# Patient Record
Sex: Male | Born: 1960 | Race: White | Hispanic: No | Smoking: Former smoker
Health system: Southern US, Community
[De-identification: ages and names within clinical notes are randomized; demographics above are authoritative.]

## PROBLEM LIST (undated history)

## (undated) DIAGNOSIS — E1142 Type 2 diabetes mellitus with diabetic polyneuropathy: Secondary | ICD-10-CM

## (undated) DIAGNOSIS — E119 Type 2 diabetes mellitus without complications: Secondary | ICD-10-CM

## (undated) DIAGNOSIS — I10 Essential (primary) hypertension: Secondary | ICD-10-CM

## (undated) DIAGNOSIS — I639 Cerebral infarction, unspecified: Secondary | ICD-10-CM

## (undated) DIAGNOSIS — I2510 Atherosclerotic heart disease of native coronary artery without angina pectoris: Secondary | ICD-10-CM

## (undated) DIAGNOSIS — E11319 Type 2 diabetes mellitus with unspecified diabetic retinopathy without macular edema: Secondary | ICD-10-CM

## (undated) DIAGNOSIS — E785 Hyperlipidemia, unspecified: Secondary | ICD-10-CM

## (undated) HISTORY — DX: Type 2 diabetes mellitus with unspecified diabetic retinopathy without macular edema: E11.319

## (undated) HISTORY — DX: Type 2 diabetes mellitus with diabetic polyneuropathy: E11.42

## (undated) HISTORY — PX: EYE SURGERY: SHX253

## (undated) HISTORY — PX: KNEE SURGERY: SHX244

## (undated) HISTORY — PX: CARDIAC SURGERY: SHX584

## (undated) HISTORY — DX: Hyperlipidemia, unspecified: E78.5

## (undated) HISTORY — DX: Essential (primary) hypertension: I10

## (undated) HISTORY — PX: CORONARY ARTERY BYPASS GRAFT: SHX141

## (undated) HISTORY — DX: Type 2 diabetes mellitus without complications: E11.9

## (undated) HISTORY — PX: TONSILLECTOMY: SUR1361

## (undated) HISTORY — DX: Cerebral infarction, unspecified: I63.9

---

## 1998-08-22 ENCOUNTER — Encounter: Admission: RE | Admit: 1998-08-22 | Discharge: 1998-11-20 | Payer: Self-pay | Admitting: Endocrinology

## 2010-02-23 ENCOUNTER — Encounter: Admission: RE | Admit: 2010-02-23 | Discharge: 2010-02-23 | Payer: Self-pay | Admitting: Family Medicine

## 2010-11-14 ENCOUNTER — Encounter: Payer: Self-pay | Admitting: Emergency Medicine

## 2010-11-14 ENCOUNTER — Inpatient Hospital Stay (INDEPENDENT_AMBULATORY_CARE_PROVIDER_SITE_OTHER)
Admission: RE | Admit: 2010-11-14 | Discharge: 2010-11-14 | Disposition: A | Discharge status: Home / Self Care | Payer: 59 | Source: Ambulatory Visit | Attending: Emergency Medicine | Admitting: Emergency Medicine

## 2010-11-14 DIAGNOSIS — L02519 Cutaneous abscess of unspecified hand: Secondary | ICD-10-CM

## 2010-11-14 DIAGNOSIS — E119 Type 2 diabetes mellitus without complications: Secondary | ICD-10-CM | POA: Insufficient documentation

## 2010-11-14 DIAGNOSIS — L03119 Cellulitis of unspecified part of limb: Secondary | ICD-10-CM

## 2010-11-14 DIAGNOSIS — E109 Type 1 diabetes mellitus without complications: Secondary | ICD-10-CM | POA: Insufficient documentation

## 2010-11-17 ENCOUNTER — Telehealth (INDEPENDENT_AMBULATORY_CARE_PROVIDER_SITE_OTHER): Payer: Self-pay

## 2011-03-11 NOTE — Telephone Encounter (Signed)
  Phone Note Outgoing Call   Call placed by: Linton Flemings RN,  November 17, 2010 3:34 PM Call placed to: Patient Summary of Call: f/u call w/out answe; msg left to call facility with questions/concerns

## 2011-03-11 NOTE — Progress Notes (Signed)
Summary: ? INFX IN FINGER   Vital Signs:  Patient Profile:   50 Years Old Male CC:      LT hand injury x 1 day ago Weight:      181.75 pounds O2 Sat:      95 % O2 treatment:    Room Air Temp:     98.8 degrees F oral Pulse rate:   93 / minute Resp:     18 per minute BP sitting:   158 / 77  (left arm) Cuff size:   regular  Vitals Entered By: Clemens Catholic LPN (November 14, 2010 7:47 PM)                  Updated Prior Medication List: HUMULIN 70/30 70-30 % SUSP (INSULIN ISOPHANE & REGULAR)   Current Allergies: ! CODEINE ! PENICILLINHistory of Present Illness History from: patient Chief Complaint: LT hand injury x 1 day ago History of Present Illness: Pt complains of  L hand pain. Accidentally cut tiny area on back of L hand on a corner of intact piece of glass yesterday. Hand became swollen, red, sore, slight yellow drainage today. No fever or n or vom. Location: dorsum L hand Onset:yesterday Description/Quality of Pain:mild , dull  Trauma: see above.  Last tetanus shot 4 months ago. No numbness.  PMH : DM, controlled. Recent CBG today fasting 135.   REVIEW OF SYSTEMS Constitutional Symptoms      Denies fever, chills, night sweats, weight loss, weight gain, and fatigue.  Eyes       Complains of eye surgery.      Denies change in vision, eye pain, eye discharge, glasses, and contact lenses. Ear/Nose/Throat/Mouth       Denies hearing loss/aids, change in hearing, ear pain, ear discharge, dizziness, frequent runny nose, frequent nose bleeds, sinus problems, sore throat, hoarseness, and tooth pain or bleeding.  Respiratory       Denies dry cough, productive cough, wheezing, shortness of breath, asthma, bronchitis, and emphysema/COPD.  Cardiovascular       Denies murmurs, chest pain, and tires easily with exhertion.    Gastrointestinal       Denies stomach pain, nausea/vomiting, diarrhea, constipation, blood in bowel movements, and indigestion. Genitourniary  Denies painful urination, kidney stones, and loss of urinary control. Neurological       Denies paralysis, seizures, and fainting/blackouts. Musculoskeletal       Denies muscle pain, joint pain, joint stiffness, decreased range of motion, redness, swelling, muscle weakness, and gout.  Skin       Denies bruising, unusual mles/lumps or sores, and hair/skin or nail changes.  Psych       Denies mood changes, temper/anger issues, anxiety/stress, speech problems, depression, and sleep problems. Other Comments: pt states that he cut his LT hand on a piece of glass yesterday. today it is swollen, oozing and red. fasting blood sugar this morning per pt 135.   Past History:  Past Medical History: Diabetes mellitus, type II  Past Surgical History: RT eye surgery 11/11'  Family History: none  Social History: Never Smoked Alcohol use-no Drug use-no Smoking Status:  never Drug Use:  no Physical Exam General appearance: well developed, well nourished, no acute distress Head: normocephalic, atraumatic Oral/Pharynx: tongue normal, posterior pharynx without erythema or exudate Chest/Lungs: no rales, wheezes, or rhonchi bilateral, breath sounds equal without effort Heart: regular rate and  rhythm, no murmur Neurological: grossly intact and non-focal L hand: moderately swollen, red, indurated 3 x 3 cm  arae without fluctuence. Tiny, healing puncture wound. in center of area. No significant ttp. No fb seen or palpated. No drainage. No bleeding. No bony ttp. No red streaks. Distally, neurovacs and cap rf normal. No edema of fingers.  Assessment New Problems: CELLULITIS, HAND, LEFT (ICD-682.4) DIABETES MELLITUS, TYPE II (ICD-250.00)  No evidence of FB or abcess or vascular compromise. Given hx DM, will tx aggressively with 2 abx's with close f/u with his pcp.  Plan New Medications/Changes: CIPRO 500 MG TABS (CIPROFLOXACIN HCL) 1 by mouth two times a day for 10 days  #20 x 0, 11/14/2010, Lajean Manes MD DOXYCYCLINE HYCLATE 100 MG CAPS (DOXYCYCLINE HYCLATE) 1 by mouth two times a day for 10 days  #20 x 0, 11/14/2010, Lajean Manes MD  New Orders: Est. Patient Level III [phone] Services provided After hours-Weekends-Holidays [99051] Planning Comments:   Discussed dx and tx at length with pt. Option of IM Rocephin discussed, with risks, given hx of PCN allergy. Risks, benefits, alternatives discussed. Pt refused Rocephin IM. He agreed to the 2 abx's as prescribed. Wound area cleaned with antimicrobial soap, dressing appilied. Wound care instructed. 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  Follow Up: Follow up with Primary Physician Follow Up: 1-2 days. (I Explained risks if he were to delay f/u)  The patient and/or caregiver has been counseled thoroughly with regard to medications prescribed including dosage, schedule, interactions, rationale for use, and possible side effects and they verbalize understanding.  Diagnoses and expected course of recovery discussed and will return if not improved as expected or if the condition worsens. Patient and/or caregiver verbalized understanding.  Prescriptions: CIPRO 500 MG TABS (CIPROFLOXACIN HCL) 1 by mouth two times a day for 10 days  #20 x 0   Entered and Authorized by:   Lajean Manes MD   Signed by:   Lajean Manes MD on 11/14/2010   Method used:   Electronically to        [facility] [phone]* (retail)       [address] [place], [place]  [address]       Ph: [phone]       Fax: [phone]   RxID:   [id number] DOXYCYCLINE HYCLATE 100 MG CAPS (DOXYCYCLINE HYCLATE) 1 by mouth two times a day for 10 days  #20 x 0   Entered and Authorized by:   Lajean Manes MD   Signed by:   Lajean Manes MD on 11/14/2010   Method used:   Electronically to        [facility] [phone]* (retail)       [address] [place], [place]  [address]       Ph: [phone]       Fax: [phone]   RxID:   [id number]   Orders  Added: 1)  Est. Patient Level III [10932] 2)  Services provided After hours-Weekends-Holidays [[id number]]

## 2011-09-23 IMAGING — CR DG FOOT COMPLETE 3+V*R*
3 series · 3 of 3 positions shown · non-contrast
Comparison: None.

CLINICAL DATA: Lateral foot pain.

RIGHT FOOT COMPLETE - 3+ VIEW

[view not recorded (1 of 3)]
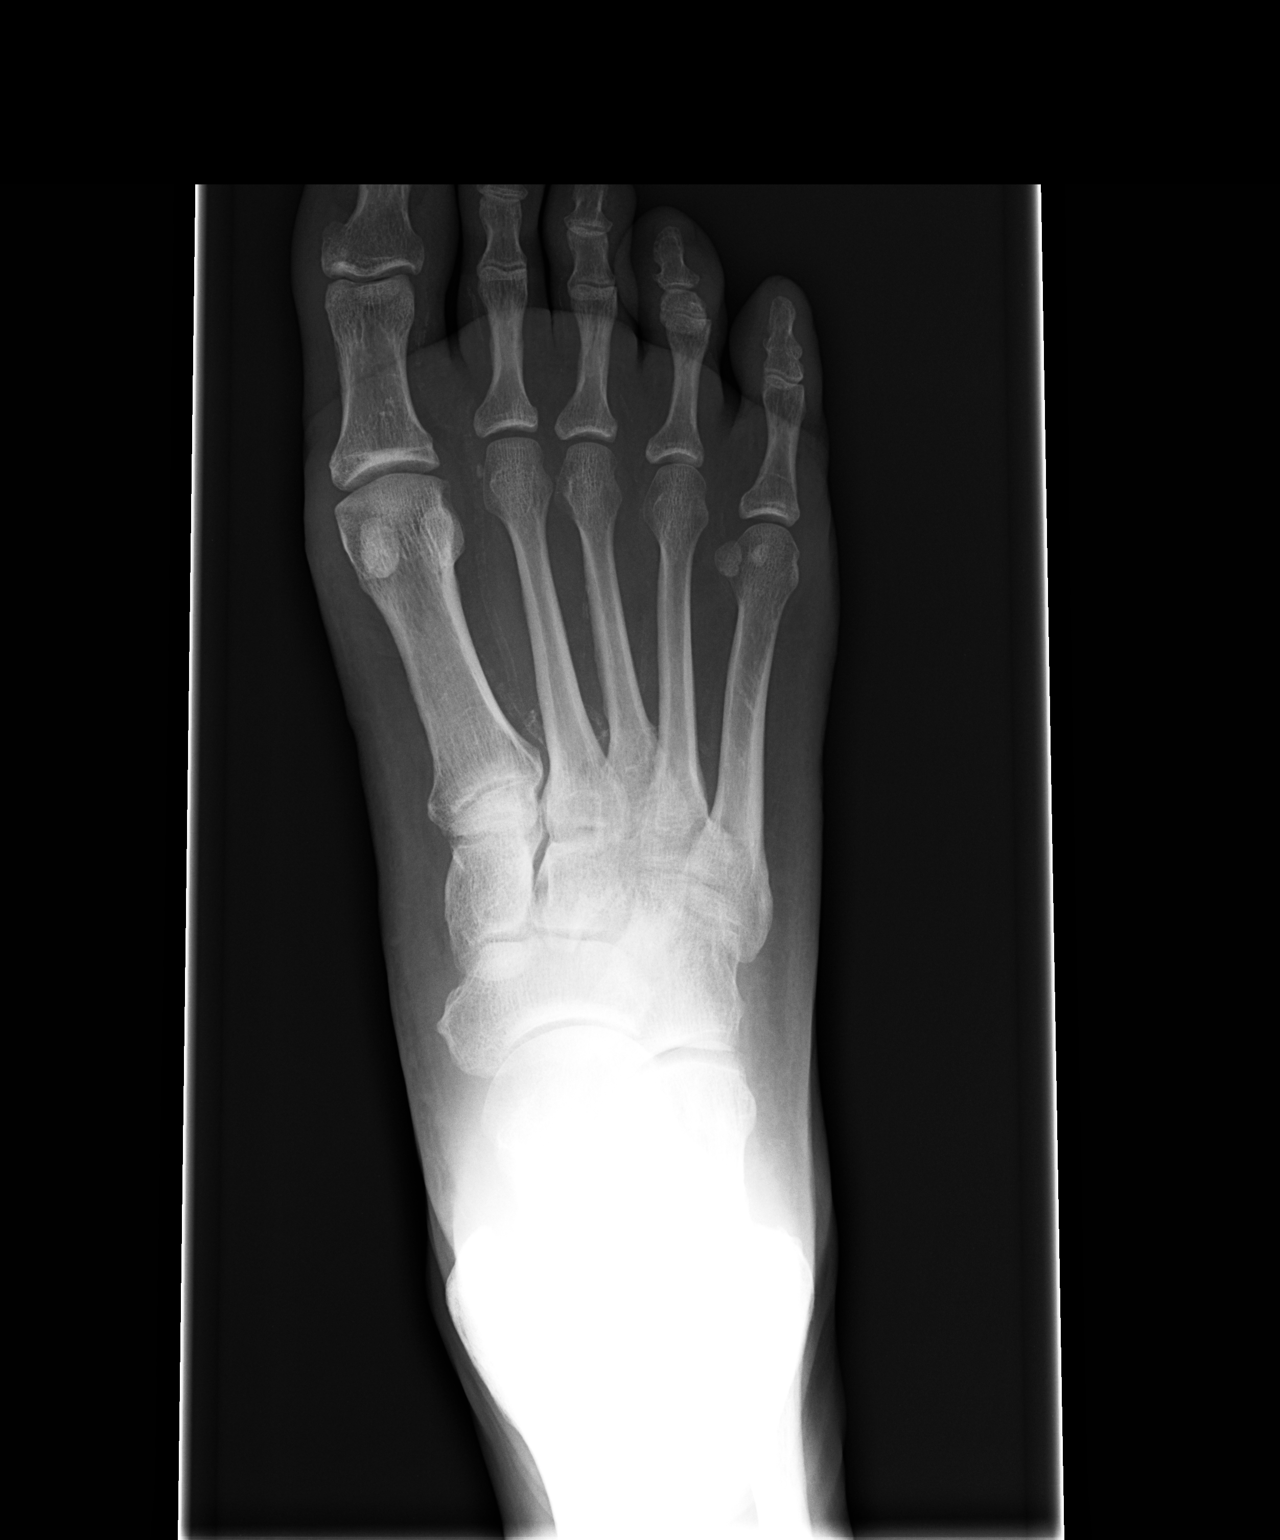

[view not recorded (2 of 3)]
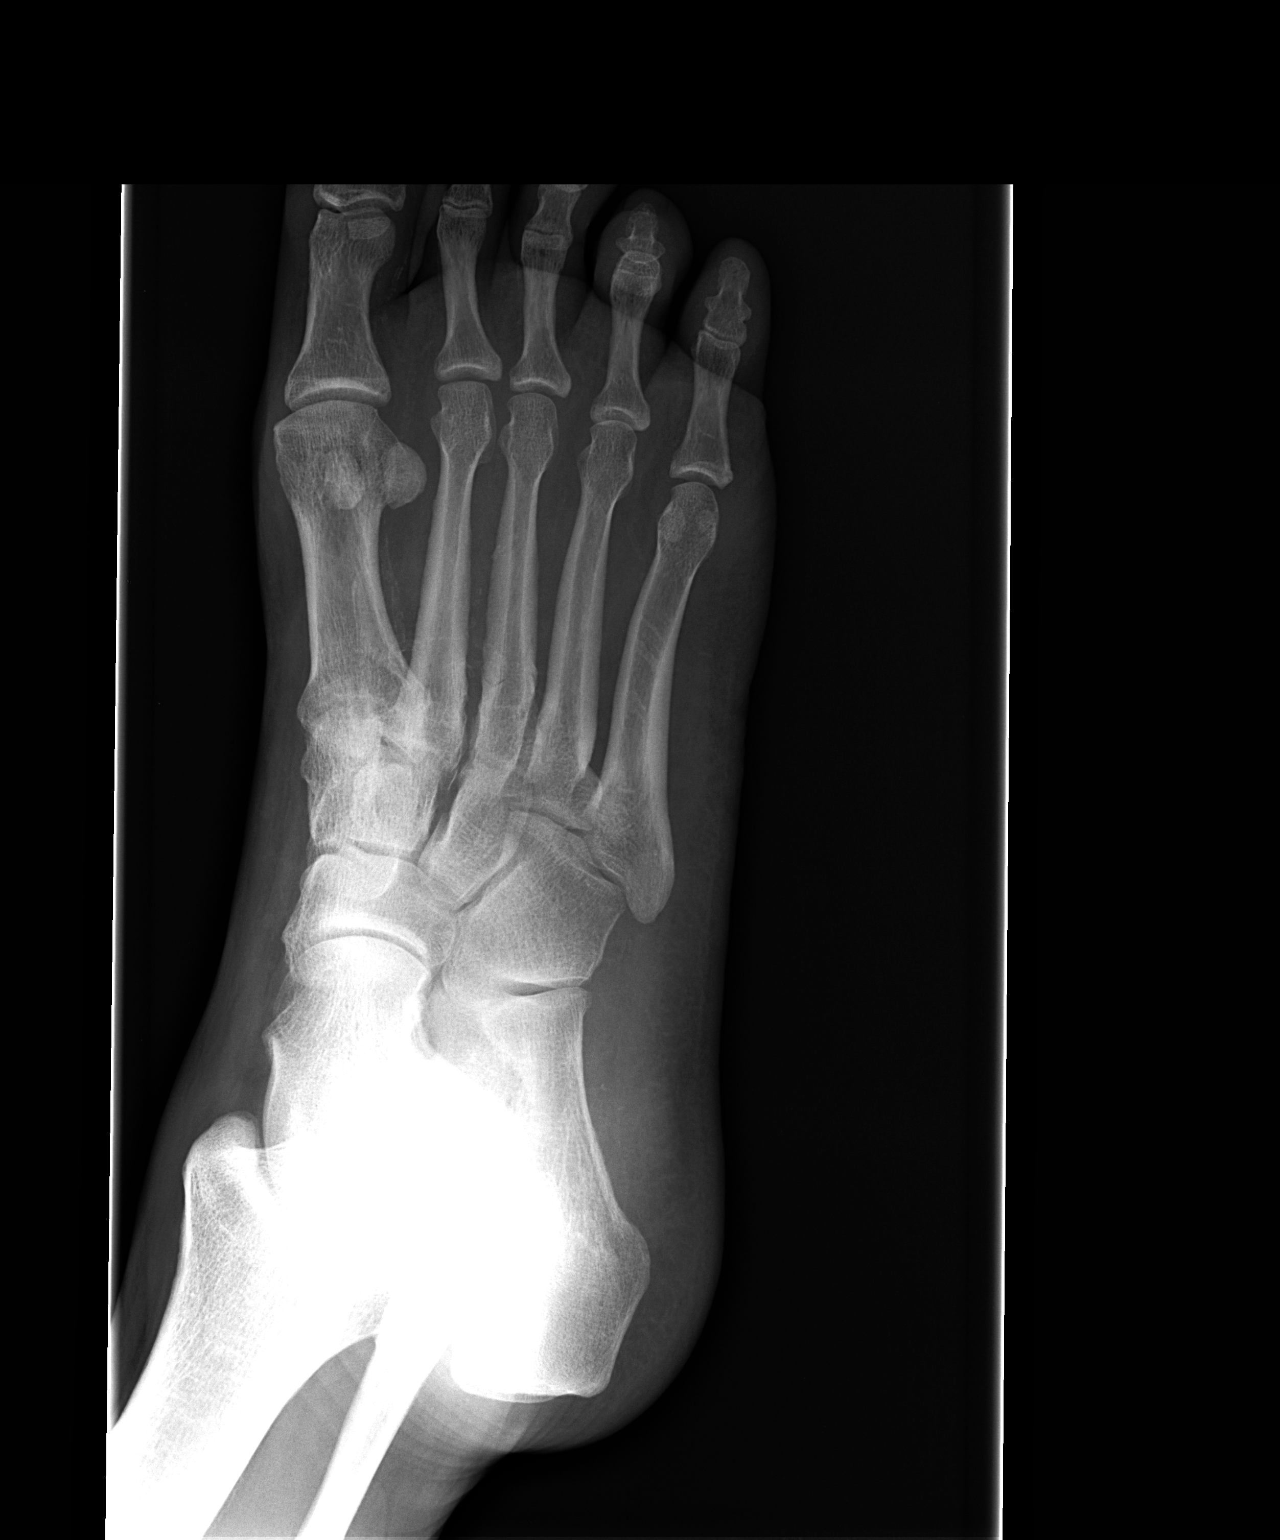

[view not recorded (3 of 3)]
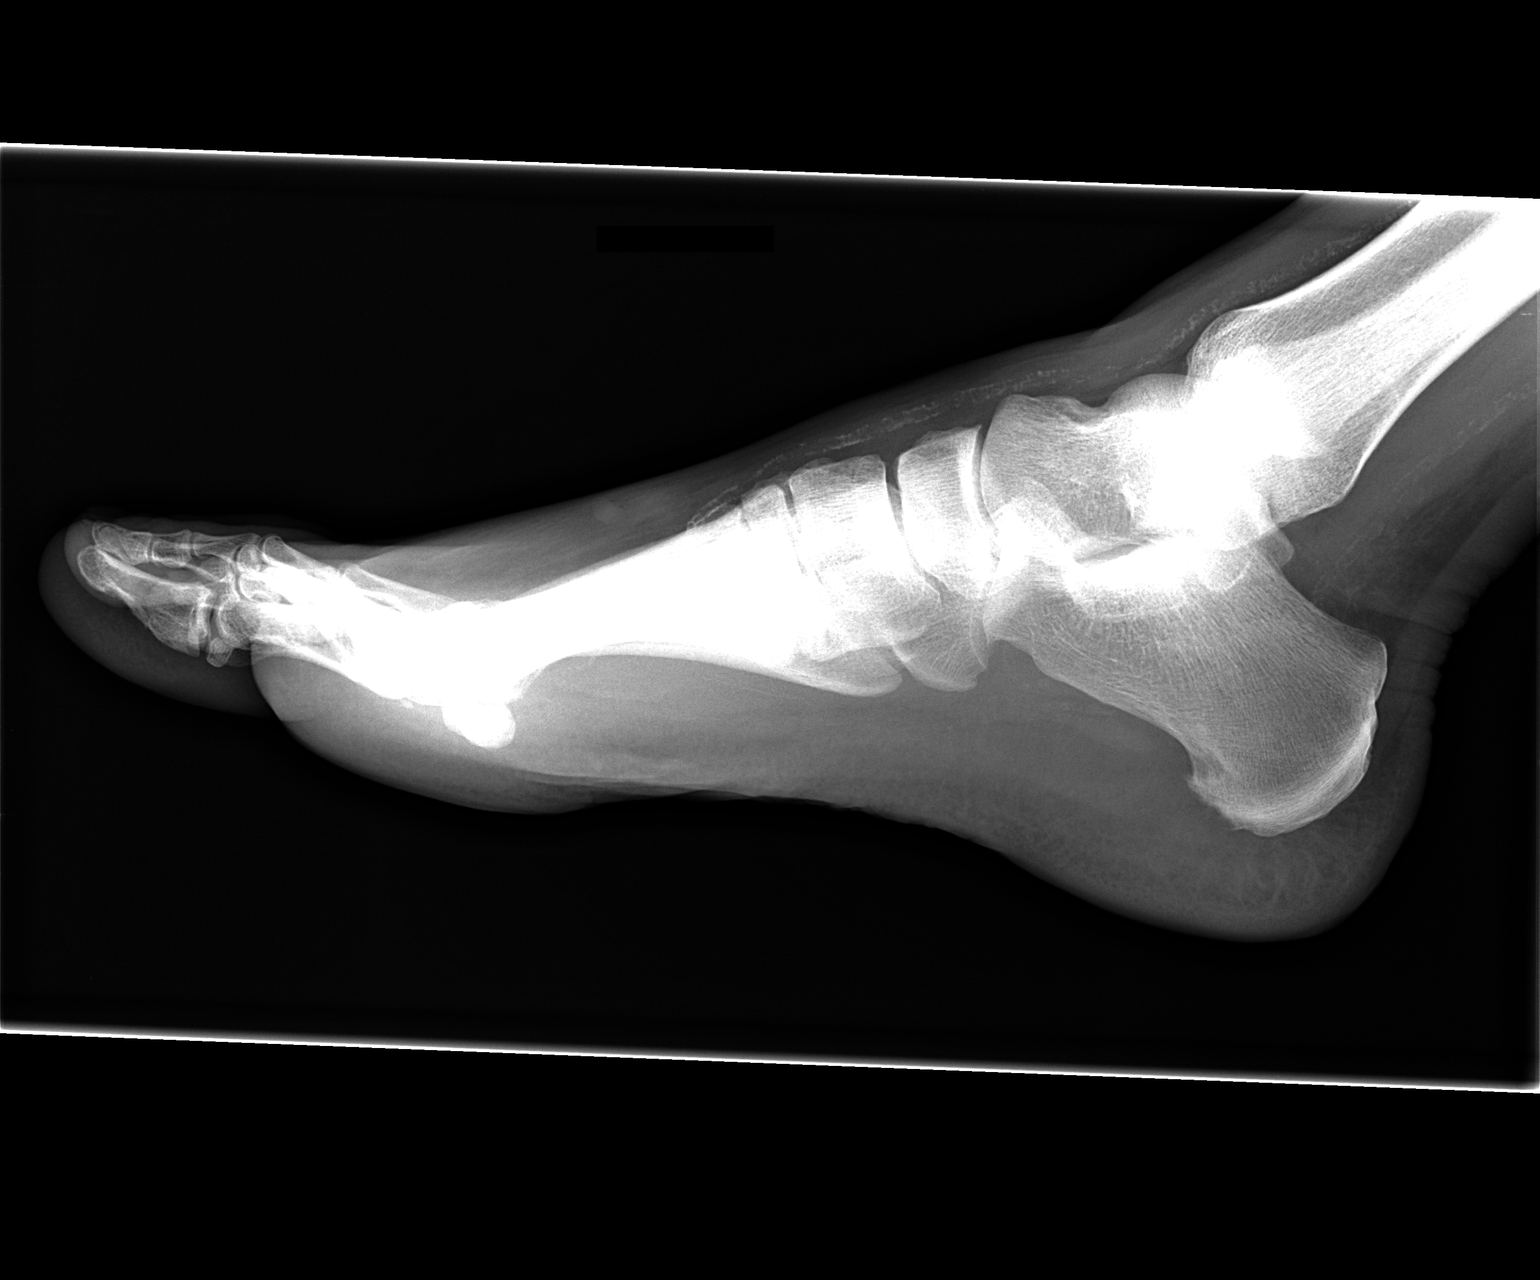

[3 of 3 positions shown; findings below may reference images not displayed]

FINDINGS: Curvilinear lucency is seen in the proximal shaft of the
third metatarsal, on one view only.  No evidence of additional
fracture.  Tiny calcaneal spur.  Vascular calcifications.
IMPRESSION: Question nondisplaced fracture along the proximal shaft of the
third metatarsal.  Please correlate clinically.

## 2013-02-10 ENCOUNTER — Encounter: Payer: Self-pay | Admitting: Emergency Medicine

## 2013-02-10 ENCOUNTER — Emergency Department (INDEPENDENT_AMBULATORY_CARE_PROVIDER_SITE_OTHER)
Admission: EM | Admit: 2013-02-10 | Discharge: 2013-02-10 | Disposition: A | Discharge status: Home / Self Care | Payer: 59 | Source: Home / Self Care | Attending: Family Medicine | Admitting: Family Medicine

## 2013-02-10 DIAGNOSIS — S61452A Open bite of left hand, initial encounter: Secondary | ICD-10-CM

## 2013-02-10 DIAGNOSIS — L02519 Cutaneous abscess of unspecified hand: Secondary | ICD-10-CM

## 2013-02-10 HISTORY — DX: Type 2 diabetes mellitus without complications: E11.9

## 2013-02-10 LAB — POCT CBC W AUTO DIFF (K'VILLE URGENT CARE)

## 2013-02-10 MED ORDER — DOXYCYCLINE HYCLATE 100 MG PO CAPS: 100.0000 mg | ORAL_CAPSULE | Freq: Two times a day (BID) | ORAL | Status: DC

## 2013-02-10 NOTE — ED Provider Notes (Signed)
CSN: [id number]     Arrival date & time 02/10/13  1801 History   First MD Initiated Contact with Patient 02/10/13 1849     Chief Complaint  Patient presents with  . Animal Bite      HPI Comments: Patient's cat bit him on his left hand 3 days ago.  The cat was not ill and immunizations current.  Patient's left hand has become increasingly tender and today he had difficulty moving his thumb and wrist.  No fevers, chills, and sweats.  Last Tdap one year ago.  Patient is a 52 y.o. male presenting with animal bite. The history is provided by the patient.  Animal Bite Contact animal:  Cat Location:  Hand Hand injury location:  L hand Time since incident:  3 days Pain details:    Quality:  Aching   Severity:  Moderate   Progression:  Worsening Incident location:  Home Provoked: provoked   Animal's rabies vaccination status:  Up to date Animal in possession: yes   Tetanus status:  Up to date Relieved by:  Nothing Worsened by:  Activity Ineffective treatments:  None tried Associated symptoms: swelling   Associated symptoms: no fever, no numbness and no rash     Past Medical History  Diagnosis Date  . Diabetes mellitus without complication    Past Surgical History  Procedure Laterality Date  . Tonsillectomy     History reviewed. No pertinent family history. History  Substance Use Topics  . Smoking status: Former Games developer  . Smokeless tobacco: Never Used  . Alcohol Use: No    Review of Systems  Constitutional: Negative for fever.  Skin: Negative for rash.  Neurological: Negative for numbness.  All other systems reviewed and are negative.    Allergies  Codeine and Penicillins  Home Medications   Current Outpatient Rx  Name  Route  Sig  Dispense  Refill  . insulin aspart (NOVOLOG) 100 UNIT/ML injection   Subcutaneous   Inject into the skin 3 (three) times daily before meals.         Marland Kitchen doxycycline (VIBRAMYCIN) 100 MG capsule   Oral   Take 1 capsule (100 mg total)  by mouth 2 (two) times daily.   20 capsule   0    BP 186/92  Pulse 94  Temp(Src) 98.6 F (37 C) (Oral)  Ht 5\' 11"  (1.803 m)  Wt 192 lb (87.091 kg)  BMI 26.79 kg/m2  SpO2 98% Physical Exam  Nursing note and vitals reviewed. Constitutional: He is oriented to person, place, and time. He appears well-developed and well-nourished. No distress.  HENT:  Head: Atraumatic.  Mouth/Throat: Oropharynx is clear and moist.  Eyes: Conjunctivae are normal. Pupils are equal, round, and reactive to light.  Neck: Neck supple.  Cardiovascular: Normal heart sounds.   Pulmonary/Chest: Breath sounds normal.  Musculoskeletal:       Left hand: He exhibits decreased range of motion, tenderness and swelling. He exhibits no bony tenderness, normal two-point discrimination, normal capillary refill, no deformity and no laceration. Normal sensation noted. Normal strength noted.       Hands: Patient has tenderness, erythema, and mild swelling over the left thenar eminence.  Evidence of a healed puncture wound at the site.  Area not fluctuant.  Range of motion thumb and wrist decreased.  Distal neurovascular function is intact.  There is faint ascending lymphangitis on volar left forearm.  Lymphadenopathy:    He has no cervical adenopathy.    He has no axillary adenopathy.  Left: No epitrochlear adenopathy present.  Neurological: He is alert and oriented to person, place, and time.  Skin: Skin is warm and dry.    ED Course  Procedures  none    Labs Reviewed  POCT CBC W AUTO DIFF ([facility])  WBC 14.7; LY 16.6; MO 2.1; GR 81.3; Hgb 15.1; Platelets 289          MDM   1. CELLULITIS, HAND, LEFT   2. Cat bite of hand, left, initial encounter    Begin doxycycline to cover Pasteurella Multocida (note allergy to penicillin) Elevate hand/arm.  Begin warm soaks or compresses.  May take Tylenol for pain. Recommend follow-up if not improved 48 hours. If symptoms become significantly worse  during the night or over the weekend, proceed to the local emergency room    Lattie Haw, MD 02/10/13 1927

## 2013-02-10 NOTE — ED Notes (Signed)
Patient bitten by his cat, all shots UTD, 3 days ago. He denies fever or chills. Pain and swelling with redness present, site is starting to streak.

## 2013-09-29 ENCOUNTER — Emergency Department
Admission: EM | Admit: 2013-09-29 | Discharge: 2013-09-29 | Discharge status: Absent without leave | Payer: Self-pay | Source: Home / Self Care

## 2017-04-24 DIAGNOSIS — N529 Male erectile dysfunction, unspecified: Secondary | ICD-10-CM | POA: Insufficient documentation

## 2018-06-13 DIAGNOSIS — I639 Cerebral infarction, unspecified: Secondary | ICD-10-CM | POA: Insufficient documentation

## 2018-06-14 DIAGNOSIS — I280 Arteriovenous fistula of pulmonary vessels: Secondary | ICD-10-CM | POA: Insufficient documentation

## 2018-06-14 DIAGNOSIS — I10 Essential (primary) hypertension: Secondary | ICD-10-CM | POA: Insufficient documentation

## 2018-06-14 DIAGNOSIS — E785 Hyperlipidemia, unspecified: Secondary | ICD-10-CM | POA: Insufficient documentation

## 2018-06-14 DIAGNOSIS — Z720 Tobacco use: Secondary | ICD-10-CM | POA: Insufficient documentation

## 2018-06-14 DIAGNOSIS — E1169 Type 2 diabetes mellitus with other specified complication: Secondary | ICD-10-CM | POA: Insufficient documentation

## 2018-06-15 DIAGNOSIS — I214 Non-ST elevation (NSTEMI) myocardial infarction: Secondary | ICD-10-CM | POA: Insufficient documentation

## 2018-06-15 DIAGNOSIS — I2510 Atherosclerotic heart disease of native coronary artery without angina pectoris: Secondary | ICD-10-CM | POA: Insufficient documentation

## 2018-06-15 DIAGNOSIS — I255 Ischemic cardiomyopathy: Secondary | ICD-10-CM | POA: Insufficient documentation

## 2018-09-03 DIAGNOSIS — I2510 Atherosclerotic heart disease of native coronary artery without angina pectoris: Secondary | ICD-10-CM | POA: Insufficient documentation

## 2018-09-13 ENCOUNTER — Other Ambulatory Visit: Payer: Self-pay

## 2018-09-13 ENCOUNTER — Emergency Department: Payer: 59

## 2018-09-13 ENCOUNTER — Emergency Department
Admission: EM | Admit: 2018-09-13 | Discharge: 2018-09-13 | Discharge status: Against Medical Advice | Payer: 59 | Attending: Emergency Medicine | Admitting: Emergency Medicine

## 2018-09-13 ENCOUNTER — Encounter: Payer: Self-pay | Admitting: Emergency Medicine

## 2018-09-13 DIAGNOSIS — I2510 Atherosclerotic heart disease of native coronary artery without angina pectoris: Secondary | ICD-10-CM | POA: Insufficient documentation

## 2018-09-13 DIAGNOSIS — Z79899 Other long term (current) drug therapy: Secondary | ICD-10-CM | POA: Insufficient documentation

## 2018-09-13 DIAGNOSIS — J90 Pleural effusion, not elsewhere classified: Secondary | ICD-10-CM | POA: Insufficient documentation

## 2018-09-13 DIAGNOSIS — Z7982 Long term (current) use of aspirin: Secondary | ICD-10-CM | POA: Insufficient documentation

## 2018-09-13 DIAGNOSIS — E119 Type 2 diabetes mellitus without complications: Secondary | ICD-10-CM | POA: Diagnosis not present

## 2018-09-13 DIAGNOSIS — R319 Hematuria, unspecified: Secondary | ICD-10-CM

## 2018-09-13 DIAGNOSIS — Z87891 Personal history of nicotine dependence: Secondary | ICD-10-CM | POA: Diagnosis not present

## 2018-09-13 DIAGNOSIS — Z7902 Long term (current) use of antithrombotics/antiplatelets: Secondary | ICD-10-CM | POA: Insufficient documentation

## 2018-09-13 HISTORY — DX: Atherosclerotic heart disease of native coronary artery without angina pectoris: I25.10

## 2018-09-13 LAB — CBC
HCT: 33.5 % — ABNORMAL LOW (ref 39.0–52.0)
Hemoglobin: 11.1 g/dL — ABNORMAL LOW (ref 13.0–17.0)
MCH: 28.3 pg (ref 26.0–34.0)
MCHC: 33.1 g/dL (ref 30.0–36.0)
MCV: 85.5 fL (ref 80.0–100.0)
Platelets: 582 10*3/uL — ABNORMAL HIGH (ref 150–400)
RBC: 3.92 MIL/uL — ABNORMAL LOW (ref 4.22–5.81)
RDW: 13.2 % (ref 11.5–15.5)
WBC: 14.9 10*3/uL — ABNORMAL HIGH (ref 4.0–10.5)
nRBC: 0 % (ref 0.0–0.2)

## 2018-09-13 LAB — URINALYSIS, COMPLETE (UACMP) WITH MICROSCOPIC
Bilirubin Urine: NEGATIVE
Glucose, UA: NEGATIVE mg/dL
Ketones, ur: NEGATIVE mg/dL
Leukocytes,Ua: NEGATIVE
Nitrite: NEGATIVE
Protein, ur: 30 mg/dL — AB
RBC / HPF: >50 RBC/hpf — ABNORMAL HIGH (ref 0–5)
Specific Gravity, Urine: 1.026 (ref 1.005–1.030)
pH: 5 (ref 5.0–8.0)

## 2018-09-13 LAB — BASIC METABOLIC PANEL
Anion gap: 8 (ref 5–15)
BUN: 21 mg/dL — ABNORMAL HIGH (ref 6–20)
CO2: 25 mmol/L (ref 22–32)
Calcium: 8.5 mg/dL — ABNORMAL LOW (ref 8.9–10.3)
Chloride: 102 mmol/L (ref 98–111)
Creatinine, Ser: 0.85 mg/dL (ref 0.61–1.24)
GFR calc Af Amer: >60 mL/min (ref >60)
GFR calc non Af Amer: >60 mL/min (ref >60)
Glucose, Bld: 192 mg/dL — ABNORMAL HIGH (ref 70–99)
Potassium: 4.4 mmol/L (ref 3.5–5.1)
Sodium: 135 mmol/L (ref 135–145)

## 2018-09-13 LAB — GLUCOSE, CAPILLARY: Glucose-Capillary: 184 mg/dL — ABNORMAL HIGH (ref 70–99)

## 2018-09-13 NOTE — ED Triage Notes (Signed)
Pt to ED via POV c/o Hematuria x 1 day. Pt denies pain in the bladder area. Pt denies passing blood clots, pt states that his urine is reddish brown. Pt recently had Open Heart surgery on 09/03/18, pt did have foley in place for 1 day after surgery. Pt is in NAD at this time.

## 2018-09-13 NOTE — ED Notes (Signed)
Pt given a meal tray and diet coke.

## 2018-09-13 NOTE — ED Notes (Signed)
Registration staff came to first nurse desk and stated that pt fiance said that pts blood sugar was dropping. Pt states that he is diabetic and takes insulin. Pt last ate around 1130. RN checked pts CBG. Pts CBG 184. Pt states that he does not feel like his blood sugar is dropping.

## 2018-09-13 NOTE — ED Provider Notes (Signed)
[facility] Emergency Department Provider Note  ____________________________________________  Time seen: Approximately 4:40 PM  I have reviewed the triage vital signs and the nursing notes.   HISTORY  Chief Complaint Hematuria    HPI Bruce Lucas is a 58 y.o. 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  He denies any pain, no dysuria frequency urgency or urinary retention.  No abdominal pain.  No chest pain shortness of breath fevers chills or body aches.  No cough.  No exertional symptoms.  He was started on Eliquis about a week ago which he has been taking along with his other medications.  He is also on full dose aspirin daily.      Past Medical History:  Diagnosis Date  . Coronary artery disease   . Diabetes mellitus without complication [facility])      Patient Active Problem List   Diagnosis Date Noted  . DIABETES MELLITUS, TYPE II 11/14/2010  . CELLULITIS, HAND, LEFT 11/14/2010     Past Surgical History:  Procedure Laterality Date  . CARDIAC SURGERY    . KNEE SURGERY Bilateral   . TONSILLECTOMY       Prior to Admission medications   Medication Sig Start Date End Date Taking? Authorizing Provider  amiodarone (PACERONE) 200 MG tablet Take 200 mg by mouth daily. 09/08/18  Yes [provider]  aspirin 81 MG chewable tablet Chew 324 mg by mouth daily. 06/21/18 06/21/19 Yes [provider]  atorvastatin (LIPITOR) 40 MG tablet Take 40 mg by mouth daily. 09/08/18  Yes [provider]  clopidogrel (PLAVIX) 75 MG tablet Take 75 mg by mouth daily. 07/25/18  Yes [provider]  ELIQUIS 5 MG TABS tablet Take 5 mg by mouth 2 (two) times daily. 09/08/18  Yes [provider]  famotidine (PEPCID) 20 MG tablet Take 20 mg by mouth 2 (two) times daily. 09/08/18 10/08/18 Yes  [provider]  HUMALOG KWIKPEN 100 UNIT/ML KwikPen Inject 0-14 Units into the skin 3 (three) times daily with meals. 07/10/18  Yes [provider]  Insulin Glargine (BASAGLAR KWIKPEN) 100 UNIT/ML SOPN Inject 28 Units into the skin at bedtime. 08/21/18  Yes [provider]  isosorbide mononitrate (IMDUR) 30 MG 24 hr tablet Take 30 mg by mouth daily. 08/27/18  Yes [provider]  losartan (COZAAR) 25 MG tablet Take 25 mg by mouth daily. 08/13/18  Yes [provider]  Melatonin 5 MG TABS Take 5 mg by mouth at bedtime.   Yes [provider]  metoprolol succinate (TOPROL-XL) 50 MG 24 hr tablet Take 50 mg by mouth daily. 08/27/18  Yes [provider]  nitroGLYCERIN (NITROSTAT) 0.4 MG SL tablet Place 0.4 mg under the tongue every 5 (five) minutes x 3 doses as needed for chest pain. 07/13/18  Yes [provider]  NOVOLOG FLEXPEN 100 UNIT/ML FlexPen Inject 0-14 Units into the skin 3 (three) times daily with meals. 08/21/18  Yes [provider]  oxyCODONE (OXY IR/ROXICODONE) 5 MG immediate release tablet Take 5 mg by mouth 3 (three) times daily as needed for pain. 09/08/18  Yes [provider]  sertraline (ZOLOFT) 50 MG tablet Take 50 mg by mouth daily. 08/27/18  Yes [provider]     Allergies Codeine and Penicillins   No family history on file.  Social History Social History   Tobacco Use  . Smoking status: Former Research scientist (life sciences)  . Smokeless tobacco: Never Used  Substance Use Topics  . Alcohol use: No  . Drug use: No    Review of Systems  Constitutional:   No fever or chills.  ENT:   No sore throat. No rhinorrhea. Cardiovascular:   No chest pain or syncope. Respiratory:   No dyspnea or cough. Gastrointestinal:   Negative for abdominal pain, vomiting and diarrhea.  Musculoskeletal:   Negative for focal pain or swelling All other systems reviewed and are negative except as documented above in ROS and  HPI.  ____________________________________________   PHYSICAL EXAM:  VITAL SIGNS: ED Triage Vitals  Enc Vitals Group     BP 09/13/18 1345 120/60     Pulse Rate 09/13/18 1345 100     Resp 09/13/18 1345 16     Temp 09/13/18 1345 98 F (36.7 C)     Temp Source 09/13/18 1345 Oral     SpO2 09/13/18 1345 100 %     Weight --      Height --      Head Circumference --      Peak Flow --      Pain Score 09/13/18 1349 0     Pain Loc --      Pain Edu? --      Excl. in [place]? --     Vital signs reviewed, nursing assessments reviewed.   Constitutional:   Alert and oriented. Non-toxic appearance. Eyes:   Conjunctivae are normal. EOMI. PERRL. ENT      Head:   Normocephalic and atraumatic.        Neck:   No meningismus. Full ROM. Hematological/Lymphatic/Immunilogical:   No cervical lymphadenopathy. Cardiovascular:   RRR. Symmetric bilateral radial and DP pulses.  No murmurs. Cap refill less than 2 seconds. Respiratory:   Normal respiratory effort without tachypnea/retractions.  Absent breath sounds at left lower chest compared to right. Gastrointestinal:   Soft and nontender. Non distended. There is no CVA tenderness.  No rebound, rigidity, or guarding.  Musculoskeletal:   Normal range of motion in all extremities. No joint effusions.  No lower extremity tenderness.  No edema.  Right thigh incision for greater saphenous vein harvest is well-healed.  There is ecchymosis of the thigh but symmetric circumference compared to left.  Tissues are soft without induration warmth erythema tenderness or hematoma.  Anterior chest wall sternotomy incision is healing well, nontender without drainage.  Chest tube exit sites also are healing well, no signs of infection or drainage. Neurologic:   Normal speech and language.  Motor grossly intact. No acute focal neurologic deficits are appreciated.  Skin:    Skin is warm, dry and intact. No rash noted.  No petechiae, purpura, or  bullae.  ____________________________________________    LABS (pertinent positives/negatives) (all labs ordered are listed, but only abnormal results are displayed) Labs Reviewed  BASIC METABOLIC PANEL - Abnormal; Notable for the following components:      Result Value   Glucose, Bld 192 (*)    BUN 21 (*)    Calcium 8.5 (*)    All other components within normal limits  CBC - Abnormal; Notable for the following components:   WBC 14.9 (*)    RBC 3.92 (*)    Hemoglobin 11.1 (*)    HCT 33.5 (*)    Platelets 582 (*)    All other components within normal limits  GLUCOSE, CAPILLARY - Abnormal; Notable  for the following components:   Glucose-Capillary 184 (*)    All other components within normal limits  URINE CULTURE  URINALYSIS, COMPLETE (UACMP) WITH MICROSCOPIC   ____________________________________________   EKG    ____________________________________________    RADIOLOGY  Dg Chest Portable 1 View  Result Date: 09/13/2018 CLINICAL DATA:  Recent CABG. Diminished breath sounds at the left lung base. EXAM: PORTABLE CHEST 1 VIEW COMPARISON:  None. FINDINGS: The heart size and mediastinal contours are within normal limits. Prior CABG. Normal pulmonary vascularity. Elevated left hemidiaphragm with small left pleural effusion and left basilar atelectasis. No pneumothorax. No acute osseous abnormality. IMPRESSION: 1. Left basilar atelectasis and effusion. Electronically Signed   By: Titus Dubin M.D.   On: 09/13/2018 16:16    ____________________________________________   PROCEDURES Procedures  ____________________________________________    CLINICAL IMPRESSION / ASSESSMENT AND PLAN / ED COURSE  Medications ordered in the ED: Medications - No data to display  Pertinent labs & imaging results that were available during my care of the patient were reviewed by me and considered in my medical decision making (see chart for details).  Bruce Lucas was evaluated in  Emergency Department on 09/13/2018 for the symptoms described in the history of present illness. He was evaluated in the context of the global COVID-19 pandemic, which necessitated consideration that the patient might be at risk for infection with the SARS-CoV-2 virus that causes COVID-19. Institutional protocols and algorithms that pertain to the evaluation of patients at risk for COVID-19 are in a state of rapid change based on information released by regulatory bodies including the CDC and federal and state organizations. These policies and algorithms were followed during the patient's care in the ED.   Patient presents with hematuria.  No pain, no fever, no other symptoms.  Hemoglobin today is 11 compared to a baseline of 10.  Labs are okay.  Chest x-ray obtained which shows elevated left diaphragm on the left as the cause for the absent breath sounds.  There is a small pleural effusion which is minimal on my viewing of the image.  Lung fields are otherwise okay.  While waiting for results, patient decided he was unwilling to wait any longer and demanded to be discharged right away.  He left AGAINST MEDICAL ADVICE, knowing that I was concerned that the bleeding may be result of infection in the bladder from his recent Foley catheter during surgery.  Advised him to continue the Eliquis for now due to his high risk for stroke with episodes of A. fib while in the hospital and prior stroke.  He understands the need to call his doctor tomorrow to follow-up.  Patient's urinalysis may have been canceled automatically by the lab system upon the patient's discharge from the hospital.      ____________________________________________   FINAL CLINICAL IMPRESSION(S) / ED DIAGNOSES    Final diagnoses:  Hematuria, unspecified type  Pleural effusion on left     ED Discharge Orders    None      Portions of this note were generated with dragon dictation software. Dictation errors may occur despite best  attempts at proofreading.   Carrie Mew, MD 09/13/18 [phone]

## 2018-09-15 LAB — URINE CULTURE: Culture: <10000 — AB

## 2018-10-07 DIAGNOSIS — Z0000 Encounter for general adult medical examination without abnormal findings: Secondary | ICD-10-CM | POA: Insufficient documentation

## 2018-10-30 ENCOUNTER — Other Ambulatory Visit: Payer: Self-pay

## 2018-10-30 ENCOUNTER — Encounter: Payer: 59 | Attending: Cardiology | Admitting: *Deleted

## 2018-10-30 DIAGNOSIS — Z951 Presence of aortocoronary bypass graft: Secondary | ICD-10-CM | POA: Insufficient documentation

## 2018-10-30 DIAGNOSIS — I25810 Atherosclerosis of coronary artery bypass graft(s) without angina pectoris: Secondary | ICD-10-CM | POA: Insufficient documentation

## 2018-10-30 DIAGNOSIS — I214 Non-ST elevation (NSTEMI) myocardial infarction: Secondary | ICD-10-CM | POA: Insufficient documentation

## 2018-10-30 DIAGNOSIS — E10319 Type 1 diabetes mellitus with unspecified diabetic retinopathy without macular edema: Secondary | ICD-10-CM | POA: Insufficient documentation

## 2018-10-30 NOTE — Progress Notes (Signed)
Virtual Orientation completed. Diagnosis can be found in CE 5/28. EP/RD orientation scheduled for 7/28 at 11.

## 2018-11-03 ENCOUNTER — Encounter: Payer: 59 | Admitting: *Deleted

## 2018-11-03 ENCOUNTER — Other Ambulatory Visit: Payer: Self-pay

## 2018-11-03 VITALS — Ht 69.4 in | Wt 178.6 lb

## 2018-11-03 DIAGNOSIS — I214 Non-ST elevation (NSTEMI) myocardial infarction: Secondary | ICD-10-CM | POA: Diagnosis not present

## 2018-11-03 DIAGNOSIS — Z951 Presence of aortocoronary bypass graft: Secondary | ICD-10-CM

## 2018-11-03 DIAGNOSIS — I25810 Atherosclerosis of coronary artery bypass graft(s) without angina pectoris: Secondary | ICD-10-CM | POA: Diagnosis not present

## 2018-11-03 NOTE — Progress Notes (Signed)
Cardiac Individual Treatment Plan  Patient Details  Name: Bruce Lucas MRN: [id number] Date of Birth: [birth year] Referring Provider:     Cardiac Rehab from 11/03/2018 in [facility] Cardiac and Pulmonary Rehab  Referring Provider  Celine Ahr MD      Initial Encounter Date:    Cardiac Rehab from 11/03/2018 in [facility] Cardiac and Pulmonary Rehab  Date  11/03/18      Visit Diagnosis: S/P CABG x 2  Patient's Home Medications on Admission:  Current Outpatient Medications:  .  amiodarone (PACERONE) 200 MG tablet, Take 200 mg by mouth daily., Disp: , Rfl:  .  aspirin 81 MG chewable tablet, Chew 324 mg by mouth daily., Disp: , Rfl:  .  atorvastatin (LIPITOR) 40 MG tablet, Take 40 mg by mouth daily., Disp: , Rfl:  .  clopidogrel (PLAVIX) 75 MG tablet, Take 75 mg by mouth daily., Disp: , Rfl:  .  ELIQUIS 5 MG TABS tablet, Take 5 mg by mouth 2 (two) times daily., Disp: , Rfl:  .  famotidine (PEPCID) 20 MG tablet, Take 20 mg by mouth 2 (two) times daily., Disp: , Rfl:  .  HUMALOG KWIKPEN 100 UNIT/ML KwikPen, Inject 0-14 Units into the skin 3 (three) times daily with meals., Disp: , Rfl:  .  Insulin Glargine (BASAGLAR KWIKPEN) 100 UNIT/ML SOPN, Inject 28 Units into the skin at bedtime., Disp: , Rfl:  .  isosorbide mononitrate (IMDUR) 30 MG 24 hr tablet, Take 30 mg by mouth daily., Disp: , Rfl:  .  losartan (COZAAR) 25 MG tablet, Take 25 mg by mouth daily., Disp: , Rfl:  .  Melatonin 5 MG TABS, Take 5 mg by mouth at bedtime., Disp: , Rfl:  .  metoprolol succinate (TOPROL-XL) 50 MG 24 hr tablet, Take 50 mg by mouth daily., Disp: , Rfl:  .  nitroGLYCERIN (NITROSTAT) 0.4 MG SL tablet, Place 0.4 mg under the tongue every 5 (five) minutes x 3 doses as needed for chest pain., Disp: , Rfl:  .  NOVOLOG FLEXPEN 100 UNIT/ML FlexPen, Inject 0-14 Units into the skin 3 (three) times daily with meals., Disp: , Rfl:  .  oxyCODONE (OXY IR/ROXICODONE) 5 MG immediate release tablet, Take 5 mg by mouth 3 (three)  times daily as needed for pain., Disp: , Rfl:  .  sertraline (ZOLOFT) 50 MG tablet, Take 50 mg by mouth daily., Disp: , Rfl:   Past Medical History: Past Medical History:  Diagnosis Date  . Coronary artery disease   . Diabetes mellitus without complication ([place])     Tobacco Use: Social History   Tobacco Use  Smoking Status Former Smoker  Smokeless Tobacco Never Used    Labs: Recent Review Flowsheet Data    There is no flowsheet data to display.       Exercise Target Goals: Exercise Program Goal: Individual exercise prescription set using results from initial 6 min walk test and THRR while considering  patient's activity barriers and safety.   Exercise Prescription Goal: Initial exercise prescription builds to 30-45 minutes a day of aerobic activity, 2-3 days per week.  Home exercise guidelines will be given to patient during program as part of exercise prescription that the participant will acknowledge.  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 HR  111 bpm     Max Ex. BP  136/74      2 Minute Post BP  122/60        Oxygen Initial Assessment:   Oxygen Re-Evaluation:   Oxygen Discharge (Final Oxygen Re-Evaluation):   Initial Exercise Prescription: Initial Exercise Prescription - 11/03/18 1300      Date of Initial Exercise RX and Referring Provider   Date  11/03/18    Referring Provider  Celine Ahr MD      Treadmill   MPH  1.8    Grade  0.5    Minutes  15    METs  2.52      Recumbant Bike   Level  2    RPM  50    Watts  33    Minutes  15    METs  2      REL-XR   Level  1    Speed  50    Minutes  15    METs  2      T5 Nustep   Level  2    SPM  80    Minutes  15    METs  2      Prescription Details   Frequency (times per week)  3    Duration  Progress to 30 minutes of continuous aerobic without signs/symptoms of physical distress      Intensity   THRR 40-80% of Max Heartrate  113-145    Ratings of Perceived Exertion  11-13    Perceived Dyspnea  0-4      Progression   Progression  Continue to progress workloads to maintain intensity without signs/symptoms of physical distress.      Resistance Training   Training Prescription  Yes    Weight  3 lbs    Reps  10-15       Perform Capillary Blood Glucose checks as needed.  Exercise Prescription Changes: Exercise Prescription Changes    Row Name 11/03/18 1300             Response to Exercise   Blood Pressure (Admit)  126/54       Blood Pressure (Exercise)  136/74       Blood Pressure (Exit)  122/60       Heart Rate (Admit)  81 bpm       Heart Rate (Exercise)  111 bpm       Heart Rate (Exit)  80 bpm       Oxygen Saturation (Admit)  99 %       Oxygen Saturation (Exercise)  98 %       Rating of Perceived Exertion (Exercise)  11       Perceived Dyspnea (Exercise)  1       Symptoms  SOB, leg myalasias       Comments  walk test results          Exercise Comments:   Exercise Goals and Review: Exercise Goals    Row Name 11/03/18 1320             Exercise  Goals   Increase Physical Activity  Yes       Intervention  Provide advice, education, support and counseling about physical activity/exercise needs.;Develop an individualized exercise prescription for aerobic and resistive training based on initial evaluation findings, risk stratification, comorbidities and participant's personal goals.       Expected Outcomes  Short Term: Attend rehab on a regular basis to increase amount of physical activity.;Long Term: Add in home exercise to make exercise part of routine and to increase amount of physical activity.;Long Term: Exercising regularly at least 3-5 days a week.       Increase Strength and Stamina  Yes       Intervention  Provide advice, education, support and counseling about physical activity/exercise needs.;Develop an individualized exercise prescription for aerobic and resistive training based on initial evaluation findings, risk stratification, comorbidities and participant's personal goals.       Expected Outcomes  Short Term: Increase workloads from initial exercise prescription for resistance, speed, and METs.;Short Term: Perform resistance training exercises routinely during rehab and add in resistance training at home;Long Term: Improve cardiorespiratory fitness, muscular endurance and strength as measured by increased METs and functional capacity (6MWT)       Able to understand and use rate of perceived exertion (RPE) scale  Yes       Intervention  Provide education and explanation on how to use RPE scale       Expected Outcomes  Short Term: Able to use RPE daily in rehab to express subjective intensity level;Long Term:  Able to use RPE to guide intensity level when exercising independently       Able to understand and use Dyspnea scale  Yes       Intervention  Provide education and explanation on how to use Dyspnea scale       Expected Outcomes  Short Term: Able to use Dyspnea scale daily in rehab to express subjective sense of shortness of  breath during exertion;Long Term: Able to use Dyspnea scale to guide intensity level when exercising independently       Knowledge and understanding of Target Heart Rate Range (THRR)  Yes       Intervention  Provide education and explanation of THRR including how the numbers were predicted and where they are located for reference       Expected Outcomes  Short Term: Able to state/look up THRR;Long Term: Able to use THRR to govern intensity when exercising independently;Short Term: Able to use daily as guideline for intensity in rehab       Able to check pulse independently  Yes       Intervention  Provide education and demonstration on how to check pulse in carotid and radial arteries.;Review the importance of being able to check your  own pulse for safety during independent exercise       Expected Outcomes  Short Term: Able to explain why pulse checking is important during independent exercise;Long Term: Able to check pulse independently and accurately       Understanding of Exercise Prescription  Yes       Intervention  Provide education, explanation, and written materials on patient's individual exercise prescription       Expected Outcomes  Short Term: Able to explain program exercise prescription;Long Term: Able to explain home exercise prescription to exercise independently          Exercise Goals Re-Evaluation :   Discharge Exercise Prescription (Final Exercise Prescription Changes): Exercise Prescription Changes - 11/03/18 1300      Response to Exercise   Blood Pressure (Admit)  126/54    Blood Pressure (Exercise)  136/74    Blood Pressure (Exit)  122/60    Heart Rate (Admit)  81 bpm    Heart Rate (Exercise)  111 bpm    Heart Rate (Exit)  80 bpm    Oxygen Saturation (Admit)  99 %    Oxygen Saturation (Exercise)  98 %    Rating of Perceived Exertion (Exercise)  11    Perceived Dyspnea (Exercise)  1    Symptoms  SOB, leg myalasias    Comments  walk test results        Nutrition:  Target Goals: Understanding of nutrition guidelines, daily intake of sodium '1500mg'$ , cholesterol '200mg'$ , calories 30% from fat and 7% or less from saturated fats, daily to have 5 or more servings of fruits and vegetables.  Biometrics: Pre Biometrics - 11/03/18 1321      Pre Biometrics   Height  5' 9.4" (1.763 m)    Weight  178 lb 9.6 oz (81 kg)    BMI (Calculated)  26.06    Single Leg Stand  1.09 seconds        Nutrition Therapy Plan and Nutrition Goals: Nutrition Therapy & Goals - 11/03/18 1211      Nutrition Therapy   Diet  Low Na, DM, HH diet    Protein (specify units)  70g    Fiber  30 grams    Whole Grain Foods  3 servings    Saturated Fats  12 max. grams    Fruits and Vegetables  5 servings/day    Sodium  1.5 grams      Personal Nutrition Goals   Nutrition Goal  ST: digest information LT: breathe better, gain weight back (200lbs goal, now about 173lbs)    Comments  Pt reports drs telling him to use a sliding slace for his insulin (bases insulin does on BG and not g of CHO eaten). Pt reports B: corn flakes with whole milk or whole wheat toast with eggs and bacon. L: processed cheese, whole wheat bread, ham. D:meat and vegetables (sometimes pasta) -sig other (girlfriend or wife) will make this meal. will sometimes have fried foods about 1x/month pt will drink diet soda and gatorade (half zero, half regular) with no water. sig other reports he is a very picky eater. Discussed HH eating (more detail than usual as pt reports no knowledge of [place] eating prior). Discussed CHO counting, pt reports they tried to have him take a class, but he was unable to. Pt reports losing weight after event and lost 30 lbs in 2 months. 13.5% in 2 months is significant.      Intervention Plan   Intervention  Prescribe, educate  and counsel regarding individualized specific dietary modifications aiming towards targeted core components such as weight, hypertension, lipid management,  diabetes, heart failure and other comorbidities.;Nutrition handout(s) given to patient.    Expected Outcomes  Short Term Goal: Understand basic principles of dietary content, such as calories, fat, sodium, cholesterol and nutrients.;Short Term Goal: A plan has been developed with personal nutrition goals set during dietitian appointment.;Long Term Goal: Adherence to prescribed nutrition plan.       Nutrition Assessments: Nutrition Assessments - 11/03/18 0723      MEDFICTS Scores   Pre Score  19       Nutrition Goals Re-Evaluation:   Nutrition Goals Discharge (Final Nutrition Goals Re-Evaluation):   Psychosocial: Target Goals: Acknowledge presence or absence of significant depression and/or stress, maximize coping skills, provide positive support system. Participant is able to verbalize types and ability to use techniques and skills needed for reducing stress and depression.   Initial Review & Psychosocial Screening: Initial Psych Review & Screening - 10/30/18 1107      Initial Review   Current issues with  Current Stress Concerns    Source of Stress Concerns  Chronic Illness;Unable to perform yard/household activities;Unable to participate in former interests or hobbies    Comments  Balian is doing well post CABG. He is still adjusting to learning his limitations after his stroke. He has issues forming his thoughts into words and some times he feels like his legs are "on backwards." His girlfriend is very supportive and has taken over managing his healthcare.      Family Dynamics   Good Support System?  Yes      Barriers   Psychosocial barriers to participate in program  There are no identifiable barriers or psychosocial needs.      Screening Interventions   Interventions  Encouraged to exercise    Expected Outcomes  Short Term goal: Utilizing psychosocial counselor, staff and physician to assist with identification of specific Stressors or current issues interfering with  healing process. Setting desired goal for each stressor or current issue identified.;Long Term Goal: Stressors or current issues are controlled or eliminated.;Short Term goal: Identification and review with participant of any Quality of Life or Depression concerns found by scoring the questionnaire.;Long Term goal: The participant improves quality of Life and PHQ9 Scores as seen by post scores and/or verbalization of changes       Quality of Life Scores:  Quality of Life - 11/03/18 0723      Quality of Life   Select  Quality of Life      Quality of Life Scores   Health/Function Pre  9.6 %    Socioeconomic Pre  14.93 %    Psych/Spiritual Pre  14.35 %    Family Pre  26.4 %    GLOBAL Pre  14.17 %      Scores of 19 and below usually indicate a poorer quality of life in these areas.  A difference of  2-3 points is a clinically meaningful difference.  A difference of 2-3 points in the total score of the Quality of Life Index has been associated with significant improvement in overall quality of life, self-image, physical symptoms, and general health in studies assessing change in quality of life.  PHQ-9: Recent Review Flowsheet Data    Depression screen [facility] 2/9 11/03/2018   Decreased Interest 2   Down, Depressed, Hopeless 1   PHQ - 2 Score 3   Altered sleeping 3   Tired, decreased energy 3  Change in appetite 3   Feeling bad or failure about yourself  1   Trouble concentrating 3   Moving slowly or fidgety/restless 2   Suicidal thoughts 0   PHQ-9 Score 18   Difficult doing work/chores Extremely dIfficult     Interpretation of Total Score  Total Score Depression Severity:  1-4 = Minimal depression, 5-9 = Mild depression, 10-14 = Moderate depression, 15-19 = Moderately severe depression, 20-27 = Severe depression   Psychosocial Evaluation and Intervention:   Psychosocial Re-Evaluation:   Psychosocial Discharge (Final Psychosocial Re-Evaluation):   Vocational  Rehabilitation: Provide vocational rehab assistance to qualifying candidates.   Vocational Rehab Evaluation & Intervention: Vocational Rehab - 10/30/18 1107      Initial Vocational Rehab Evaluation & Intervention   Assessment shows need for Vocational Rehabilitation  No       Education: Education Goals: Education classes will be provided on a variety of topics geared toward better understanding of heart health and risk factor modification. Participant will state understanding/return demonstration of topics presented as noted by education test scores.  Learning Barriers/Preferences: Learning Barriers/Preferences - 10/30/18 1106      Learning Barriers/Preferences   Learning Barriers  None    Learning Preferences  Individual Instruction       Education Topics:  AED/CPR: - Group verbal and written instruction with the use of models to demonstrate the basic use of the AED with the basic ABC's of resuscitation.   General Nutrition Guidelines/Fats and Fiber: -Group instruction provided by verbal, written material, models and posters to present the general guidelines for heart healthy nutrition. Gives an explanation and review of dietary fats and fiber.   Controlling Sodium/Reading Food Labels: -Group verbal and written material supporting the discussion of sodium use in heart healthy nutrition. Review and explanation with models, verbal and written materials for utilization of the food label.   Exercise Physiology & General Exercise Guidelines: - Group verbal and written instruction with models to review the exercise physiology of the cardiovascular system and associated critical values. Provides general exercise guidelines with specific guidelines to those with heart or lung disease.    Aerobic Exercise & Resistance Training: - Gives group verbal and written instruction on the various components of exercise. Focuses on aerobic and resistive training programs and the benefits of  this training and how to safely progress through these programs..   Flexibility, Balance, Mind/Body Relaxation: Provides group verbal/written instruction on the benefits of flexibility and balance training, including mind/body exercise modes such as yoga, pilates and tai chi.  Demonstration and skill practice provided.   Stress and Anxiety: - Provides group verbal and written instruction about the health risks of elevated stress and causes of high stress.  Discuss the correlation between heart/lung disease and anxiety and treatment options. Review healthy ways to manage with stress and anxiety.   Depression: - Provides group verbal and written instruction on the correlation between heart/lung disease and depressed mood, treatment options, and the stigmas associated with seeking treatment.   Anatomy & Physiology of the Heart: - Group verbal and written instruction and models provide basic cardiac anatomy and physiology, with the coronary electrical and arterial systems. Review of Valvular disease and Heart Failure   Cardiac Procedures: - Group verbal and written instruction to review commonly prescribed medications for heart disease. Reviews the medication, class of the drug, and side effects. Includes the steps to properly store meds and maintain the prescription regimen. (beta blockers and nitrates)   Cardiac Medications I: -  Group verbal and written instruction to review commonly prescribed medications for heart disease. Reviews the medication, class of the drug, and side effects. Includes the steps to properly store meds and maintain the prescription regimen.   Cardiac Medications II: -Group verbal and written instruction to review commonly prescribed medications for heart disease. Reviews the medication, class of the drug, and side effects. (all other drug classes)    Go Sex-Intimacy & Heart Disease, Get SMART - Goal Setting: - Group verbal and written instruction through game  format to discuss heart disease and the return to sexual intimacy. Provides group verbal and written material to discuss and apply goal setting through the application of the S.M.A.R.T. Method.   Other Matters of the Heart: - Provides group verbal, written materials and models to describe Stable Angina and Peripheral Artery. Includes description of the disease process and treatment options available to the cardiac patient.   Exercise & Equipment Safety: - Individual verbal instruction and demonstration of equipment use and safety with use of the equipment.   Cardiac Rehab from 11/03/2018 in [facility] Cardiac and Pulmonary Rehab  Date  11/03/18  Educator  [facility]  Instruction Review Code  1- Verbalizes Understanding      Infection Prevention: - Provides verbal and written material to individual with discussion of infection control including proper hand washing and proper equipment cleaning during exercise session.   Cardiac Rehab from 11/03/2018 in [facility] Cardiac and Pulmonary Rehab  Date  11/03/18  Educator  [facility]  Instruction Review Code  1- Verbalizes Understanding      Falls Prevention: - Provides verbal and written material to individual with discussion of falls prevention and safety.   Cardiac Rehab from 11/03/2018 in [facility] Cardiac and Pulmonary Rehab  Date  11/03/18  Educator  [facility]  Instruction Review Code  1- Verbalizes Understanding      Diabetes: - Individual verbal and written instruction to review signs/symptoms of diabetes, desired ranges of glucose level fasting, after meals and with exercise. Acknowledge that pre and post exercise glucose checks will be done for 3 sessions at entry of program.   Cardiac Rehab from 11/03/2018 in [facility] Cardiac and Pulmonary Rehab  Date  10/30/18  Educator  [facility]  Instruction Review Code  1- Verbalizes Understanding      Know Your Numbers and Risk Factors: -Group verbal and written instruction about important numbers in your health.  Discussion of what  are risk factors and how they play a role in the disease process.  Review of Cholesterol, Blood Pressure, Diabetes, and BMI and the role they play in your overall health.   Sleep Hygiene: -Provides group verbal and written instruction about how sleep can affect your health.  Define sleep hygiene, discuss sleep cycles and impact of sleep habits. Review good sleep hygiene tips.    Other: -Provides group and verbal instruction on various topics (see comments)   Knowledge Questionnaire Score: Knowledge Questionnaire Score - 11/03/18 0723      Knowledge Questionnaire Score   Pre Score  24/26 missed Heart Attack, Angina       Core Components/Risk Factors/Patient Goals at Admission: Personal Goals and Risk Factors at Admission - 11/03/18 1321      Core Components/Risk Factors/Patient Goals on Admission    Weight Management  Yes;Weight Gain    Intervention  Weight Management: Develop a combined nutrition and exercise program designed to reach desired caloric intake, while maintaining appropriate intake of nutrient and fiber, sodium and fats, and appropriate energy expenditure required  for the weight goal.;Weight Management: Provide education and appropriate resources to help participant work on and attain dietary goals.    Admit Weight  178 lb 9.6 oz (81 kg)    Goal Weight: Short Term  185 lb (83.9 kg)    Expected Outcomes  Short Term: Continue to assess and modify interventions until short term weight is achieved;Weight Gain: Understanding of general recommendations for a high calorie, high protein meal plan that promotes weight gain by distributing calorie intake throughout the day with the consumption for 4-5 meals, snacks, and/or supplements;Understanding of distribution of calorie intake throughout the day with the consumption of 4-5 meals/snacks;Long Term: Adherence to nutrition and physical activity/exercise program aimed toward attainment of established weight goal    Diabetes  Yes     Intervention  Provide education about signs/symptoms and action to take for hypo/hyperglycemia.;Provide education about proper nutrition, including hydration, and aerobic/resistive exercise prescription along with prescribed medications to achieve blood glucose in normal ranges: Fasting glucose 65-99 mg/dL    Expected Outcomes  Short Term: Participant verbalizes understanding of the signs/symptoms and immediate care of hyper/hypoglycemia, proper foot care and importance of medication, aerobic/resistive exercise and nutrition plan for blood glucose control.;Long Term: Attainment of HbA1C < 7%.    Hypertension  Yes    Intervention  Provide education on lifestyle modifcations including regular physical activity/exercise, weight management, moderate sodium restriction and increased consumption of fresh fruit, vegetables, and low fat dairy, alcohol moderation, and smoking cessation.;Monitor prescription use compliance.    Expected Outcomes  Short Term: Continued assessment and intervention until BP is < 140/69m HG in hypertensive participants. < 130/89mHG in hypertensive participants with diabetes, heart failure or chronic kidney disease.;Long Term: Maintenance of blood pressure at goal levels.    Lipids  Yes    Intervention  Provide education and support for participant on nutrition & aerobic/resistive exercise along with prescribed medications to achieve LDL <7073mHDL >76m39m  Expected Outcomes  Short Term: Participant states understanding of desired cholesterol values and is compliant with medications prescribed. Participant is following exercise prescription and nutrition guidelines.;Long Term: Cholesterol controlled with medications as prescribed, with individualized exercise RX and with personalized nutrition plan. Value goals: LDL < 70mg47mL > 40 mg.       Core Components/Risk Factors/Patient Goals Review:    Core Components/Risk Factors/Patient Goals at Discharge (Final Review):    ITP  Comments: ITP Comments    Row Name 10/30/18 1038 11/03/18 1313         ITP Comments  Virtual Orientation completed. Diagnosis can be found in CE 5/28. EP/RD orientation scheduled for 7/28 at 11.  Completed 6MWT, gym orientation, and RD evaluation. Initial ITP created and sent for review to Dr. Mark Emily Filbertical Director.         Comments: Initial ITP

## 2018-11-03 NOTE — Patient Instructions (Signed)
Patient Instructions  Patient Details  Name: Bruce Lucas MRN: [id number] Date of Birth: [birth year] Referring Provider:  Nolene Ebbs, MD  Below are your personal goals for exercise, nutrition, and risk factors. Our goal is to help you stay on track towards obtaining and maintaining these goals. We will be discussing your progress on these goals with you throughout the program.  Initial Exercise Prescription: Initial Exercise Prescription - 11/03/18 1300      Date of Initial Exercise RX and Referring Provider   Date  11/03/18    Referring Provider  Celine Ahr MD      Treadmill   MPH  1.8    Grade  0.5    Minutes  15    METs  2.52      Recumbant Bike   Level  2    RPM  50    Watts  33    Minutes  15    METs  2      REL-XR   Level  1    Speed  50    Minutes  15    METs  2      T5 Nustep   Level  2    SPM  80    Minutes  15    METs  2      Prescription Details   Frequency (times per week)  3    Duration  Progress to 30 minutes of continuous aerobic without signs/symptoms of physical distress      Intensity   THRR 40-80% of Max Heartrate  113-145    Ratings of Perceived Exertion  11-13    Perceived Dyspnea  0-4      Progression   Progression  Continue to progress workloads to maintain intensity without signs/symptoms of physical distress.      Resistance Training   Training Prescription  Yes    Weight  3 lbs    Reps  10-15       Exercise Goals: Frequency: Be able to perform aerobic exercise two to three times per week in program working toward 2-5 days per week of home exercise.  Intensity: Work with a perceived exertion of 11 (fairly light) - 15 (hard) while following your exercise prescription.  We will make changes to your prescription with you as you progress through the program.   Duration: Be able to do 30 to 45 minutes of continuous aerobic exercise in addition to a 5 minute warm-up and a 5 minute cool-down routine.   Nutrition  Goals: Your personal nutrition goals will be established when you do your nutrition analysis with the dietician.  The following are general nutrition guidelines to follow: Cholesterol < 200mg /day Sodium < 1500mg /day Fiber: Men over 50 yrs - 30 grams per day  Personal Goals: Personal Goals and Risk Factors at Admission - 11/03/18 1321      Core Components/Risk Factors/Patient Goals on Admission    Weight Management  Yes;Weight Gain    Intervention  Weight Management: Develop a combined nutrition and exercise program designed to reach desired caloric intake, while maintaining appropriate intake of nutrient and fiber, sodium and fats, and appropriate energy expenditure required for the weight goal.;Weight Management: Provide education and appropriate resources to help participant work on and attain dietary goals.    Admit Weight  178 lb 9.6 oz (81 kg)    Goal Weight: Short Term  185 lb (83.9 kg)    Expected Outcomes  Short Term: Continue to assess and  modify interventions until short term weight is achieved;Weight Gain: Understanding of general recommendations for a high calorie, high protein meal plan that promotes weight gain by distributing calorie intake throughout the day with the consumption for 4-5 meals, snacks, and/or supplements;Understanding of distribution of calorie intake throughout the day with the consumption of 4-5 meals/snacks;Long Term: Adherence to nutrition and physical activity/exercise program aimed toward attainment of established weight goal    Diabetes  Yes    Intervention  Provide education about signs/symptoms and action to take for hypo/hyperglycemia.;Provide education about proper nutrition, including hydration, and aerobic/resistive exercise prescription along with prescribed medications to achieve blood glucose in normal ranges: Fasting glucose 65-99 mg/dL    Expected Outcomes  Short Term: Participant verbalizes understanding of the signs/symptoms and immediate care of  hyper/hypoglycemia, proper foot care and importance of medication, aerobic/resistive exercise and nutrition plan for blood glucose control.;Long Term: Attainment of HbA1C < 7%.    Hypertension  Yes    Intervention  Provide education on lifestyle modifcations including regular physical activity/exercise, weight management, moderate sodium restriction and increased consumption of fresh fruit, vegetables, and low fat dairy, alcohol moderation, and smoking cessation.;Monitor prescription use compliance.    Expected Outcomes  Short Term: Continued assessment and intervention until BP is < 140/58mm HG in hypertensive participants. < 130/94mm HG in hypertensive participants with diabetes, heart failure or chronic kidney disease.;Long Term: Maintenance of blood pressure at goal levels.    Lipids  Yes    Intervention  Provide education and support for participant on nutrition & aerobic/resistive exercise along with prescribed medications to achieve LDL 70mg , HDL >40mg .    Expected Outcomes  Short Term: Participant states understanding of desired cholesterol values and is compliant with medications prescribed. Participant is following exercise prescription and nutrition guidelines.;Long Term: Cholesterol controlled with medications as prescribed, with individualized exercise RX and with personalized nutrition plan. Value goals: LDL < 70mg , HDL > 40 mg.       Tobacco Use Initial Evaluation: Social History   Tobacco Use  Smoking Status Former Smoker  Smokeless Tobacco Never Used    Exercise Goals and Review: Exercise Goals    Row Name 11/03/18 1320             Exercise Goals   Increase Physical Activity  Yes       Intervention  Provide advice, education, support and counseling about physical activity/exercise needs.;Develop an individualized exercise prescription for aerobic and resistive training based on initial evaluation findings, risk stratification, comorbidities and participant's personal  goals.       Expected Outcomes  Short Term: Attend rehab on a regular basis to increase amount of physical activity.;Long Term: Add in home exercise to make exercise part of routine and to increase amount of physical activity.;Long Term: Exercising regularly at least 3-5 days a week.       Increase Strength and Stamina  Yes       Intervention  Provide advice, education, support and counseling about physical activity/exercise needs.;Develop an individualized exercise prescription for aerobic and resistive training based on initial evaluation findings, risk stratification, comorbidities and participant's personal goals.       Expected Outcomes  Short Term: Increase workloads from initial exercise prescription for resistance, speed, and METs.;Short Term: Perform resistance training exercises routinely during rehab and add in resistance training at home;Long Term: Improve cardiorespiratory fitness, muscular endurance and strength as measured by increased METs and functional capacity (6MWT)       Able to understand and use  rate of perceived exertion (RPE) scale  Yes       Intervention  Provide education and explanation on how to use RPE scale       Expected Outcomes  Short Term: Able to use RPE daily in rehab to express subjective intensity level;Long Term:  Able to use RPE to guide intensity level when exercising independently       Able to understand and use Dyspnea scale  Yes       Intervention  Provide education and explanation on how to use Dyspnea scale       Expected Outcomes  Short Term: Able to use Dyspnea scale daily in rehab to express subjective sense of shortness of breath during exertion;Long Term: Able to use Dyspnea scale to guide intensity level when exercising independently       Knowledge and understanding of Target Heart Rate Range (THRR)  Yes       Intervention  Provide education and explanation of THRR including how the numbers were predicted and where they are located for reference        Expected Outcomes  Short Term: Able to state/look up THRR;Long Term: Able to use THRR to govern intensity when exercising independently;Short Term: Able to use daily as guideline for intensity in rehab       Able to check pulse independently  Yes       Intervention  Provide education and demonstration on how to check pulse in carotid and radial arteries.;Review the importance of being able to check your own pulse for safety during independent exercise       Expected Outcomes  Short Term: Able to explain why pulse checking is important during independent exercise;Long Term: Able to check pulse independently and accurately       Understanding of Exercise Prescription  Yes       Intervention  Provide education, explanation, and written materials on patient's individual exercise prescription       Expected Outcomes  Short Term: Able to explain program exercise prescription;Long Term: Able to explain home exercise prescription to exercise independently          Copy of goals given to participant.

## 2018-11-05 ENCOUNTER — Other Ambulatory Visit: Payer: Self-pay

## 2018-11-05 ENCOUNTER — Encounter: Payer: 59 | Admitting: *Deleted

## 2018-11-05 DIAGNOSIS — I214 Non-ST elevation (NSTEMI) myocardial infarction: Secondary | ICD-10-CM | POA: Diagnosis not present

## 2018-11-05 DIAGNOSIS — Z951 Presence of aortocoronary bypass graft: Secondary | ICD-10-CM

## 2018-11-05 LAB — GLUCOSE, CAPILLARY
Glucose-Capillary: 206 mg/dL — ABNORMAL HIGH (ref 70–99)
Glucose-Capillary: 150 mg/dL — ABNORMAL HIGH (ref 70–99)

## 2018-11-05 NOTE — Progress Notes (Signed)
Daily Session Note  Patient Details  Name: Bruce Lucas MRN: [id number] Date of Birth: June 18, 1960 Referring Provider:     Cardiac Rehab from 11/03/2018 in [facility] Cardiac and Pulmonary Rehab  Referring Provider  Celine Ahr MD      Encounter Date: 11/05/2018  Check In: Session Check In - 11/05/18 1113      Check-In   Supervising physician immediately available to respond to emergencies  See telemetry face sheet for immediately available ER MD    Location  [facility]-Cardiac & Pulmonary Rehab    Staff Present  Renita Papa, RN BSN;Jeanna Durrell BS, Exercise Physiologist;Joseph Tessie Fass RCP,RRT,BSRT    Virtual Visit  No    Medication changes reported      No    Warm-up and Cool-down  Performed on first and last piece of equipment    Resistance Training Performed  Yes    VAD Patient?  No    PAD/SET Patient?  No      Pain Assessment   Currently in Pain?  No/denies          Social History   Tobacco Use  Smoking Status Former Smoker  Smokeless Tobacco Never Used    Goals Met:  Independence with exercise equipment Exercise tolerated well No report of cardiac concerns or symptoms Strength training completed today  Goals Unmet:  Not Applicable  Comments: First full day of exercise!  Patient was oriented to gym and equipment including functions, settings, policies, and procedures.  Patient's individual exercise prescription and treatment plan were reviewed.  All starting workloads were established based on the results of the 6 minute walk test done at initial orientation visit.  The plan for exercise progression was also introduced and progression will be customized based on patient's performance and goals.    Dr. Emily Filbert is Medical Director for [place] and [facility].

## 2018-11-06 ENCOUNTER — Encounter: Payer: 59 | Admitting: *Deleted

## 2018-11-06 DIAGNOSIS — I214 Non-ST elevation (NSTEMI) myocardial infarction: Secondary | ICD-10-CM | POA: Diagnosis not present

## 2018-11-06 DIAGNOSIS — Z951 Presence of aortocoronary bypass graft: Secondary | ICD-10-CM

## 2018-11-06 LAB — GLUCOSE, CAPILLARY
Glucose-Capillary: 159 mg/dL — ABNORMAL HIGH (ref 70–99)
Glucose-Capillary: 92 mg/dL (ref 70–99)

## 2018-11-06 NOTE — Progress Notes (Signed)
Daily Session Note  Patient Details  Name: Bruce Lucas MRN: [id number] Date of Birth: 1960-06-17 Referring Provider:     Cardiac Rehab from 11/03/2018 in [facility] Cardiac and Pulmonary Rehab  Referring Provider  Celine Ahr MD      Encounter Date: 11/06/2018  Check In: Session Check In - 11/06/18 1101      Check-In   Supervising physician immediately available to respond to emergencies  See telemetry face sheet for immediately available ER MD    Location  [facility]-Cardiac & Pulmonary Rehab    Staff Present  Renita Papa, RN BSN;Jessica Luan Pulling, MA, RCEP, CCRP, CCET;Joseph Century RCP,RRT,BSRT    Virtual Visit  No    Medication changes reported      No    Fall or balance concerns reported     No    Warm-up and Cool-down  Performed on first and last piece of equipment    Resistance Training Performed  Yes    VAD Patient?  No    PAD/SET Patient?  No      Pain Assessment   Currently in Pain?  No/denies          Social History   Tobacco Use  Smoking Status Former Smoker  Smokeless Tobacco Never Used    Goals Met:  Independence with exercise equipment Exercise tolerated well No report of cardiac concerns or symptoms Strength training completed today  Goals Unmet:  Not Applicable  Comments: Pt able to follow exercise prescription today without complaint.  Will continue to monitor for progression.    Dr. Emily Filbert is Medical Director for [place] and [facility].

## 2018-11-10 ENCOUNTER — Other Ambulatory Visit: Payer: Self-pay

## 2018-11-10 ENCOUNTER — Encounter: Payer: 59 | Attending: Cardiology

## 2018-11-10 DIAGNOSIS — I25810 Atherosclerosis of coronary artery bypass graft(s) without angina pectoris: Secondary | ICD-10-CM | POA: Diagnosis not present

## 2018-11-10 DIAGNOSIS — I214 Non-ST elevation (NSTEMI) myocardial infarction: Secondary | ICD-10-CM | POA: Insufficient documentation

## 2018-11-10 DIAGNOSIS — Z951 Presence of aortocoronary bypass graft: Secondary | ICD-10-CM | POA: Insufficient documentation

## 2018-11-10 LAB — GLUCOSE, CAPILLARY
Glucose-Capillary: 113 mg/dL — ABNORMAL HIGH (ref 70–99)
Glucose-Capillary: 80 mg/dL (ref 70–99)

## 2018-11-10 NOTE — Progress Notes (Signed)
Daily Session Note  Patient Details  Name: Bruce Lucas MRN: [id number] Date of Birth: Oct 19, 1960 Referring Provider:     Cardiac Rehab from 11/03/2018 in [facility] Cardiac and Pulmonary Rehab  Referring Provider  Celine Ahr MD      Encounter Date: 11/10/2018  Check In: Session Check In - 11/10/18 1052      Check-In   Supervising physician immediately available to respond to emergencies  See telemetry face sheet for immediately available ER MD    Location  [facility]-Cardiac & Pulmonary Rehab    Staff Present  Justin Mend RCP,RRT,BSRT;Heath Lark, RN, BSN, CCRP;Jeanna Durrell BS, Exercise Physiologist;Amanda Oletta Darter, BA, ACSM CEP, Exercise Physiologist    Virtual Visit  No    Medication changes reported      No    Fall or balance concerns reported     No    Warm-up and Cool-down  Performed on first and last piece of equipment    Resistance Training Performed  Yes    VAD Patient?  No    PAD/SET Patient?  No      Pain Assessment   Currently in Pain?  No/denies          Social History   Tobacco Use  Smoking Status Former Smoker  Smokeless Tobacco Never Used    Goals Met:  Exercise tolerated well No report of cardiac concerns or symptoms Strength training completed today  Goals Unmet:  Not Applicable  Comments: Pt able to follow exercise prescription today without complaint.  Will continue to monitor for progression.    Dr. Emily Filbert is Medical Director for [place] and [facility].

## 2018-11-12 ENCOUNTER — Other Ambulatory Visit: Payer: Self-pay

## 2018-11-12 DIAGNOSIS — I214 Non-ST elevation (NSTEMI) myocardial infarction: Secondary | ICD-10-CM | POA: Diagnosis not present

## 2018-11-12 DIAGNOSIS — Z951 Presence of aortocoronary bypass graft: Secondary | ICD-10-CM

## 2018-11-12 NOTE — Progress Notes (Signed)
Daily Session Note  Patient Details  Name: Bruce Lucas MRN: [id number] Date of Birth: 1960-09-26 Referring Provider:     Cardiac Rehab from 11/03/2018 in [facility] Cardiac and Pulmonary Rehab  Referring Provider  Celine Ahr MD      Encounter Date: 11/12/2018  Check In:      Social History   Tobacco Use  Smoking Status Former Smoker  Smokeless Tobacco Never Used    Goals Met:  Independence with exercise equipment No cardiac symptoms Strength training completed  Goals Unmet:  Not Applicable  Comments: Pt able to follow exercise prescription today without complaint.  Will continue to monitor for progression.    Dr. Emily Filbert is Medical Director for [place] and [facility].

## 2018-11-13 ENCOUNTER — Encounter: Payer: 59 | Admitting: *Deleted

## 2018-11-13 DIAGNOSIS — I214 Non-ST elevation (NSTEMI) myocardial infarction: Secondary | ICD-10-CM | POA: Diagnosis not present

## 2018-11-13 DIAGNOSIS — Z951 Presence of aortocoronary bypass graft: Secondary | ICD-10-CM

## 2018-11-13 NOTE — Progress Notes (Signed)
Daily Session Note  Patient Details  Name: Bruce Lucas MRN: [id number] Date of Birth: [birth year] Referring Provider:     Cardiac Rehab from 11/03/2018 in [facility] Cardiac and Pulmonary Rehab  Referring Provider  Celine Ahr MD      Encounter Date: 11/13/2018  Check In: Session Check In - 11/13/18 1056      Check-In   Supervising physician immediately available to respond to emergencies  See telemetry face sheet for immediately available ER MD    Location  [facility]-Cardiac & Pulmonary Rehab    Staff Present  Renita Papa, RN Vickki Hearing, BA, ACSM CEP, Exercise Physiologist;Joseph Tessie Fass RCP,RRT,BSRT    Virtual Visit  No    Medication changes reported      No    Fall or balance concerns reported     No    Warm-up and Cool-down  Performed on first and last piece of equipment    Resistance Training Performed  Yes    VAD Patient?  No    PAD/SET Patient?  No      Pain Assessment   Currently in Pain?  No/denies          Social History   Tobacco Use  Smoking Status Former Smoker  Smokeless Tobacco Never Used    Goals Met:  Independence with exercise equipment Exercise tolerated well No report of cardiac concerns or symptoms Strength training completed today  Goals Unmet:  Not Applicable  Comments: Pt able to follow exercise prescription today without complaint.  Will continue to monitor for progression.    Dr. Emily Filbert is Medical Director for [place] and [facility].

## 2018-11-17 ENCOUNTER — Other Ambulatory Visit: Payer: Self-pay

## 2018-11-17 ENCOUNTER — Encounter: Payer: 59 | Admitting: *Deleted

## 2018-11-17 DIAGNOSIS — I214 Non-ST elevation (NSTEMI) myocardial infarction: Secondary | ICD-10-CM | POA: Diagnosis not present

## 2018-11-17 DIAGNOSIS — Z951 Presence of aortocoronary bypass graft: Secondary | ICD-10-CM

## 2018-11-17 NOTE — Progress Notes (Signed)
Daily Session Note  Patient Details  Name: Bruce Lucas MRN: [id number] Date of Birth: March 01, 1961 Referring Provider:     Cardiac Rehab from 11/03/2018 in [facility] Cardiac and Pulmonary Rehab  Referring Provider  Celine Ahr MD      Encounter Date: 11/17/2018  Check In: Session Check In - 11/17/18 1055      Check-In   Supervising physician immediately available to respond to emergencies  See telemetry face sheet for immediately available ER MD    Location  [facility]-Cardiac & Pulmonary Rehab    Staff Present  Heath Lark, RN, BSN, CCRP;Amanda Sommer, BA, ACSM CEP, Exercise Physiologist;Joseph Hood RCP,RRT,BSRT    Virtual Visit  No    Medication changes reported      No    Fall or balance concerns reported     No    Warm-up and Cool-down  Performed on first and last piece of equipment    Resistance Training Performed  Yes    VAD Patient?  No    PAD/SET Patient?  No      Pain Assessment   Currently in Pain?  No/denies          Social History   Tobacco Use  Smoking Status Former Smoker  Smokeless Tobacco Never Used    Goals Met:  Independence with exercise equipment Exercise tolerated well No report of cardiac concerns or symptoms Strength training completed today  Goals Unmet:  Not Applicable  Comments: Pt able to follow exercise prescription today without complaint.  Will continue to monitor for progression.  Reviewed home exercise with pt today.  Pt plans to walking at home for exercise.  Reviewed THR, pulse, RPE, sign and symptoms, NTG use, and when to call 911 or MD.  Also discussed weather considerations and indoor options.  Pt voiced understanding.   Dr. Emily Filbert is Medical Director for [place] and [facility].

## 2018-11-18 ENCOUNTER — Encounter: Payer: Self-pay | Admitting: *Deleted

## 2018-11-18 DIAGNOSIS — Z951 Presence of aortocoronary bypass graft: Secondary | ICD-10-CM

## 2018-11-18 NOTE — Progress Notes (Signed)
Cardiac Individual Treatment Plan  Patient Details  Name: Bruce Lucas MRN: [id number] Date of Birth: 08/15/1960 Referring Provider:     Cardiac Rehab from 11/03/2018 in [facility] Cardiac and Pulmonary Rehab  Referring Provider  Celine Ahr MD      Initial Encounter Date:    Cardiac Rehab from 11/03/2018 in [facility] Cardiac and Pulmonary Rehab  Date  11/03/18      Visit Diagnosis: S/P CABG x 2  Patient's Home Medications on Admission:  Current Outpatient Medications:  .  amiodarone (PACERONE) 200 MG tablet, Take 200 mg by mouth daily., Disp: , Rfl:  .  aspirin 81 MG chewable tablet, Chew 324 mg by mouth daily., Disp: , Rfl:  .  atorvastatin (LIPITOR) 40 MG tablet, Take 40 mg by mouth daily., Disp: , Rfl:  .  clopidogrel (PLAVIX) 75 MG tablet, Take 75 mg by mouth daily., Disp: , Rfl:  .  ELIQUIS 5 MG TABS tablet, Take 5 mg by mouth 2 (two) times daily., Disp: , Rfl:  .  famotidine (PEPCID) 20 MG tablet, Take 20 mg by mouth 2 (two) times daily., Disp: , Rfl:  .  HUMALOG KWIKPEN 100 UNIT/ML KwikPen, Inject 0-14 Units into the skin 3 (three) times daily with meals., Disp: , Rfl:  .  Insulin Glargine (BASAGLAR KWIKPEN) 100 UNIT/ML SOPN, Inject 28 Units into the skin at bedtime., Disp: , Rfl:  .  isosorbide mononitrate (IMDUR) 30 MG 24 hr tablet, Take 30 mg by mouth daily., Disp: , Rfl:  .  losartan (COZAAR) 25 MG tablet, Take 25 mg by mouth daily., Disp: , Rfl:  .  Melatonin 5 MG TABS, Take 5 mg by mouth at bedtime., Disp: , Rfl:  .  metoprolol succinate (TOPROL-XL) 50 MG 24 hr tablet, Take 50 mg by mouth daily., Disp: , Rfl:  .  nitroGLYCERIN (NITROSTAT) 0.4 MG SL tablet, Place 0.4 mg under the tongue every 5 (five) minutes x 3 doses as needed for chest pain., Disp: , Rfl:  .  NOVOLOG FLEXPEN 100 UNIT/ML FlexPen, Inject 0-14 Units into the skin 3 (three) times daily with meals., Disp: , Rfl:  .  oxyCODONE (OXY IR/ROXICODONE) 5 MG immediate release tablet, Take 5 mg by mouth 3 (three)  times daily as needed for pain., Disp: , Rfl:  .  sertraline (ZOLOFT) 50 MG tablet, Take 50 mg by mouth daily., Disp: , Rfl:   Past Medical History: Past Medical History:  Diagnosis Date  . Coronary artery disease   . Diabetes mellitus without complication ([place])     Tobacco Use: Social History   Tobacco Use  Smoking Status Former Smoker  Smokeless Tobacco Never Used    Labs: Recent Review Flowsheet Data    There is no flowsheet data to display.       Exercise Target Goals: Exercise Program Goal: Individual exercise prescription set using results from initial 6 min walk test and THRR while considering  patient's activity barriers and safety.   Exercise Prescription Goal: Initial exercise prescription builds to 30-45 minutes a day of aerobic activity, 2-3 days per week.  Home exercise guidelines will be given to patient during program as part of exercise prescription that the participant will acknowledge.  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 HR  111 bpm     Max Ex. BP  136/74      2 Minute Post BP  122/60        Oxygen Initial Assessment:   Oxygen Re-Evaluation:   Oxygen Discharge (Final Oxygen Re-Evaluation):   Initial Exercise Prescription: Initial Exercise Prescription - 11/03/18 1300      Date of Initial Exercise RX and Referring Provider   Date  11/03/18    Referring Provider  Celine Ahr MD      Treadmill   MPH  1.8    Grade  0.5    Minutes  15    METs  2.52      Recumbant Bike   Level  2    RPM  50    Watts  33    Minutes  15    METs  2      REL-XR   Level  1    Speed  50    Minutes  15    METs  2      T5 Nustep   Level  2    SPM  80    Minutes  15    METs  2      Prescription Details   Frequency (times per week)  3    Duration  Progress to 30 minutes of continuous aerobic without signs/symptoms of physical distress      Intensity   THRR 40-80% of Max Heartrate  113-145    Ratings of Perceived Exertion  11-13    Perceived Dyspnea  0-4      Progression   Progression  Continue to progress workloads to maintain intensity without signs/symptoms of physical distress.      Resistance Training   Training Prescription  Yes    Weight  3 lbs    Reps  10-15       Perform Capillary Blood Glucose checks as needed.  Exercise Prescription Changes: Exercise Prescription Changes    Row Name 11/03/18 1300 11/09/18 0900 11/17/18 1100         Response to Exercise   Blood Pressure (Admit)  126/54  100/60  -     Blood Pressure (Exercise)  136/74  124/60  -     Blood Pressure (Exit)  122/60  104/60  -     Heart Rate (Admit)  81 bpm  81 bpm  -  Heart Rate (Exercise)  111 bpm  125 bpm  -     Heart Rate (Exit)  80 bpm  93 bpm  -     Oxygen Saturation (Admit)  99 %  -  -     Oxygen Saturation (Exercise)  98 %  -  -     Rating of Perceived Exertion (Exercise)  11  14  -     Perceived Dyspnea (Exercise)  1  -  -     Symptoms  SOB, leg myalasias  leg myalasis  -     Comments  walk test results  -  -     Duration  -   Continue with 30 min of aerobic exercise without signs/symptoms of physical distress.  -     Intensity  -  THRR unchanged  -       Progression   Progression  -  Continue to progress workloads to maintain intensity without signs/symptoms of physical distress.  -     Average METs  -  2.54  -       Resistance Training   Training Prescription  -  Yes  -     Weight  -  3 lbs  -     Reps  -  10-15  -       Interval Training   Interval Training  -  No  -       Recumbant Bike   Level  -  2  -     Watts  -  23  -     Minutes  -  15  -     METs  -  2.98  -       REL-XR   Level  -  1  -     Minutes  -  15  -     METs  -  2.1  -       Home Exercise Plan   Plans to continue exercise at  -  -  Home (comment) walking     Frequency  -  -  Add 2 additional days to program exercise sessions.     Initial Home Exercises Provided  -  -  11/17/18        Exercise Comments:   Exercise Goals and Review: Exercise Goals    Row Name 11/03/18 1320             Exercise Goals   Increase Physical Activity  Yes       Intervention  Provide advice, education, support and counseling about physical activity/exercise needs.;Develop an individualized exercise prescription for aerobic and resistive training based on initial evaluation findings, risk stratification, comorbidities and participant's personal goals.       Expected Outcomes  Short Term: Attend rehab on a regular basis to increase amount of physical activity.;Long Term: Add in home exercise to make exercise part of routine and to increase amount of physical activity.;Long Term: Exercising regularly at least 3-5 days a week.       Increase Strength and Stamina  Yes       Intervention  Provide advice, education, support and counseling about physical activity/exercise needs.;Develop an individualized exercise prescription for aerobic and resistive training based on initial evaluation findings, risk stratification, comorbidities and participant's  personal goals.       Expected Outcomes  Short Term: Increase workloads from initial exercise prescription for resistance, speed, and METs.;Short Term: Perform resistance training exercises routinely  during rehab and add in resistance training at home;Long Term: Improve cardiorespiratory fitness, muscular endurance and strength as measured by increased METs and functional capacity (6MWT)       Able to understand and use rate of perceived exertion (RPE) scale  Yes       Intervention  Provide education and explanation on how to use RPE scale       Expected Outcomes  Short Term: Able to use RPE daily in rehab to express subjective intensity level;Long Term:  Able to use RPE to guide intensity level when exercising independently       Able to understand and use Dyspnea scale  Yes       Intervention  Provide education and explanation on how to use Dyspnea scale       Expected Outcomes  Short Term: Able to use Dyspnea scale daily in rehab to express subjective sense of shortness of breath during exertion;Long Term: Able to use Dyspnea scale to guide intensity level when exercising independently       Knowledge and understanding of Target Heart Rate Range (THRR)  Yes       Intervention  Provide education and explanation of THRR including how the numbers were predicted and where they are located for reference       Expected Outcomes  Short Term: Able to state/look up THRR;Long Term: Able to use THRR to govern intensity when exercising independently;Short Term: Able to use daily as guideline for intensity in rehab       Able to check pulse independently  Yes       Intervention  Provide education and demonstration on how to check pulse in carotid and radial arteries.;Review the importance of being able to check your own pulse for safety during independent exercise       Expected Outcomes  Short Term: Able to explain why pulse checking is important during independent exercise;Long Term: Able to check pulse  independently and accurately       Understanding of Exercise Prescription  Yes       Intervention  Provide education, explanation, and written materials on patient's individual exercise prescription       Expected Outcomes  Short Term: Able to explain program exercise prescription;Long Term: Able to explain home exercise prescription to exercise independently          Exercise Goals Re-Evaluation : Exercise Goals Re-Evaluation    Row Name 11/05/18 1115 11/09/18 0914 11/17/18 1101         Exercise Goal Re-Evaluation   Exercise Goals Review  Increase Physical Activity;Able to understand and use rate of perceived exertion (RPE) scale;Knowledge and understanding of Target Heart Rate Range (THRR);Understanding of Exercise Prescription;Increase Strength and Stamina;Able to check pulse independently  Increase Physical Activity;Increase Strength and Stamina;Understanding of Exercise Prescription  Increase Physical Activity;Increase Strength and Stamina;Understanding of Exercise Prescription;Able to understand and use rate of perceived exertion (RPE) scale;Knowledge and understanding of Target Heart Rate Range (THRR);Able to check pulse independently     Comments  Reviewed RPE scale, THR and program prescription with pt today.  Pt voiced understanding and was given a copy of goals to take home.  Bruce Lucas is off to a good start in rehab.  He has some mylagias from his statin, but they are getting better and the exercise makes him feel better.  Once his legs recover, he would like to try the elliptical for a station.  We will continue to monitor his progression.  Reviewed home exercise with  pt today.  Pt plans to walking at home for exercise.  Reviewed THR, pulse, RPE, sign and symptoms, NTG use, and when to call 911 or MD.  Also discussed weather considerations and indoor options.  Pt voiced understanding.     Expected Outcomes  Short: Use RPE daily to regulate intensity. Long: Follow program prescription in THR.   Short: Continue to attend regularly.  Long; Continue to increase stamina and strength.  Short: Start walking at home on off days.  Long: Continue to increase activity levels        Discharge Exercise Prescription (Final Exercise Prescription Changes): Exercise Prescription Changes - 11/17/18 1100      Home Exercise Plan   Plans to continue exercise at  Home (comment)   walking   Frequency  Add 2 additional days to program exercise sessions.    Initial Home Exercises Provided  11/17/18       Nutrition:  Target Goals: Understanding of nutrition guidelines, daily intake of sodium '1500mg'$ , cholesterol '200mg'$ , calories 30% from fat and 7% or less from saturated fats, daily to have 5 or more servings of fruits and vegetables.  Biometrics: Pre Biometrics - 11/03/18 1321      Pre Biometrics   Height  5' 9.4" (1.763 m)    Weight  178 lb 9.6 oz (81 kg)    BMI (Calculated)  26.06    Single Leg Stand  1.09 seconds        Nutrition Therapy Plan and Nutrition Goals: Nutrition Therapy & Goals - 11/03/18 1211      Nutrition Therapy   Diet  Low Na, DM, HH diet    Protein (specify units)  70g    Fiber  30 grams    Whole Grain Foods  3 servings    Saturated Fats  12 max. grams    Fruits and Vegetables  5 servings/day    Sodium  1.5 grams      Personal Nutrition Goals   Nutrition Goal  ST: digest information LT: breathe better, gain weight back (200lbs goal, now about 173lbs)    Comments  Pt reports drs telling him to use a sliding slace for his insulin (bases insulin does on BG and not g of CHO eaten). Pt reports B: corn flakes with whole milk or whole wheat toast with eggs and bacon. L: processed cheese, whole wheat bread, ham. D:meat and vegetables (sometimes pasta) -sig other (girlfriend or wife) will make this meal. will sometimes have fried foods about 1x/month pt will drink diet soda and gatorade (half zero, half regular) with no water. sig other reports he is a very picky eater.  Discussed HH eating (more detail than usual as pt reports no knowledge of [facility] eating prior). Discussed CHO counting, pt reports they tried to have him take a class, but he was unable to. Pt reports losing weight after event and lost 30 lbs in 2 months. 13.5% in 2 months is significant.      Intervention Plan   Intervention  Prescribe, educate and counsel regarding individualized specific dietary modifications aiming towards targeted core components such as weight, hypertension, lipid management, diabetes, heart failure and other comorbidities.;Nutrition handout(s) given to patient.    Expected Outcomes  Short Term Goal: Understand basic principles of dietary content, such as calories, fat, sodium, cholesterol and nutrients.;Short Term Goal: A plan has been developed with personal nutrition goals set during dietitian appointment.;Long Term Goal: Adherence to prescribed nutrition plan.       Nutrition  Assessments: Nutrition Assessments - 11/03/18 0723      MEDFICTS Scores   Pre Score  19       Nutrition Goals Re-Evaluation:   Nutrition Goals Discharge (Final Nutrition Goals Re-Evaluation):   Psychosocial: Target Goals: Acknowledge presence or absence of significant depression and/or stress, maximize coping skills, provide positive support system. Participant is able to verbalize types and ability to use techniques and skills needed for reducing stress and depression.   Initial Review & Psychosocial Screening: Initial Psych Review & Screening - 10/30/18 1107      Initial Review   Current issues with  Current Stress Concerns    Source of Stress Concerns  Chronic Illness;Unable to perform yard/household activities;Unable to participate in former interests or hobbies    Comments  Bruce Lucas is doing well post CABG. He is still adjusting to learning his limitations after his stroke. He has issues forming his thoughts into words and some times he feels like his legs are "on backwards." His  girlfriend is very supportive and has taken over managing his healthcare.      Family Dynamics   Good Support System?  Yes      Barriers   Psychosocial barriers to participate in program  There are no identifiable barriers or psychosocial needs.      Screening Interventions   Interventions  Encouraged to exercise    Expected Outcomes  Short Term goal: Utilizing psychosocial counselor, staff and physician to assist with identification of specific Stressors or current issues interfering with healing process. Setting desired goal for each stressor or current issue identified.;Long Term Goal: Stressors or current issues are controlled or eliminated.;Short Term goal: Identification and review with participant of any Quality of Life or Depression concerns found by scoring the questionnaire.;Long Term goal: The participant improves quality of Life and PHQ9 Scores as seen by post scores and/or verbalization of changes       Quality of Life Scores:  Quality of Life - 11/03/18 0723      Quality of Life   Select  Quality of Life      Quality of Life Scores   Health/Function Pre  9.6 %    Socioeconomic Pre  14.93 %    Psych/Spiritual Pre  14.35 %    Family Pre  26.4 %    GLOBAL Pre  14.17 %      Scores of 19 and below usually indicate a poorer quality of life in these areas.  A difference of  2-3 points is a clinically meaningful difference.  A difference of 2-3 points in the total score of the Quality of Life Index has been associated with significant improvement in overall quality of life, self-image, physical symptoms, and general health in studies assessing change in quality of life.  PHQ-9: Recent Review Flowsheet Data    Depression screen [facility] 2/9 11/03/2018   Decreased Interest 2   Down, Depressed, Hopeless 1   PHQ - 2 Score 3   Altered sleeping 3   Tired, decreased energy 3   Change in appetite 3   Feeling bad or failure about yourself  1   Trouble concentrating 3   Moving slowly  or fidgety/restless 2   Suicidal thoughts 0   PHQ-9 Score 18   Difficult doing work/chores Extremely dIfficult     Interpretation of Total Score  Total Score Depression Severity:  1-4 = Minimal depression, 5-9 = Mild depression, 10-14 = Moderate depression, 15-19 = Moderately severe depression, 20-27 = Severe depression  Psychosocial Evaluation and Intervention:   Psychosocial Re-Evaluation:   Psychosocial Discharge (Final Psychosocial Re-Evaluation):   Vocational Rehabilitation: Provide vocational rehab assistance to qualifying candidates.   Vocational Rehab Evaluation & Intervention: Vocational Rehab - 10/30/18 1107      Initial Vocational Rehab Evaluation & Intervention   Assessment shows need for Vocational Rehabilitation  No       Education: Education Goals: Education classes will be provided on a variety of topics geared toward better understanding of heart health and risk factor modification. Participant will state understanding/return demonstration of topics presented as noted by education test scores.  Learning Barriers/Preferences: Learning Barriers/Preferences - 10/30/18 1106      Learning Barriers/Preferences   Learning Barriers  None    Learning Preferences  Individual Instruction       Education Topics:  AED/CPR: - Group verbal and written instruction with the use of models to demonstrate the basic use of the AED with the basic ABC's of resuscitation.   General Nutrition Guidelines/Fats and Fiber: -Group instruction provided by verbal, written material, models and posters to present the general guidelines for heart healthy nutrition. Gives an explanation and review of dietary fats and fiber.   Controlling Sodium/Reading Food Labels: -Group verbal and written material supporting the discussion of sodium use in heart healthy nutrition. Review and explanation with models, verbal and written materials for utilization of the food label.   Exercise  Physiology & General Exercise Guidelines: - Group verbal and written instruction with models to review the exercise physiology of the cardiovascular system and associated critical values. Provides general exercise guidelines with specific guidelines to those with heart or lung disease.    Aerobic Exercise & Resistance Training: - Gives group verbal and written instruction on the various components of exercise. Focuses on aerobic and resistive training programs and the benefits of this training and how to safely progress through these programs..   Flexibility, Balance, Mind/Body Relaxation: Provides group verbal/written instruction on the benefits of flexibility and balance training, including mind/body exercise modes such as yoga, pilates and tai chi.  Demonstration and skill practice provided.   Stress and Anxiety: - Provides group verbal and written instruction about the health risks of elevated stress and causes of high stress.  Discuss the correlation between heart/lung disease and anxiety and treatment options. Review healthy ways to manage with stress and anxiety.   Depression: - Provides group verbal and written instruction on the correlation between heart/lung disease and depressed mood, treatment options, and the stigmas associated with seeking treatment.   Anatomy & Physiology of the Heart: - Group verbal and written instruction and models provide basic cardiac anatomy and physiology, with the coronary electrical and arterial systems. Review of Valvular disease and Heart Failure   Cardiac Procedures: - Group verbal and written instruction to review commonly prescribed medications for heart disease. Reviews the medication, class of the drug, and side effects. Includes the steps to properly store meds and maintain the prescription regimen. (beta blockers and nitrates)   Cardiac Medications I: - Group verbal and written instruction to review commonly prescribed medications for  heart disease. Reviews the medication, class of the drug, and side effects. Includes the steps to properly store meds and maintain the prescription regimen.   Cardiac Medications II: -Group verbal and written instruction to review commonly prescribed medications for heart disease. Reviews the medication, class of the drug, and side effects. (all other drug classes)    Go Sex-Intimacy & Heart Disease, Get SMART - Goal Setting: -  Group verbal and written instruction through game format to discuss heart disease and the return to sexual intimacy. Provides group verbal and written material to discuss and apply goal setting through the application of the S.M.A.R.T. Method.   Other Matters of the Heart: - Provides group verbal, written materials and models to describe Stable Angina and Peripheral Artery. Includes description of the disease process and treatment options available to the cardiac patient.   Exercise & Equipment Safety: - Individual verbal instruction and demonstration of equipment use and safety with use of the equipment.   Cardiac Rehab from 11/03/2018 in [facility] Cardiac and Pulmonary Rehab  Date  11/03/18  Educator  [facility]  Instruction Review Code  1- Verbalizes Understanding      Infection Prevention: - Provides verbal and written material to individual with discussion of infection control including proper hand washing and proper equipment cleaning during exercise session.   Cardiac Rehab from 11/03/2018 in [facility] Cardiac and Pulmonary Rehab  Date  11/03/18  Educator  [facility]  Instruction Review Code  1- Verbalizes Understanding      Falls Prevention: - Provides verbal and written material to individual with discussion of falls prevention and safety.   Cardiac Rehab from 11/03/2018 in [facility] Cardiac and Pulmonary Rehab  Date  11/03/18  Educator  [facility]  Instruction Review Code  1- Verbalizes Understanding      Diabetes: - Individual verbal and written instruction to review  signs/symptoms of diabetes, desired ranges of glucose level fasting, after meals and with exercise. Acknowledge that pre and post exercise glucose checks will be done for 3 sessions at entry of program.   Cardiac Rehab from 11/03/2018 in [facility] Cardiac and Pulmonary Rehab  Date  10/30/18  Educator  [facility]  Instruction Review Code  1- Verbalizes Understanding      Know Your Numbers and Risk Factors: -Group verbal and written instruction about important numbers in your health.  Discussion of what are risk factors and how they play a role in the disease process.  Review of Cholesterol, Blood Pressure, Diabetes, and BMI and the role they play in your overall health.   Sleep Hygiene: -Provides group verbal and written instruction about how sleep can affect your health.  Define sleep hygiene, discuss sleep cycles and impact of sleep habits. Review good sleep hygiene tips.    Other: -Provides group and verbal instruction on various topics (see comments)   Knowledge Questionnaire Score: Knowledge Questionnaire Score - 11/03/18 0723      Knowledge Questionnaire Score   Pre Score  24/26 missed Heart Attack, Angina       Core Components/Risk Factors/Patient Goals at Admission: Personal Goals and Risk Factors at Admission - 11/03/18 1321      Core Components/Risk Factors/Patient Goals on Admission    Weight Management  Yes;Weight Gain    Intervention  Weight Management: Develop a combined nutrition and exercise program designed to reach desired caloric intake, while maintaining appropriate intake of nutrient and fiber, sodium and fats, and appropriate energy expenditure required for the weight goal.;Weight Management: Provide education and appropriate resources to help participant work on and attain dietary goals.    Admit Weight  178 lb 9.6 oz (81 kg)    Goal Weight: Short Term  185 lb (83.9 kg)    Expected Outcomes  Short Term: Continue to assess and modify interventions until short term weight is  achieved;Weight Gain: Understanding of general recommendations for a high calorie, high protein meal plan that promotes weight gain by  distributing calorie intake throughout the day with the consumption for 4-5 meals, snacks, and/or supplements;Understanding of distribution of calorie intake throughout the day with the consumption of 4-5 meals/snacks;Long Term: Adherence to nutrition and physical activity/exercise program aimed toward attainment of established weight goal    Diabetes  Yes    Intervention  Provide education about signs/symptoms and action to take for hypo/hyperglycemia.;Provide education about proper nutrition, including hydration, and aerobic/resistive exercise prescription along with prescribed medications to achieve blood glucose in normal ranges: Fasting glucose 65-99 mg/dL    Expected Outcomes  Short Term: Participant verbalizes understanding of the signs/symptoms and immediate care of hyper/hypoglycemia, proper foot care and importance of medication, aerobic/resistive exercise and nutrition plan for blood glucose control.;Long Term: Attainment of HbA1C < 7%.    Hypertension  Yes    Intervention  Provide education on lifestyle modifcations including regular physical activity/exercise, weight management, moderate sodium restriction and increased consumption of fresh fruit, vegetables, and low fat dairy, alcohol moderation, and smoking cessation.;Monitor prescription use compliance.    Expected Outcomes  Short Term: Continued assessment and intervention until BP is < 140/35m HG in hypertensive participants. < 130/862mHG in hypertensive participants with diabetes, heart failure or chronic kidney disease.;Long Term: Maintenance of blood pressure at goal levels.    Lipids  Yes    Intervention  Provide education and support for participant on nutrition & aerobic/resistive exercise along with prescribed medications to achieve LDL '70mg'$ , HDL >'40mg'$ .    Expected Outcomes  Short Term:  Participant states understanding of desired cholesterol values and is compliant with medications prescribed. Participant is following exercise prescription and nutrition guidelines.;Long Term: Cholesterol controlled with medications as prescribed, with individualized exercise RX and with personalized nutrition plan. Value goals: LDL < '70mg'$ , HDL > 40 mg.       Core Components/Risk Factors/Patient Goals Review:    Core Components/Risk Factors/Patient Goals at Discharge (Final Review):    ITP Comments: ITP Comments    Row Name 10/30/18 1038 11/03/18 1313 11/05/18 1114 11/18/18 0549     ITP Comments  Virtual Orientation completed. Diagnosis can be found in CE 5/28. EP/RD orientation scheduled for 7/28 at 11.  Completed 6MWT, gym orientation, and RD evaluation. Initial ITP created and sent for review to Dr. MaEmily FilbertMedical Director.  First full day of exercise!  Patient was oriented to gym and equipment including functions, settings, policies, and procedures.  Patient's individual exercise prescription and treatment plan were reviewed.  All starting workloads were established based on the results of the 6 minute walk test done at initial orientation visit.  The plan for exercise progression was also introduced and progression will be customized based on patient's performance and goals.  30 Day Review Completed today. Continue with ITP unless changed by Medical Director review.       Comments:

## 2018-11-19 ENCOUNTER — Other Ambulatory Visit: Payer: Self-pay

## 2018-11-19 ENCOUNTER — Encounter: Payer: 59 | Admitting: *Deleted

## 2018-11-19 DIAGNOSIS — I214 Non-ST elevation (NSTEMI) myocardial infarction: Secondary | ICD-10-CM | POA: Diagnosis not present

## 2018-11-19 DIAGNOSIS — Z951 Presence of aortocoronary bypass graft: Secondary | ICD-10-CM

## 2018-11-19 NOTE — Progress Notes (Signed)
Daily Session Note  Patient Details  Name: Bruce Lucas MRN: [id number] Date of Birth: 1961/01/19 Referring Provider:     Cardiac Rehab from 11/03/2018 in [facility] Cardiac and Pulmonary Rehab  Referring Provider  Celine Ahr MD      Encounter Date: 11/19/2018  Check In: Session Check In - 11/19/18 1103      Check-In   Supervising physician immediately available to respond to emergencies  See telemetry face sheet for immediately available ER MD    Location  [facility]-Cardiac & Pulmonary Rehab    Staff Present  Renita Papa, RN BSN;Laureen Owens Shark, BS, RRT, CPFT;Amanda Oletta Darter, BA, ACSM CEP, Exercise Physiologist    Virtual Visit  No    Medication changes reported      No    Fall or balance concerns reported     No    Warm-up and Cool-down  Performed on first and last piece of equipment    Resistance Training Performed  Yes    VAD Patient?  No    PAD/SET Patient?  No      Pain Assessment   Currently in Pain?  No/denies          Social History   Tobacco Use  Smoking Status Former Smoker  Smokeless Tobacco Never Used    Goals Met:  Independence with exercise equipment Exercise tolerated well No report of cardiac concerns or symptoms Strength training completed today  Goals Unmet:  Not Applicable  Comments: Pt able to follow exercise prescription today without complaint.  Will continue to monitor for progression.    Dr. Emily Filbert is Medical Director for [place] and [facility].

## 2018-11-20 ENCOUNTER — Encounter: Payer: 59 | Admitting: *Deleted

## 2018-11-20 DIAGNOSIS — I214 Non-ST elevation (NSTEMI) myocardial infarction: Secondary | ICD-10-CM | POA: Diagnosis not present

## 2018-11-20 DIAGNOSIS — Z951 Presence of aortocoronary bypass graft: Secondary | ICD-10-CM

## 2018-11-20 NOTE — Progress Notes (Signed)
Daily Session Note  Patient Details  Name: Bruce Lucas MRN: [id number] Date of Birth: Aug 29, 1960 Referring Provider:     Cardiac Rehab from 11/03/2018 in [facility] Cardiac and Pulmonary Rehab  Referring Provider  Celine Ahr MD      Encounter Date: 11/20/2018  Check In: Session Check In - 11/20/18 1103      Check-In   Supervising physician immediately available to respond to emergencies  See telemetry face sheet for immediately available ER MD    Location  [facility]-Cardiac & Pulmonary Rehab    Staff Present  Renita Papa, RN Vickki Hearing, BA, ACSM CEP, Exercise Physiologist;Joseph Tessie Fass RCP,RRT,BSRT    Virtual Visit  No    Medication changes reported      No    Fall or balance concerns reported     No    Warm-up and Cool-down  Performed on first and last piece of equipment    Resistance Training Performed  Yes    VAD Patient?  No      Pain Assessment   Currently in Pain?  No/denies          Social History   Tobacco Use  Smoking Status Former Smoker  Smokeless Tobacco Never Used    Goals Met:  Independence with exercise equipment Exercise tolerated well No report of cardiac concerns or symptoms Strength training completed today  Goals Unmet:  Not Applicable  Comments: Pt able to follow exercise prescription today without complaint.  Will continue to monitor for progression.    Dr. Emily Filbert is Medical Director for [place] and [facility].

## 2018-11-24 ENCOUNTER — Telehealth: Payer: Self-pay | Admitting: *Deleted

## 2018-11-24 NOTE — Telephone Encounter (Signed)
Spoke with Bruce Lucas- He is having muscle pain that has increased since exercising . He sees his MD this week  Had statin stopped about a month ago because of pain. He expects to return Thursday.

## 2018-11-26 ENCOUNTER — Encounter: Payer: 59 | Admitting: *Deleted

## 2018-11-26 ENCOUNTER — Other Ambulatory Visit: Payer: Self-pay

## 2018-11-26 DIAGNOSIS — I214 Non-ST elevation (NSTEMI) myocardial infarction: Secondary | ICD-10-CM | POA: Diagnosis not present

## 2018-11-26 DIAGNOSIS — Z951 Presence of aortocoronary bypass graft: Secondary | ICD-10-CM

## 2018-11-26 NOTE — Progress Notes (Signed)
Daily Session Note  Patient Details  Name: Bruce Lucas MRN: [id number] Date of Birth: January 11, 1961 Referring Provider:     Cardiac Rehab from 11/03/2018 in [facility] Cardiac and Pulmonary Rehab  Referring Provider  Celine Ahr MD      Encounter Date: 11/26/2018  Check In:      Social History   Tobacco Use  Smoking Status Former Smoker  Smokeless Tobacco Never Used    Goals Met:  Independence with exercise equipment No report of cardiac concerns or symptoms Strength training completed today  Goals Unmet:  Not Applicable  Comments: [name] continues to have pain with walking . Saw PMD yesterday and now has referral to Vascular MD.  Will modify his exercise to more upper body and what he can tolerate lower extremity work.    Dr. Emily Filbert is Medical Director for [place] and [facility].

## 2018-11-27 ENCOUNTER — Encounter: Payer: 59 | Admitting: *Deleted

## 2018-11-27 DIAGNOSIS — I214 Non-ST elevation (NSTEMI) myocardial infarction: Secondary | ICD-10-CM | POA: Diagnosis not present

## 2018-11-27 DIAGNOSIS — Z951 Presence of aortocoronary bypass graft: Secondary | ICD-10-CM

## 2018-11-27 NOTE — Progress Notes (Signed)
Daily Session Note  Patient Details  Name: Bruce Lucas MRN: [id number] Date of Birth: [birth year] Referring Provider:     Cardiac Rehab from 11/03/2018 in [facility] Cardiac and Pulmonary Rehab  Referring Provider  Celine Ahr MD      Encounter Date: 11/27/2018  Check In: Session Check In - 11/27/18 1056      Check-In   Supervising physician immediately available to respond to emergencies  See telemetry face sheet for immediately available ER MD    Location  [facility]-Cardiac & Pulmonary Rehab    Staff Present  Heath Lark, RN, BSN, CCRP;Jessica [place], MA, RCEP, CCRP, CCET    Virtual Visit  No    Medication changes reported      No    Fall or balance concerns reported     No    Warm-up and Cool-down  Performed on first and last piece of equipment    Resistance Training Performed  Yes    VAD Patient?  No    PAD/SET Patient?  No      Pain Assessment   Currently in Pain?  No/denies          Social History   Tobacco Use  Smoking Status Former Smoker  Smokeless Tobacco Never Used    Goals Met:  Exercise tolerated well No report of cardiac concerns or symptoms Strength training completed today  Goals Unmet:  Not Applicable  Comments: Pt able to follow exercise prescription today without complaint.  Will continue to monitor for progression.  Still waiting for Vascular MD appt  Dr. Emily Filbert is Medical Director for [place] and [facility].

## 2018-12-02 ENCOUNTER — Encounter: Payer: 59 | Admitting: *Deleted

## 2018-12-02 ENCOUNTER — Other Ambulatory Visit: Payer: Self-pay

## 2018-12-02 DIAGNOSIS — Z951 Presence of aortocoronary bypass graft: Secondary | ICD-10-CM

## 2018-12-02 DIAGNOSIS — I214 Non-ST elevation (NSTEMI) myocardial infarction: Secondary | ICD-10-CM | POA: Diagnosis not present

## 2018-12-02 NOTE — Progress Notes (Signed)
Daily Session Note  Patient Details  Name: BASHIR MARCHETTI MRN: [id number] Date of Birth: [birth year] Referring Provider:     Cardiac Rehab from 11/03/2018 in [facility] Cardiac and Pulmonary Rehab  Referring Provider  Celine Ahr MD      Encounter Date: 12/02/2018  Check In: Session Check In - 12/02/18 1630      Check-In   Supervising physician immediately available to respond to emergencies  See telemetry face sheet for immediately available ER MD    Location  [facility]-Cardiac & Pulmonary Rehab    Staff Present  Renita Papa, RN Vickki Hearing, BA, ACSM CEP, Exercise Physiologist;Melissa Caiola RDN, LDN    Virtual Visit  No    Medication changes reported      No    Fall or balance concerns reported     No    Warm-up and Cool-down  Performed on first and last piece of equipment    Resistance Training Performed  Yes    VAD Patient?  No    PAD/SET Patient?  No      Pain Assessment   Currently in Pain?  No/denies          Social History   Tobacco Use  Smoking Status Former Smoker  Smokeless Tobacco Never Used    Goals Met:  Independence with exercise equipment Exercise tolerated well No report of cardiac concerns or symptoms Strength training completed today  Goals Unmet:  Not Applicable  Comments: Pt able to follow exercise prescription today without complaint.  Will continue to monitor for progression.    Dr. Emily Filbert is Medical Director for [place] and [facility].

## 2018-12-03 ENCOUNTER — Other Ambulatory Visit (INDEPENDENT_AMBULATORY_CARE_PROVIDER_SITE_OTHER): Payer: Self-pay | Admitting: Vascular Surgery

## 2018-12-03 ENCOUNTER — Ambulatory Visit (INDEPENDENT_AMBULATORY_CARE_PROVIDER_SITE_OTHER): Payer: 59

## 2018-12-03 ENCOUNTER — Ambulatory Visit (INDEPENDENT_AMBULATORY_CARE_PROVIDER_SITE_OTHER): Payer: 59 | Admitting: Vascular Surgery

## 2018-12-03 ENCOUNTER — Encounter (INDEPENDENT_AMBULATORY_CARE_PROVIDER_SITE_OTHER): Payer: Self-pay | Admitting: Vascular Surgery

## 2018-12-03 VITALS — BP 133/73 | HR 85 | Resp 16 | Ht 71.0 in | Wt 191.0 lb

## 2018-12-03 DIAGNOSIS — E1159 Type 2 diabetes mellitus with other circulatory complications: Secondary | ICD-10-CM | POA: Diagnosis not present

## 2018-12-03 DIAGNOSIS — Z794 Long term (current) use of insulin: Secondary | ICD-10-CM

## 2018-12-03 DIAGNOSIS — I739 Peripheral vascular disease, unspecified: Secondary | ICD-10-CM

## 2018-12-03 DIAGNOSIS — M79606 Pain in leg, unspecified: Secondary | ICD-10-CM | POA: Diagnosis not present

## 2018-12-03 DIAGNOSIS — I70213 Atherosclerosis of native arteries of extremities with intermittent claudication, bilateral legs: Secondary | ICD-10-CM | POA: Diagnosis not present

## 2018-12-03 DIAGNOSIS — I2510 Atherosclerotic heart disease of native coronary artery without angina pectoris: Secondary | ICD-10-CM

## 2018-12-03 DIAGNOSIS — I10 Essential (primary) hypertension: Secondary | ICD-10-CM | POA: Diagnosis not present

## 2018-12-03 DIAGNOSIS — E782 Mixed hyperlipidemia: Secondary | ICD-10-CM

## 2018-12-03 NOTE — Progress Notes (Signed)
MRN : [id number]  Bruce Lucas is a 58 y.o. (1960-04-27) male who presents with chief complaint of  Chief Complaint  Patient presents with  . New Patient (Initial Visit)    ref [place] leg pain  .  History of Present Illness:   The patient is seen for evaluation of painful lower extremities. Patient notes the pain is variable and not always associated with activity.  The pain is somewhat consistent day to day occurring on most days. The patient notes the pain also occurs with standing and routinely seems worse as the day wears on. The pain has been progressive over the past several years. The patient states these symptoms are causing  a profound negative impact on quality of life and daily activities.  The patient denies rest pain or dangling of an extremity off the side of the bed during the night for relief. No open wounds or sores at this time. No history of DVT or phlebitis. No prior interventions or surgeries.  There is a  history of back problems and DJD of the lumbar and sacral spine.   ABI's Rt=0.90 (triphasic PT) and Lt=1.20 (monophasic tibial signals)   Current Meds  Medication Sig  . aspirin 81 MG chewable tablet Chew 324 mg by mouth daily.  Marland Kitchen gabapentin (NEURONTIN) 600 MG tablet Take 300 mg by mouth 3 (three) times daily.   . Insulin Glargine (BASAGLAR KWIKPEN) 100 UNIT/ML SOPN Inject 26 Units into the skin at bedtime.   Marland Kitchen MAGNESIUM PO Take 400 mg by mouth.  . Melatonin 5 MG TABS Take 10 mg by mouth at bedtime.   . metoprolol succinate (TOPROL-XL) 50 MG 24 hr tablet Take 50 mg by mouth daily.  . nitroGLYCERIN (NITROSTAT) 0.4 MG SL tablet Place 0.4 mg under the tongue every 5 (five) minutes x 3 doses as needed for chest pain.  Marland Kitchen NOVOLOG FLEXPEN 100 UNIT/ML FlexPen Inject 0-14 Units into the skin 3 (three) times daily with meals.    Past Medical History:  Diagnosis Date  . Coronary artery disease   . Diabetes mellitus without complication [facility])     Past  Surgical History:  Procedure Laterality Date  . CARDIAC SURGERY    . KNEE SURGERY Bilateral   . TONSILLECTOMY      Social History Social History   Tobacco Use  . Smoking status: Former Research scientist (life sciences)  . Smokeless tobacco: Never Used  Substance Use Topics  . Alcohol use: No  . Drug use: No    Family History Family History  Problem Relation Age of Onset  . Brain cancer Mother   . Hypertension Father   No family history of bleeding/clotting disorders, porphyria or autoimmune disease   Allergies  Allergen Reactions  . Codeine   . Penicillins      REVIEW OF SYSTEMS (Negative unless checked)  Constitutional: [] Weight loss  [] Fever  [] Chills Cardiac: [x] Chest pain   [] Chest pressure   [] Palpitations   [] Shortness of breath when laying flat   [] Shortness of breath with exertion. Vascular:  [x] Pain in legs with walking   [x] Pain in legs at rest  [] History of DVT   [] Phlebitis   [x] Swelling in legs   [] Varicose veins   [] Non-healing ulcers Pulmonary:   [] Uses home oxygen   [] Productive cough   [] Hemoptysis   [] Wheeze  [] COPD   [] Asthma Neurologic:  [] Dizziness   [] Seizures   [x] History of stroke   [] History of TIA  [] Aphasia   [] Vissual changes   [] Weakness  or numbness in arm   [] Weakness or numbness in leg Musculoskeletal:   [] Joint swelling   [] Joint pain   [] Low back pain Hematologic:  [] Easy bruising  [] Easy bleeding   [] Hypercoagulable state   [] Anemic Gastrointestinal:  [] Diarrhea   [] Vomiting  [] Gastroesophageal reflux/heartburn   [] Difficulty swallowing. Genitourinary:  [] Chronic kidney disease   [] Difficult urination  [] Frequent urination   [] Blood in urine Skin:  [] Rashes   [] Ulcers  Psychological:  [] History of anxiety   []  History of major depression.  Physical Examination  Vitals:   12/03/18 0829  BP: 133/73  Pulse: 85  Resp: 16  Weight: 191 lb (86.6 kg)  Height: 5\' 11"  (1.803 m)   Body mass index is 26.64 kg/m. Gen: WD/WN, NAD Head: [place]/AT, No temporalis  wasting.  Ear/Nose/Throat: Hearing grossly intact, nares w/o erythema or drainage, poor dentition Eyes: PER, EOMI, sclera nonicteric.  Neck: Supple, no masses.  No bruit or JVD.  Pulmonary:  Good air movement, clear to auscultation bilaterally, no use of accessory muscles.  Cardiac: RRR, normal S1, S2, no Murmurs. Vascular: Mild venous stasis changes to the legs bilaterally.  2+ soft pitting edema Vessel Right Left  Radial Not Palpable Not Palpable  DP Not Palpable Not Palpable  Gastrointestinal: soft, non-distended. No guarding/no peritoneal signs.  Musculoskeletal: M/S 5/5 throughout.  No deformity or atrophy.  Neurologic: CN 2-12 intact. Pain and light touch intact in extremities.  Symmetrical.  Speech is fluent. Motor exam as listed above. Psychiatric: Judgment intact, Mood & affect appropriate for pt's clinical situation. Dermatologic: No rashes or ulcers noted.  No changes consistent with cellulitis. Lymph : No Cervical lymphadenopathy, no lichenification or skin changes of chronic lymphedema.  CBC Lab Results  Component Value Date   WBC 14.9 (H) 09/13/2018   HGB 11.1 (L) 09/13/2018   HCT 33.5 (L) 09/13/2018   MCV 85.5 09/13/2018   PLT 582 (H) 09/13/2018    BMET    Component Value Date/Time   NA 135 09/13/2018 1407   K 4.4 09/13/2018 1407   CL 102 09/13/2018 1407   CO2 25 09/13/2018 1407   GLUCOSE 192 (H) 09/13/2018 1407   BUN 21 (H) 09/13/2018 1407   CREATININE 0.85 09/13/2018 1407   CALCIUM 8.5 (L) 09/13/2018 1407   GFRNONAA >60 09/13/2018 1407   GFRAA >60 09/13/2018 1407   CrCl cannot be calculated (Patient's most recent lab result is older than the maximum 21 days allowed.).  COAG No results found for: INR, PROTIME  Radiology No results found.   Assessment/Plan 1. Atherosclerosis of native artery of both lower extremities with intermittent claudication (HCC)  Recommend:  The patient has atypical pain symptoms for pure atherosclerotic disease. However,  on physical exam there is evidence of arterial disease, given the diminished pulses.  Noninvasive studies, aorta iliac duplex,  will be obtained and the patient will follow up with me to review these studies.  The patient should continue walking and begin a more formal exercise program. The patient should continue his antiplatelet therapy and aggressive treatment of the lipid abnormalities.  The patient should begin wearing graduated compression socks 15-20 mmHg strength to control edema.   - VAS US AORTA/IVC/ILIACS; Future  2. CAD, multiple vessel Continue cardiac and antihypertensive medications as already ordered and reviewed, no changes at this time.  Continue statin as ordered and reviewed, no changes at this time  Nitrates PRN for chest pain   3. Essential hypertension Continue antihypertensive medications as already ordered, these medications have  been reviewed and there are no changes at this time.   4. Type 2 diabetes mellitus with other circulatory complication, with long-term current use of insulin (HCC) Continue hypoglycemic medications as already ordered, these medications have been reviewed and there are no changes at this time.  Hgb A1C to be monitored as already arranged by primary service   5. Mixed hyperlipidemia Continue statin as ordered and reviewed, no changes at this time    Hortencia Pilar, MD  12/03/2018 8:54 AM

## 2018-12-05 ENCOUNTER — Encounter (INDEPENDENT_AMBULATORY_CARE_PROVIDER_SITE_OTHER): Payer: Self-pay | Admitting: Vascular Surgery

## 2018-12-05 DIAGNOSIS — I70219 Atherosclerosis of native arteries of extremities with intermittent claudication, unspecified extremity: Secondary | ICD-10-CM | POA: Insufficient documentation

## 2018-12-05 DIAGNOSIS — E785 Hyperlipidemia, unspecified: Secondary | ICD-10-CM | POA: Insufficient documentation

## 2018-12-07 ENCOUNTER — Other Ambulatory Visit: Payer: Self-pay

## 2018-12-07 ENCOUNTER — Encounter: Payer: Self-pay | Admitting: *Deleted

## 2018-12-07 ENCOUNTER — Encounter (INDEPENDENT_AMBULATORY_CARE_PROVIDER_SITE_OTHER): Payer: Self-pay | Admitting: Vascular Surgery

## 2018-12-07 ENCOUNTER — Ambulatory Visit (INDEPENDENT_AMBULATORY_CARE_PROVIDER_SITE_OTHER): Payer: 59 | Admitting: Vascular Surgery

## 2018-12-07 ENCOUNTER — Ambulatory Visit (INDEPENDENT_AMBULATORY_CARE_PROVIDER_SITE_OTHER): Payer: 59

## 2018-12-07 VITALS — BP 138/73 | HR 66 | Resp 16 | Wt 186.8 lb

## 2018-12-07 DIAGNOSIS — Z951 Presence of aortocoronary bypass graft: Secondary | ICD-10-CM

## 2018-12-07 DIAGNOSIS — I70213 Atherosclerosis of native arteries of extremities with intermittent claudication, bilateral legs: Secondary | ICD-10-CM | POA: Diagnosis not present

## 2018-12-07 DIAGNOSIS — Z794 Long term (current) use of insulin: Secondary | ICD-10-CM

## 2018-12-07 DIAGNOSIS — M5410 Radiculopathy, site unspecified: Secondary | ICD-10-CM | POA: Diagnosis not present

## 2018-12-07 DIAGNOSIS — I2510 Atherosclerotic heart disease of native coronary artery without angina pectoris: Secondary | ICD-10-CM | POA: Diagnosis not present

## 2018-12-07 DIAGNOSIS — I10 Essential (primary) hypertension: Secondary | ICD-10-CM | POA: Diagnosis not present

## 2018-12-07 DIAGNOSIS — I739 Peripheral vascular disease, unspecified: Secondary | ICD-10-CM

## 2018-12-07 DIAGNOSIS — E1159 Type 2 diabetes mellitus with other circulatory complications: Secondary | ICD-10-CM

## 2018-12-07 NOTE — Progress Notes (Signed)
MRN : [id number]  Bruce Lucas is a 58 y.o. (1960/08/11) male who presents with chief complaint of  Chief Complaint  Patient presents with   Follow-up    ultrasound follow up  .  History of Present Illness:  The patient returns to the office for followup and review of the noninvasive studies. There have been no interval changes in lower extremity symptoms. No interval shortening of the patient's claudication distance or development of rest pain symptoms. No new ulcers or wounds have occurred since the last visit.  The patient continues to complain of pain radiating down the lateral aspect of his legs.  He again states that the right leg is substantially worse than the left.  There have been no significant changes to the patient's overall health care.  The patient denies amaurosis fugax or recent TIA symptoms. There are no recent neurological changes noted. The patient denies history of DVT, PE or superficial thrombophlebitis. The patient denies recent episodes of angina or shortness of breath.   Duplex ultrasound of the aorta iliac system demonstrates uniform velocities throughout.  No evidence of hemodynamically significant iliac lesion.  Of note his abdominal aorta measures 2.6 cm in maximal diameter.   Current Meds  Medication Sig   amiodarone (PACERONE) 200 MG tablet Take 200 mg by mouth daily.   aspirin 81 MG chewable tablet Chew 81 mg by mouth daily.    atorvastatin (LIPITOR) 40 MG tablet Take 40 mg by mouth daily.   gabapentin (NEURONTIN) 600 MG tablet Take 300 mg by mouth 3 (three) times daily.    HUMALOG KWIKPEN 100 UNIT/ML KwikPen Inject 0-14 Units into the skin 3 (three) times daily with meals.   Insulin Glargine (BASAGLAR KWIKPEN) 100 UNIT/ML SOPN Inject 26 Units into the skin at bedtime.    isosorbide mononitrate (IMDUR) 30 MG 24 hr tablet Take 30 mg by mouth daily.   losartan (COZAAR) 25 MG tablet Take 25 mg by mouth daily.   MAGNESIUM PO Take 400 mg by  mouth.   Melatonin 5 MG TABS Take 10 mg by mouth at bedtime.    metoprolol succinate (TOPROL-XL) 50 MG 24 hr tablet Take 50 mg by mouth daily.   nitroGLYCERIN (NITROSTAT) 0.4 MG SL tablet Place 0.4 mg under the tongue every 5 (five) minutes x 3 doses as needed for chest pain.   NOVOLOG FLEXPEN 100 UNIT/ML FlexPen Inject 0-14 Units into the skin 3 (three) times daily with meals.   sertraline (ZOLOFT) 50 MG tablet Take 50 mg by mouth daily.    Past Medical History:  Diagnosis Date   Coronary artery disease    Diabetes mellitus without complication ([place])     Past Surgical History:  Procedure Laterality Date   CARDIAC SURGERY     KNEE SURGERY Bilateral    TONSILLECTOMY      Social History Social History   Tobacco Use   Smoking status: Former Smoker   Smokeless tobacco: Never Used  Substance Use Topics   Alcohol use: No   Drug use: No    Family History Family History  Problem Relation Age of Onset   Brain cancer Mother    Hypertension Father     Allergies  Allergen Reactions   Codeine    Penicillins      REVIEW OF SYSTEMS (Negative unless checked)  Constitutional: [] Weight loss  [] Fever  [] Chills Cardiac: [] Chest pain   [] Chest pressure   [] Palpitations   [] Shortness of breath when laying flat   []   Shortness of breath with exertion. Vascular:  [x] Pain in legs with walking   [x] Pain in legs at rest  [] History of DVT   [] Phlebitis   [] Swelling in legs   [] Varicose veins   [] Non-healing ulcers Pulmonary:   [] Uses home oxygen   [] Productive cough   [] Hemoptysis   [] Wheeze  [] COPD   [] Asthma Neurologic:  [] Dizziness   [] Seizures   [] History of stroke   [] History of TIA  [] Aphasia   [] Vissual changes   [] Weakness or numbness in arm   [x] Weakness or numbness in leg Musculoskeletal:   [] Joint swelling   [x] Joint pain   [x] Low back pain Hematologic:  [] Easy bruising  [] Easy bleeding   [] Hypercoagulable state   [] Anemic Gastrointestinal:  [] Diarrhea    [] Vomiting  [] Gastroesophageal reflux/heartburn   [] Difficulty swallowing. Genitourinary:  [] Chronic kidney disease   [] Difficult urination  [] Frequent urination   [] Blood in urine Skin:  [] Rashes   [] Ulcers  Psychological:  [] History of anxiety   []  History of major depression.  Physical Examination  Vitals:   12/07/18 0821  BP: 138/73  Pulse: 66  Resp: 16  Weight: 186 lb 12.8 oz (84.7 kg)   Body mass index is 26.05 kg/m. Gen: WD/WN, NAD Head: [place]/AT, No temporalis wasting.  Ear/Nose/Throat: Hearing grossly intact, nares w/o erythema or drainage Eyes: PER, EOMI, sclera nonicteric.  Neck: Supple, no large masses.   Pulmonary:  Good air movement, no audible wheezing bilaterally, no use of accessory muscles.  Cardiac: RRR, no JVD Vascular:  Vessel Right Left  Radial Palpable Palpable  PT  trace palpable  not palpable  DP  trace palpable  not palpable  Gastrointestinal: Non-distended. No guarding/no peritoneal signs.  Musculoskeletal: M/S 5/5 throughout.  No deformity or atrophy.  Neurologic: CN 2-12 intact. Symmetrical.  Speech is fluent. Motor exam as listed above. Psychiatric: Judgment intact, Mood & affect appropriate for pt's clinical situation. Dermatologic: No rashes or ulcers noted.  No changes consistent with cellulitis. Lymph : No lichenification or skin changes of chronic lymphedema.  CBC Lab Results  Component Value Date   WBC 14.9 (H) 09/13/2018   HGB 11.1 (L) 09/13/2018   HCT 33.5 (L) 09/13/2018   MCV 85.5 09/13/2018   PLT 582 (H) 09/13/2018    BMET    Component Value Date/Time   NA 135 09/13/2018 1407   K 4.4 09/13/2018 1407   CL 102 09/13/2018 1407   CO2 25 09/13/2018 1407   GLUCOSE 192 (H) 09/13/2018 1407   BUN 21 (H) 09/13/2018 1407   CREATININE 0.85 09/13/2018 1407   CALCIUM 8.5 (L) 09/13/2018 1407   GFRNONAA >60 09/13/2018 1407   GFRAA >60 09/13/2018 1407   CrCl cannot be calculated (Patient's most recent lab result is older than the maximum  21 days allowed.).  COAG No results found for: INR, PROTIME  Radiology Vas [place] Burnard Bunting With/wo Tbi  Result Date: 12/07/2018 LOWER EXTREMITY DOPPLER STUDY Indications: Claudication, and rest pain.  Performing Technologist: Almira Coaster RVS  Examination Guidelines: A complete evaluation includes at minimum, Doppler waveform signals and systolic blood pressure reading at the level of bilateral brachial, anterior tibial, and posterior tibial arteries, when vessel segments are accessible. Bilateral testing is considered an integral part of a complete examination. Photoelectric Plethysmograph (PPG) waveforms and toe systolic pressure readings are included as required and additional duplex testing as needed. Limited examinations for reoccurring indications may be performed as noted.  ABI Findings: +---------+------------------+-----+----------+--------+  Right     Rt Pressure (mmHg) Index  Waveform   Comment   +---------+------------------+-----+----------+--------+  Brachial  124                                           +---------+------------------+-----+----------+--------+  ATA       124                0.90  monophasic           +---------+------------------+-----+----------+--------+  PTA       250                1.81  triphasic  [place]        +---------+------------------+-----+----------+--------+  Great Toe 116                0.84  Normal               +---------+------------------+-----+----------+--------+ +---------+------------------+-----+----------+-------+  Left      Lt Pressure (mmHg) Index Waveform   Comment  +---------+------------------+-----+----------+-------+  Brachial  138                                          +---------+------------------+-----+----------+-------+  ATA       95                 0.69  monophasic          +---------+------------------+-----+----------+-------+  PTA       165                1.20  monophasic          +---------+------------------+-----+----------+-------+  Great  Toe 152                1.10  Normal              +---------+------------------+-----+----------+-------+ +-------+-----------+-----------+------------+------------+  ABI/TBI Today's ABI Today's TBI Previous ABI Previous TBI  +-------+-----------+-----------+------------+------------+  Right   >1.0 [place]     .84                                    +-------+-----------+-----------+------------+------------+  Left    1.20        1.10                                   +-------+-----------+-----------+------------+------------+  Summary: Right: Resting right ankle-brachial index is within normal range. No evidence of significant right lower extremity arterial disease. The right toe-brachial index is normal. Imaging and Waveforms obtained at the level of the Ankle. Left: Resting left ankle-brachial index is within normal range. No evidence of significant left lower extremity arterial disease. The left toe-brachial index is normal. ABIs are unreliable. Imaging and Waveforms obtained at the level of the Ankle.  *See table(s) above for measurements and observations.  Electronically signed by Hortencia Pilar MD on 12/07/2018 at 8:19:28 AM.    Final    Vas US Aorta/ivc/iliacs  Result Date: 12/07/2018 ABDOMINAL AORTA STUDY Indications: Follow up exam for known AAA.  Performing Technologist: Almira Coaster RVS  Examination Guidelines: A complete evaluation includes B-mode imaging, spectral Doppler, color Doppler, and power Doppler as needed of all accessible portions of each vessel. Bilateral  testing is considered an integral part of a complete examination. Limited examinations for reoccurring indications may be performed as noted.  Abdominal Aorta Findings: +-------------+-------+----------+----------+---------+--------+--------+  Location      AP (cm) Trans (cm) PSV (cm/s) Waveform  Thrombus Comments  +-------------+-------+----------+----------+---------+--------+--------+  Proximal      2.18    2.49       85         biphasic                      +-------------+-------+----------+----------+---------+--------+--------+  Mid           2.33    2.39       85         biphasic                     +-------------+-------+----------+----------+---------+--------+--------+  Distal        2.63    2.56       124        biphasic                     +-------------+-------+----------+----------+---------+--------+--------+  RT CIA Prox   1.0     1.1        148        biphasic                     +-------------+-------+----------+----------+---------+--------+--------+  RT CIA Distal 1.1     1.1        105        biphasic                     +-------------+-------+----------+----------+---------+--------+--------+  RT EIA Prox   1.2     1.3        138        triphasic                    +-------------+-------+----------+----------+---------+--------+--------+  RT EIA Distal 1.2     1.2        146        biphasic                     +-------------+-------+----------+----------+---------+--------+--------+  LT CIA Prox   1.2     1.3        95         biphasic                     +-------------+-------+----------+----------+---------+--------+--------+  LT CIA Distal 1.0     1.1        96         biphasic                     +-------------+-------+----------+----------+---------+--------+--------+  LT EIA Prox   0.8     0.9        132        biphasic                     +-------------+-------+----------+----------+---------+--------+--------+  LT EIA Distal 0.9     1.0        110        biphasic                     +-------------+-------+----------+----------+---------+--------+--------+  Summary: Abdominal Aorta: There is evidence of abnormal dilatation of the distal, mid and proximal Abdominal  aorta. There is evidence of abnormal dilation of the Right Common Iliac artery and Left Common Iliac artery. The largest aortic measurement is 2.6 cm. Mildly Dilated Aorta and Common Iliac Arteries with calcified plaque seen.  *See table(s) above for measurements  and observations.  Electronically signed by Hortencia Pilar MD on 12/07/2018 at 8:18:41 AM.   Final      Assessment/Plan 1. Atherosclerosis of native artery of both lower extremities with intermittent claudication (HCC)  Recommend:  The patient has evidence of atherosclerosis of the lower extremities with claudication.  The patient does not voice lifestyle limiting changes at this point in time.  Noninvasive studies do not suggest clinically significant change.  No invasive studies, angiography or surgery at this time The patient should continue walking and begin a more formal exercise program.  The patient should continue antiplatelet therapy and aggressive treatment of the lipid abnormalities  No changes in the patient's medications at this time   A total of 30 minutes was spent with this patient and greater than 50% was spent in counseling and coordination of care with the patient.  Discussion included the treatment options for vascular disease including indications for surgery and intervention.  Also discussed is the appropriate timing of treatment.  In addition medical therapy was discussed.  - VAS [place] LOWER EXTREMITY ARTERIAL DUPLEX; Future - VAS [place] ABI WITH/WO TBI; Future  2. Radicular leg pain Further treatment deferred to primary service  3. CAD, multiple vessel Continue cardiac and antihypertensive medications as already ordered and reviewed, no changes at this time.  Continue statin as ordered and reviewed, no changes at this time  Nitrates PRN for chest pain   4. Essential hypertension Continue antihypertensive medications as already ordered, these medications have been reviewed and there are no changes at this time.   5. Type 2 diabetes mellitus with other circulatory complication, with long-term current use of insulin (HCC) Continue hypoglycemic medications as already ordered, these medications have been reviewed and there are no changes at this time.  Hgb A1C to  be monitored as already arranged by primary service    Hortencia Pilar, MD  12/07/2018 8:38 AM

## 2018-12-08 ENCOUNTER — Encounter: Payer: 59 | Attending: Cardiology

## 2018-12-08 DIAGNOSIS — I25810 Atherosclerosis of coronary artery bypass graft(s) without angina pectoris: Secondary | ICD-10-CM | POA: Insufficient documentation

## 2018-12-08 DIAGNOSIS — Z951 Presence of aortocoronary bypass graft: Secondary | ICD-10-CM | POA: Insufficient documentation

## 2018-12-08 DIAGNOSIS — I214 Non-ST elevation (NSTEMI) myocardial infarction: Secondary | ICD-10-CM | POA: Insufficient documentation

## 2018-12-09 DIAGNOSIS — M5410 Radiculopathy, site unspecified: Secondary | ICD-10-CM | POA: Insufficient documentation

## 2018-12-11 ENCOUNTER — Encounter (INDEPENDENT_AMBULATORY_CARE_PROVIDER_SITE_OTHER): Payer: Self-pay | Admitting: Vascular Surgery

## 2018-12-15 NOTE — Progress Notes (Signed)
Unable to complete nutrition 30-day re-eval cycle due to inconsistent attendence

## 2018-12-16 ENCOUNTER — Encounter: Payer: Self-pay | Admitting: *Deleted

## 2018-12-16 DIAGNOSIS — Z951 Presence of aortocoronary bypass graft: Secondary | ICD-10-CM

## 2018-12-16 NOTE — Progress Notes (Signed)
Cardiac Individual Treatment Plan  Patient Details  Name: Bruce Lucas MRN: [id number] Date of Birth: 15-Dec-1960 Referring Provider:     Cardiac Rehab from 11/03/2018 in [facility] Cardiac and Pulmonary Rehab  Referring Provider  Celine Ahr MD      Initial Encounter Date:    Cardiac Rehab from 11/03/2018 in [facility] Cardiac and Pulmonary Rehab  Date  11/03/18      Visit Diagnosis: S/P CABG x 2  Patient's Home Medications on Admission:  Current Outpatient Medications:  .  amiodarone (PACERONE) 200 MG tablet, Take 200 mg by mouth daily., Disp: , Rfl:  .  aspirin 81 MG chewable tablet, Chew 81 mg by mouth daily. , Disp: , Rfl:  .  atorvastatin (LIPITOR) 40 MG tablet, Take 40 mg by mouth daily., Disp: , Rfl:  .  clopidogrel (PLAVIX) 75 MG tablet, Take 75 mg by mouth daily., Disp: , Rfl:  .  ELIQUIS 5 MG TABS tablet, Take 5 mg by mouth 2 (two) times daily., Disp: , Rfl:  .  famotidine (PEPCID) 20 MG tablet, Take 20 mg by mouth 2 (two) times daily., Disp: , Rfl:  .  gabapentin (NEURONTIN) 600 MG tablet, Take 300 mg by mouth 3 (three) times daily. , Disp: , Rfl:  .  HUMALOG KWIKPEN 100 UNIT/ML KwikPen, Inject 0-14 Units into the skin 3 (three) times daily with meals., Disp: , Rfl:  .  Insulin Glargine (BASAGLAR KWIKPEN) 100 UNIT/ML SOPN, Inject 26 Units into the skin at bedtime. , Disp: , Rfl:  .  isosorbide mononitrate (IMDUR) 30 MG 24 hr tablet, Take 30 mg by mouth daily., Disp: , Rfl:  .  losartan (COZAAR) 25 MG tablet, Take 25 mg by mouth daily., Disp: , Rfl:  .  MAGNESIUM PO, Take 400 mg by mouth., Disp: , Rfl:  .  Melatonin 5 MG TABS, Take 10 mg by mouth at bedtime. , Disp: , Rfl:  .  metoprolol succinate (TOPROL-XL) 50 MG 24 hr tablet, Take 50 mg by mouth daily., Disp: , Rfl:  .  nitroGLYCERIN (NITROSTAT) 0.4 MG SL tablet, Place 0.4 mg under the tongue every 5 (five) minutes x 3 doses as needed for chest pain., Disp: , Rfl:  .  NOVOLOG FLEXPEN 100 UNIT/ML FlexPen, Inject 0-14  Units into the skin 3 (three) times daily with meals., Disp: , Rfl:  .  oxyCODONE (OXY IR/ROXICODONE) 5 MG immediate release tablet, Take 5 mg by mouth 3 (three) times daily as needed for pain., Disp: , Rfl:  .  sertraline (ZOLOFT) 50 MG tablet, Take 50 mg by mouth daily., Disp: , Rfl:   Past Medical History: Past Medical History:  Diagnosis Date  . Coronary artery disease   . Diabetes mellitus without complication ([place])     Tobacco Use: Social History   Tobacco Use  Smoking Status Former Smoker  Smokeless Tobacco Never Used    Labs: Recent Review Flowsheet Data    There is no flowsheet data to display.       Exercise Target Goals: Exercise Program Goal: Individual exercise prescription set using results from initial 6 min walk test and THRR while considering  patient's activity barriers and safety.   Exercise Prescription Goal: Initial exercise prescription builds to 30-45 minutes a day of aerobic activity, 2-3 days per week.  Home exercise guidelines will be given to patient during program as part of exercise prescription that the participant will acknowledge.  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 HR  111 bpm     Max Ex. BP  136/74     2 Minute Post BP  122/60        Oxygen Initial Assessment:   Oxygen Re-Evaluation:   Oxygen Discharge (Final Oxygen Re-Evaluation):   Initial Exercise Prescription: Initial Exercise Prescription - 11/03/18 1300      Date of Initial Exercise RX and Referring Provider   Date  11/03/18    Referring Provider  Celine Ahr MD      Treadmill   MPH  1.8    Grade  0.5    Minutes  15    METs  2.52      Recumbant Bike   Level  2    RPM  50    Watts  33    Minutes  15    METs  2      REL-XR   Level  1    Speed  50    Minutes  15    METs  2      T5 Nustep   Level  2    SPM  80    Minutes  15    METs  2      Prescription Details   Frequency (times per week)  3    Duration  Progress to 30 minutes of continuous aerobic without signs/symptoms of physical distress      Intensity   THRR 40-80% of Max Heartrate  113-145    Ratings of Perceived Exertion  11-13    Perceived Dyspnea  0-4      Progression   Progression  Continue to progress workloads to maintain intensity without signs/symptoms of physical distress.      Resistance Training   Training Prescription  Yes    Weight  3 lbs    Reps  10-15       Perform Capillary Blood Glucose checks as needed.  Exercise Prescription Changes: Exercise Prescription Changes    Row Name 11/03/18 1300 11/09/18 0900 11/17/18 1100 11/27/18 0800       Response to Exercise   Blood Pressure (Admit)  126/54  100/60  -  124/68  Blood Pressure (Exercise)  136/74  124/60  -  142/60    Blood Pressure (Exit)  122/60  104/60  -  125/56    Heart Rate (Admit)  81 bpm  81 bpm  -  97 bpm    Heart Rate (Exercise)  111 bpm  125 bpm  -  112 bpm    Heart Rate (Exit)  80 bpm  93 bpm  -  79 bpm    Oxygen Saturation (Admit)  99 %  -  -  -    Oxygen Saturation (Exercise)   98 %  -  -  -    Rating of Perceived Exertion (Exercise)  11  14  -  13    Perceived Dyspnea (Exercise)  1  -  -  -    Symptoms  SOB, leg myalasias  leg myalasis  -  none    Comments  walk test results  -  -  -    Duration  -  Continue with 30 min of aerobic exercise without signs/symptoms of physical distress.  -  Continue with 30 min of aerobic exercise without signs/symptoms of physical distress.    Intensity  -  THRR unchanged  -  THRR unchanged      Progression   Progression  -  Continue to progress workloads to maintain intensity without signs/symptoms of physical distress.  -  Continue to progress workloads to maintain intensity without signs/symptoms of physical distress.    Average METs  -  2.54  -  2      Resistance Training   Training Prescription  -  Yes  -  Yes    Weight  -  3 lbs  -  3 lb    Reps  -  10-15  -  10-15      Interval Training   Interval Training  -  No  -  No      Recumbant Bike   Level  -  2  -  -    Watts  -  23  -  -    Minutes  -  15  -  -    METs  -  2.98  -  -      Arm Ergometer   Level  -  -  -  1    Minutes  -  -  -  15    METs  -  -  -  1.9      REL-XR   Level  -  1  -  -    Minutes  -  15  -  -    METs  -  2.1  -  -      Biostep-RELP   Level  -  -  -  1    SPM  -  -  -  50    Minutes  -  -  -  15    METs  -  -  -  2      Home Exercise Plan   Plans to continue exercise at  -  -  Home (comment) walking  Home (comment) walking    Frequency  -  -  Add 2 additional days to program exercise sessions.  Add 2 additional days to program exercise sessions.    Initial Home Exercises Provided  -  -  11/17/18  11/17/18       Exercise Comments: Exercise Comments  [place] Name 11/26/18 1119 12/02/18 1709         Exercise Comments  [name] continues to have pain with walking . Saw PMD yesterday and now has referral to Vascular MD.  Will modify his exercise to more upper body and what he can tolerate lower extremity work.  [name] is doing upper  body resistance training only currently due to severe lower extremity pain. He has an appointment for an ultrasound soon.         Exercise Goals and Review: Exercise Goals    Row Name 11/03/18 1320             Exercise Goals   Increase Physical Activity  Yes       Intervention  Provide advice, education, support and counseling about physical activity/exercise needs.;Develop an individualized exercise prescription for aerobic and resistive training based on initial evaluation findings, risk stratification, comorbidities and participant's personal goals.       Expected Outcomes  Short Term: Attend rehab on a regular basis to increase amount of physical activity.;Long Term: Add in home exercise to make exercise part of routine and to increase amount of physical activity.;Long Term: Exercising regularly at least 3-5 days a week.       Increase Strength and Stamina  Yes       Intervention  Provide advice, education, support and counseling about physical activity/exercise needs.;Develop an individualized exercise prescription for aerobic and resistive training based on initial evaluation findings, risk stratification, comorbidities and participant's personal goals.       Expected Outcomes  Short Term: Increase workloads from initial exercise prescription for resistance, speed, and METs.;Short Term: Perform resistance training exercises routinely during rehab and add in resistance training at home;Long Term: Improve cardiorespiratory fitness, muscular endurance and strength as measured by increased METs and functional capacity (6MWT)       Able to understand and use rate of perceived exertion (RPE) scale  Yes       Intervention  Provide education and explanation on how to use RPE scale       Expected Outcomes  Short Term: Able to use RPE daily in rehab to express subjective intensity level;Long Term:  Able to use RPE to guide intensity level when exercising independently       Able to understand and  use Dyspnea scale  Yes       Intervention  Provide education and explanation on how to use Dyspnea scale       Expected Outcomes  Short Term: Able to use Dyspnea scale daily in rehab to express subjective sense of shortness of breath during exertion;Long Term: Able to use Dyspnea scale to guide intensity level when exercising independently       Knowledge and understanding of Target Heart Rate Range (THRR)  Yes       Intervention  Provide education and explanation of THRR including how the numbers were predicted and where they are located for reference       Expected Outcomes  Short Term: Able to state/look up THRR;Long Term: Able to use THRR to govern intensity when exercising independently;Short Term: Able to use daily as guideline for intensity in rehab       Able to check pulse independently  Yes       Intervention  Provide education and demonstration on how to check pulse in carotid and radial arteries.;Review the importance of being able to check your own pulse for safety during independent exercise  Expected Outcomes  Short Term: Able to explain why pulse checking is important during independent exercise;Long Term: Able to check pulse independently and accurately       Understanding of Exercise Prescription  Yes       Intervention  Provide education, explanation, and written materials on patient's individual exercise prescription       Expected Outcomes  Short Term: Able to explain program exercise prescription;Long Term: Able to explain home exercise prescription to exercise independently          Exercise Goals Re-Evaluation : Exercise Goals Re-Evaluation    Row Name 11/05/18 1115 11/09/18 0914 11/17/18 1101         Exercise Goal Re-Evaluation   Exercise Goals Review  Increase Physical Activity;Able to understand and use rate of perceived exertion (RPE) scale;Knowledge and understanding of Target Heart Rate Range (THRR);Understanding of Exercise Prescription;Increase Strength and  Stamina;Able to check pulse independently  Increase Physical Activity;Increase Strength and Stamina;Understanding of Exercise Prescription  Increase Physical Activity;Increase Strength and Stamina;Understanding of Exercise Prescription;Able to understand and use rate of perceived exertion (RPE) scale;Knowledge and understanding of Target Heart Rate Range (THRR);Able to check pulse independently     Comments  Reviewed RPE scale, THR and program prescription with pt today.  Pt voiced understanding and was given a copy of goals to take home.  [name] is off to a good start in rehab.  He has some mylagias from his statin, but they are getting better and the exercise makes him feel better.  Once his legs recover, he would like to try the elliptical for a station.  We will continue to monitor his progression.  Reviewed home exercise with pt today.  Pt plans to walking at home for exercise.  Reviewed THR, pulse, RPE, sign and symptoms, NTG use, and when to call 911 or MD.  Also discussed weather considerations and indoor options.  Pt voiced understanding.     Expected Outcomes  Short: Use RPE daily to regulate intensity. Long: Follow program prescription in THR.  Short: Continue to attend regularly.  Long; Continue to increase stamina and strength.  Short: Start walking at home on off days.  Long: Continue to increase activity levels        Discharge Exercise Prescription (Final Exercise Prescription Changes): Exercise Prescription Changes - 11/27/18 0800      Response to Exercise   Blood Pressure (Admit)  124/68    Blood Pressure (Exercise)  142/60    Blood Pressure (Exit)  125/56    Heart Rate (Admit)  97 bpm    Heart Rate (Exercise)  112 bpm    Heart Rate (Exit)  79 bpm    Rating of Perceived Exertion (Exercise)  13    Symptoms  none    Duration  Continue with 30 min of aerobic exercise without signs/symptoms of physical distress.    Intensity  THRR unchanged      Progression   Progression  Continue  to progress workloads to maintain intensity without signs/symptoms of physical distress.    Average METs  2      Resistance Training   Training Prescription  Yes    Weight  3 lb    Reps  10-15      Interval Training   Interval Training  No      Arm Ergometer   Level  1    Minutes  15    METs  1.9      Biostep-RELP   Level  1  SPM  50    Minutes  15    METs  2      Home Exercise Plan   Plans to continue exercise at  Home (comment)   walking   Frequency  Add 2 additional days to program exercise sessions.    Initial Home Exercises Provided  11/17/18       Nutrition:  Target Goals: Understanding of nutrition guidelines, daily intake of sodium <1527m, cholesterol <2066m calories 30% from fat and 7% or less from saturated fats, daily to have 5 or more servings of fruits and vegetables.  Biometrics: Pre Biometrics - 11/03/18 1321      Pre Biometrics   Height  5' 9.4" (1.763 m)    Weight  178 lb 9.6 oz (81 kg)    BMI (Calculated)  26.06    Single Leg Stand  1.09 seconds        Nutrition Therapy Plan and Nutrition Goals: Nutrition Therapy & Goals - 11/03/18 1211      Nutrition Therapy   Diet  Low Na, DM, HH diet    Protein (specify units)  70g    Fiber  30 grams    Whole Grain Foods  3 servings    Saturated Fats  12 max. grams    Fruits and Vegetables  5 servings/day    Sodium  1.5 grams      Personal Nutrition Goals   Nutrition Goal  ST: digest information LT: breathe better, gain weight back (200lbs goal, now about 173lbs)    Comments  Pt reports drs telling him to use a sliding slace for his insulin (bases insulin does on BG and not g of CHO eaten). Pt reports B: corn flakes with whole milk or whole wheat toast with eggs and bacon. L: processed cheese, whole wheat bread, ham. D:meat and vegetables (sometimes pasta) -sig other (girlfriend or wife) will make this meal. will sometimes have fried foods about 1x/month pt will drink diet soda and gatorade (half  zero, half regular) with no water. sig other reports he is a very picky eater. Discussed HH eating (more detail than usual as pt reports no knowledge of [facility] prior). Discussed CHO counting, pt reports they tried to have him take a class, but he was unable to. Pt reports losing weight after event and lost 30 lbs in 2 months. 13.5% in 2 months is significant.      Intervention Plan   Intervention  Prescribe, educate and counsel regarding individualized specific dietary modifications aiming towards targeted core components such as weight, hypertension, lipid management, diabetes, heart failure and other comorbidities.;Nutrition handout(s) given to patient.    Expected Outcomes  Short Term Goal: Understand basic principles of dietary content, such as calories, fat, sodium, cholesterol and nutrients.;Short Term Goal: A plan has been developed with personal nutrition goals set during dietitian appointment.;Long Term Goal: Adherence to prescribed nutrition plan.       Nutrition Assessments: Nutrition Assessments - 11/03/18 0723      MEDFICTS Scores   Pre Score  19       Nutrition Goals Re-Evaluation:   Nutrition Goals Discharge (Final Nutrition Goals Re-Evaluation):   Psychosocial: Target Goals: Acknowledge presence or absence of significant depression and/or stress, maximize coping skills, provide positive support system. Participant is able to verbalize types and ability to use techniques and skills needed for reducing stress and depression.   Initial Review & Psychosocial Screening: Initial Psych Review & Screening - 10/30/18 1107  Initial Review   Current issues with  Current Stress Concerns    Source of Stress Concerns  Chronic Illness;Unable to perform yard/household activities;Unable to participate in former interests or hobbies    Comments  Leaf is doing well post CABG. He is still adjusting to learning his limitations after his stroke. He has issues forming his thoughts  into words and some times he feels like his legs are "on backwards." His girlfriend is very supportive and has taken over managing his healthcare.      Family Dynamics   Good Support System?  Yes      Barriers   Psychosocial barriers to participate in program  There are no identifiable barriers or psychosocial needs.      Screening Interventions   Interventions  Encouraged to exercise    Expected Outcomes  Short Term goal: Utilizing psychosocial counselor, staff and physician to assist with identification of specific Stressors or current issues interfering with healing process. Setting desired goal for each stressor or current issue identified.;Long Term Goal: Stressors or current issues are controlled or eliminated.;Short Term goal: Identification and review with participant of any Quality of Life or Depression concerns found by scoring the questionnaire.;Long Term goal: The participant improves quality of Life and PHQ9 Scores as seen by post scores and/or verbalization of changes       Quality of Life Scores:  Quality of Life - 11/03/18 0723      Quality of Life   Select  Quality of Life      Quality of Life Scores   Health/Function Pre  9.6 %    Socioeconomic Pre  14.93 %    Psych/Spiritual Pre  14.35 %    Family Pre  26.4 %    GLOBAL Pre  14.17 %      Scores of 19 and below usually indicate a poorer quality of life in these areas.  A difference of  2-3 points is a clinically meaningful difference.  A difference of 2-3 points in the total score of the Quality of Life Index has been associated with significant improvement in overall quality of life, self-image, physical symptoms, and general health in studies assessing change in quality of life.  PHQ-9: Recent Review Flowsheet Data    Depression screen [facility] 2/9 11/03/2018   Decreased Interest 2   Down, Depressed, Hopeless 1   PHQ - 2 Score 3   Altered sleeping 3   Tired, decreased energy 3   Change in appetite 3   Feeling bad  or failure about yourself  1   Trouble concentrating 3   Moving slowly or fidgety/restless 2   Suicidal thoughts 0   PHQ-9 Score 18   Difficult doing work/chores Extremely dIfficult     Interpretation of Total Score  Total Score Depression Severity:  1-4 = Minimal depression, 5-9 = Mild depression, 10-14 = Moderate depression, 15-19 = Moderately severe depression, 20-27 = Severe depression   Psychosocial Evaluation and Intervention:   Psychosocial Re-Evaluation:   Psychosocial Discharge (Final Psychosocial Re-Evaluation):   Vocational Rehabilitation: Provide vocational rehab assistance to qualifying candidates.   Vocational Rehab Evaluation & Intervention: Vocational Rehab - 10/30/18 1107      Initial Vocational Rehab Evaluation & Intervention   Assessment shows need for Vocational Rehabilitation  No       Education: Education Goals: Education classes will be provided on a variety of topics geared toward better understanding of heart health and risk factor modification. Participant will state understanding/return demonstration of  topics presented as noted by education test scores.  Learning Barriers/Preferences: Learning Barriers/Preferences - 10/30/18 1106      Learning Barriers/Preferences   Learning Barriers  None    Learning Preferences  Individual Instruction       Education Topics:  AED/CPR: - Group verbal and written instruction with the use of models to demonstrate the basic use of the AED with the basic ABC's of resuscitation.   General Nutrition Guidelines/Fats and Fiber: -Group instruction provided by verbal, written material, models and posters to present the general guidelines for heart healthy nutrition. Gives an explanation and review of dietary fats and fiber.   Controlling Sodium/Reading Food Labels: -Group verbal and written material supporting the discussion of sodium use in heart healthy nutrition. Review and explanation with models, verbal  and written materials for utilization of the food label.   Exercise Physiology & General Exercise Guidelines: - Group verbal and written instruction with models to review the exercise physiology of the cardiovascular system and associated critical values. Provides general exercise guidelines with specific guidelines to those with heart or lung disease.    Aerobic Exercise & Resistance Training: - Gives group verbal and written instruction on the various components of exercise. Focuses on aerobic and resistive training programs and the benefits of this training and how to safely progress through these programs..   Flexibility, Balance, Mind/Body Relaxation: Provides group verbal/written instruction on the benefits of flexibility and balance training, including mind/body exercise modes such as yoga, pilates and tai chi.  Demonstration and skill practice provided.   Stress and Anxiety: - Provides group verbal and written instruction about the health risks of elevated stress and causes of high stress.  Discuss the correlation between heart/lung disease and anxiety and treatment options. Review healthy ways to manage with stress and anxiety.   Depression: - Provides group verbal and written instruction on the correlation between heart/lung disease and depressed mood, treatment options, and the stigmas associated with seeking treatment.   Anatomy & Physiology of the Heart: - Group verbal and written instruction and models provide basic cardiac anatomy and physiology, with the coronary electrical and arterial systems. Review of Valvular disease and Heart Failure   Cardiac Procedures: - Group verbal and written instruction to review commonly prescribed medications for heart disease. Reviews the medication, class of the drug, and side effects. Includes the steps to properly store meds and maintain the prescription regimen. (beta blockers and nitrates)   Cardiac Medications I: - Group verbal  and written instruction to review commonly prescribed medications for heart disease. Reviews the medication, class of the drug, and side effects. Includes the steps to properly store meds and maintain the prescription regimen.   Cardiac Medications II: -Group verbal and written instruction to review commonly prescribed medications for heart disease. Reviews the medication, class of the drug, and side effects. (all other drug classes)    Go Sex-Intimacy & Heart Disease, Get SMART - Goal Setting: - Group verbal and written instruction through game format to discuss heart disease and the return to sexual intimacy. Provides group verbal and written material to discuss and apply goal setting through the application of the S.M.A.R.T. Method.   Other Matters of the Heart: - Provides group verbal, written materials and models to describe Stable Angina and Peripheral Artery. Includes description of the disease process and treatment options available to the cardiac patient.   Exercise & Equipment Safety: - Individual verbal instruction and demonstration of equipment use and safety with use of the  equipment.   Cardiac Rehab from 11/03/2018 in [facility] Cardiac and Pulmonary Rehab  Date  11/03/18  Educator  [facility]  Instruction Review Code  1- Verbalizes Understanding      Infection Prevention: - Provides verbal and written material to individual with discussion of infection control including proper hand washing and proper equipment cleaning during exercise session.   Cardiac Rehab from 11/03/2018 in [facility] Cardiac and Pulmonary Rehab  Date  11/03/18  Educator  [facility]  Instruction Review Code  1- Verbalizes Understanding      Falls Prevention: - Provides verbal and written material to individual with discussion of falls prevention and safety.   Cardiac Rehab from 11/03/2018 in [facility] Cardiac and Pulmonary Rehab  Date  11/03/18  Educator  [facility]  Instruction Review Code  1- Verbalizes Understanding       Diabetes: - Individual verbal and written instruction to review signs/symptoms of diabetes, desired ranges of glucose level fasting, after meals and with exercise. Acknowledge that pre and post exercise glucose checks will be done for 3 sessions at entry of program.   Cardiac Rehab from 11/03/2018 in [facility] Cardiac and Pulmonary Rehab  Date  10/30/18  Educator  [facility]  Instruction Review Code  1- Verbalizes Understanding      Know Your Numbers and Risk Factors: -Group verbal and written instruction about important numbers in your health.  Discussion of what are risk factors and how they play a role in the disease process.  Review of Cholesterol, Blood Pressure, Diabetes, and BMI and the role they play in your overall health.   Sleep Hygiene: -Provides group verbal and written instruction about how sleep can affect your health.  Define sleep hygiene, discuss sleep cycles and impact of sleep habits. Review good sleep hygiene tips.    Other: -Provides group and verbal instruction on various topics (see comments)   Knowledge Questionnaire Score: Knowledge Questionnaire Score - 11/03/18 0723      Knowledge Questionnaire Score   Pre Score  24/26 missed Heart Attack, Angina       Core Components/Risk Factors/Patient Goals at Admission: Personal Goals and Risk Factors at Admission - 11/03/18 1321      Core Components/Risk Factors/Patient Goals on Admission    Weight Management  Yes;Weight Gain    Intervention  Weight Management: Develop a combined nutrition and exercise program designed to reach desired caloric intake, while maintaining appropriate intake of nutrient and fiber, sodium and fats, and appropriate energy expenditure required for the weight goal.;Weight Management: Provide education and appropriate resources to help participant work on and attain dietary goals.    Admit Weight  178 lb 9.6 oz (81 kg)    Goal Weight: Short Term  185 lb (83.9 kg)    Expected Outcomes  Short Term:  Continue to assess and modify interventions until short term weight is achieved;Weight Gain: Understanding of general recommendations for a high calorie, high protein meal plan that promotes weight gain by distributing calorie intake throughout the day with the consumption for 4-5 meals, snacks, and/or supplements;Understanding of distribution of calorie intake throughout the day with the consumption of 4-5 meals/snacks;Long Term: Adherence to nutrition and physical activity/exercise program aimed toward attainment of established weight goal    Diabetes  Yes    Intervention  Provide education about signs/symptoms and action to take for hypo/hyperglycemia.;Provide education about proper nutrition, including hydration, and aerobic/resistive exercise prescription along with prescribed medications to achieve blood glucose in normal ranges: Fasting glucose 65-99 mg/dL    Expected  Outcomes  Short Term: Participant verbalizes understanding of the signs/symptoms and immediate care of hyper/hypoglycemia, proper foot care and importance of medication, aerobic/resistive exercise and nutrition plan for blood glucose control.;Long Term: Attainment of HbA1C < 7%.    Hypertension  Yes    Intervention  Provide education on lifestyle modifcations including regular physical activity/exercise, weight management, moderate sodium restriction and increased consumption of fresh fruit, vegetables, and low fat dairy, alcohol moderation, and smoking cessation.;Monitor prescription use compliance.    Expected Outcomes  Short Term: Continued assessment and intervention until BP is < 140/21m HG in hypertensive participants. < 130/8105mHG in hypertensive participants with diabetes, heart failure or chronic kidney disease.;Long Term: Maintenance of blood pressure at goal levels.    Lipids  Yes    Intervention  Provide education and support for participant on nutrition & aerobic/resistive exercise along with prescribed medications to  achieve LDL <703mHDL >80m64m  Expected Outcomes  Short Term: Participant states understanding of desired cholesterol values and is compliant with medications prescribed. Participant is following exercise prescription and nutrition guidelines.;Long Term: Cholesterol controlled with medications as prescribed, with individualized exercise RX and with personalized nutrition plan. Value goals: LDL < 70mg24mL > 40 mg.       Core Components/Risk Factors/Patient Goals Review:    Core Components/Risk Factors/Patient Goals at Discharge (Final Review):    ITP Comments: ITP Comments    Row Name 10/30/18 1038 11/03/18 1313 11/05/18 1114 11/18/18 0549 11/26/18 1117   ITP Comments  Virtual Orientation completed. Diagnosis can be found in CE 5/28. EP/RD orientation scheduled for 7/28 at 11.  Completed 6MWT, gym orientation, and RD evaluation. Initial ITP created and sent for review to Dr. Mark Emily Filbertical Director.  First full day of exercise!  Patient was oriented to gym and equipment including functions, settings, policies, and procedures.  Patient's individual exercise prescription and treatment plan were reviewed.  All starting workloads were established based on the results of the 6 minute walk test done at initial orientation visit.  The plan for exercise progression was also introduced and progression will be customized based on patient's performance and goals.  30 Day Review Completed today. Continue with ITP unless changed by Medical Director review.  RandyLouie Casainues to have pain with walking . Saw PMD yesterday and now has referral to Vascular MD.  Will modify his exercise to more upper body and what he can tolerate lower extremity work.   Row NLake Roesiger 11/27/18 1155 12/07/18 1638 12/15/18 0910 12/16/18 0547     ITP Comments  Still waiting for Vascular MD appt  RandyLouie Casaetting a vascular work up and will be out until cleared to return.  Unable to complete nutrition 30-day re-eval cycle due to  inconsistent attendence  30 Day review. Continue with ITP unless directed changes per Medical Director review.       Comments:

## 2018-12-21 ENCOUNTER — Encounter: Payer: Self-pay | Admitting: *Deleted

## 2018-12-21 DIAGNOSIS — Z951 Presence of aortocoronary bypass graft: Secondary | ICD-10-CM

## 2018-12-21 NOTE — Progress Notes (Signed)
Discharge Progress Report  Patient Details  Name: Bruce Lucas MRN: [id number] Date of Birth: 01-24-1961 Referring Provider:     Cardiac Rehab from 11/03/2018 in [facility] Cardiac and Pulmonary Rehab  Referring Provider  Celine Ahr MD       Number of Visits: 12  Reason for Discharge:  Early Exit:  Personal  Smoking History:  Social History   Tobacco Use  Smoking Status Former Smoker  Smokeless Tobacco Never Used    Diagnosis:  S/P CABG x 2  ADL UCSD:   Initial Exercise Prescription: Initial Exercise Prescription - 11/03/18 1300      Date of Initial Exercise RX and Referring Provider   Date  11/03/18    Referring Provider  Celine Ahr MD      Treadmill   MPH  1.8    Grade  0.5    Minutes  15    METs  2.52      Recumbant Bike   Level  2    RPM  50    Watts  33    Minutes  15    METs  2      REL-XR   Level  1    Speed  50    Minutes  15    METs  2      T5 Nustep   Level  2    SPM  80    Minutes  15    METs  2      Prescription Details   Frequency (times per week)  3    Duration  Progress to 30 minutes of continuous aerobic without signs/symptoms of physical distress      Intensity   THRR 40-80% of Max Heartrate  113-145    Ratings of Perceived Exertion  11-13    Perceived Dyspnea  0-4      Progression   Progression  Continue to progress workloads to maintain intensity without signs/symptoms of physical distress.      Resistance Training   Training Prescription  Yes    Weight  3 lbs    Reps  10-15       Discharge Exercise Prescription (Final Exercise Prescription Changes): Exercise Prescription Changes - 11/27/18 0800      Response to Exercise   Blood Pressure (Admit)  124/68    Blood Pressure (Exercise)  142/60    Blood Pressure (Exit)  125/56    Heart Rate (Admit)  97 bpm    Heart Rate (Exercise)  112 bpm    Heart Rate (Exit)  79 bpm    Rating of Perceived Exertion (Exercise)  13    Symptoms  none    Duration   Continue with 30 min of aerobic exercise without signs/symptoms of physical distress.    Intensity  THRR unchanged      Progression   Progression  Continue to progress workloads to maintain intensity without signs/symptoms of physical distress.    Average METs  2      Resistance Training   Training Prescription  Yes    Weight  3 lb    Reps  10-15      Interval Training   Interval Training  No      Arm Ergometer   Level  1    Minutes  15    METs  1.9      Biostep-RELP   Level  1    SPM  50    Minutes  15  METs  2      Home Exercise Plan   Plans to continue exercise at  Home (comment)   walking   Frequency  Add 2 additional days to program exercise sessions.    Initial Home Exercises Provided  11/17/18       Functional Capacity: 6 Minute Walk    Row Name 11/03/18 1315         6 Minute Walk   Phase  Initial     Distance  1012 feet     Walk Time  5.06 minutes     # of Rest Breaks  2 20 sec, 36 sec     MPH  2.27     METS  3.27     RPE  11     Perceived Dyspnea   1     VO2 Peak  11.46     Symptoms  Yes (comment)     Comments  SOB, leg maylagias     Resting HR  81 bpm     Resting BP  126/54     Resting Oxygen Saturation   99 %     Exercise Oxygen Saturation  during 6 min walk  98 %     Max Ex. HR  111 bpm     Max Ex. BP  136/74     2 Minute Post BP  122/60        Psychological, QOL, Others - Outcomes: PHQ 2/9: Depression screen PHQ 2/9 11/03/2018  Decreased Interest 2  Down, Depressed, Hopeless 1  PHQ - 2 Score 3  Altered sleeping 3  Tired, decreased energy 3  Change in appetite 3  Feeling bad or failure about yourself  1  Trouble concentrating 3  Moving slowly or fidgety/restless 2  Suicidal thoughts 0  PHQ-9 Score 18  Difficult doing work/chores Extremely dIfficult    Quality of Life: Quality of Life - 11/03/18 0723      Quality of Life   Select  Quality of Life      Quality of Life Scores   Health/Function Pre  9.6 %    Socioeconomic  Pre  14.93 %    Psych/Spiritual Pre  14.35 %    Family Pre  26.4 %    GLOBAL Pre  14.17 %       Personal Goals: Goals established at orientation with interventions provided to work toward goal. Personal Goals and Risk Factors at Admission - 11/03/18 1321      Core Components/Risk Factors/Patient Goals on Admission    Weight Management  Yes;Weight Gain    Intervention  Weight Management: Develop a combined nutrition and exercise program designed to reach desired caloric intake, while maintaining appropriate intake of nutrient and fiber, sodium and fats, and appropriate energy expenditure required for the weight goal.;Weight Management: Provide education and appropriate resources to help participant work on and attain dietary goals.    Admit Weight  178 lb 9.6 oz (81 kg)    Goal Weight: Short Term  185 lb (83.9 kg)    Expected Outcomes  Short Term: Continue to assess and modify interventions until short term weight is achieved;Weight Gain: Understanding of general recommendations for a high calorie, high protein meal plan that promotes weight gain by distributing calorie intake throughout the day with the consumption for 4-5 meals, snacks, and/or supplements;Understanding of distribution of calorie intake throughout the day with the consumption of 4-5 meals/snacks;Long Term: Adherence to nutrition and physical activity/exercise program aimed toward attainment of  established weight goal    Diabetes  Yes    Intervention  Provide education about signs/symptoms and action to take for hypo/hyperglycemia.;Provide education about proper nutrition, including hydration, and aerobic/resistive exercise prescription along with prescribed medications to achieve blood glucose in normal ranges: Fasting glucose 65-99 mg/dL    Expected Outcomes  Short Term: Participant verbalizes understanding of the signs/symptoms and immediate care of hyper/hypoglycemia, proper foot care and importance of medication,  aerobic/resistive exercise and nutrition plan for blood glucose control.;Long Term: Attainment of HbA1C < 7%.    Hypertension  Yes    Intervention  Provide education on lifestyle modifcations including regular physical activity/exercise, weight management, moderate sodium restriction and increased consumption of fresh fruit, vegetables, and low fat dairy, alcohol moderation, and smoking cessation.;Monitor prescription use compliance.    Expected Outcomes  Short Term: Continued assessment and intervention until BP is < 140/4mm HG in hypertensive participants. < 130/48mm HG in hypertensive participants with diabetes, heart failure or chronic kidney disease.;Long Term: Maintenance of blood pressure at goal levels.    Lipids  Yes    Intervention  Provide education and support for participant on nutrition & aerobic/resistive exercise along with prescribed medications to achieve LDL 70mg , HDL >40mg .    Expected Outcomes  Short Term: Participant states understanding of desired cholesterol values and is compliant with medications prescribed. Participant is following exercise prescription and nutrition guidelines.;Long Term: Cholesterol controlled with medications as prescribed, with individualized exercise RX and with personalized nutrition plan. Value goals: LDL < 70mg , HDL > 40 mg.        Personal Goals Discharge:   Exercise Goals and Review: Exercise Goals    Row Name 11/03/18 1320             Exercise Goals   Increase Physical Activity  Yes       Intervention  Provide advice, education, support and counseling about physical activity/exercise needs.;Develop an individualized exercise prescription for aerobic and resistive training based on initial evaluation findings, risk stratification, comorbidities and participant's personal goals.       Expected Outcomes  Short Term: Attend rehab on a regular basis to increase amount of physical activity.;Long Term: Add in home exercise to make exercise  part of routine and to increase amount of physical activity.;Long Term: Exercising regularly at least 3-5 days a week.       Increase Strength and Stamina  Yes       Intervention  Provide advice, education, support and counseling about physical activity/exercise needs.;Develop an individualized exercise prescription for aerobic and resistive training based on initial evaluation findings, risk stratification, comorbidities and participant's personal goals.       Expected Outcomes  Short Term: Increase workloads from initial exercise prescription for resistance, speed, and METs.;Short Term: Perform resistance training exercises routinely during rehab and add in resistance training at home;Long Term: Improve cardiorespiratory fitness, muscular endurance and strength as measured by increased METs and functional capacity (6MWT)       Able to understand and use rate of perceived exertion (RPE) scale  Yes       Intervention  Provide education and explanation on how to use RPE scale       Expected Outcomes  Short Term: Able to use RPE daily in rehab to express subjective intensity level;Long Term:  Able to use RPE to guide intensity level when exercising independently       Able to understand and use Dyspnea scale  Yes       Intervention  Provide education and explanation on how to use Dyspnea scale       Expected Outcomes  Short Term: Able to use Dyspnea scale daily in rehab to express subjective sense of shortness of breath during exertion;Long Term: Able to use Dyspnea scale to guide intensity level when exercising independently       Knowledge and understanding of Target Heart Rate Range (THRR)  Yes       Intervention  Provide education and explanation of THRR including how the numbers were predicted and where they are located for reference       Expected Outcomes  Short Term: Able to state/look up THRR;Long Term: Able to use THRR to govern intensity when exercising independently;Short Term: Able to use  daily as guideline for intensity in rehab       Able to check pulse independently  Yes       Intervention  Provide education and demonstration on how to check pulse in carotid and radial arteries.;Review the importance of being able to check your own pulse for safety during independent exercise       Expected Outcomes  Short Term: Able to explain why pulse checking is important during independent exercise;Long Term: Able to check pulse independently and accurately       Understanding of Exercise Prescription  Yes       Intervention  Provide education, explanation, and written materials on patient's individual exercise prescription       Expected Outcomes  Short Term: Able to explain program exercise prescription;Long Term: Able to explain home exercise prescription to exercise independently          Exercise Goals Re-Evaluation: Exercise Goals Re-Evaluation    Row Name 11/05/18 1115 11/09/18 0914 11/17/18 1101         Exercise Goal Re-Evaluation   Exercise Goals Review  Increase Physical Activity;Able to understand and use rate of perceived exertion (RPE) scale;Knowledge and understanding of Target Heart Rate Range (THRR);Understanding of Exercise Prescription;Increase Strength and Stamina;Able to check pulse independently  Increase Physical Activity;Increase Strength and Stamina;Understanding of Exercise Prescription  Increase Physical Activity;Increase Strength and Stamina;Understanding of Exercise Prescription;Able to understand and use rate of perceived exertion (RPE) scale;Knowledge and understanding of Target Heart Rate Range (THRR);Able to check pulse independently     Comments  Reviewed RPE scale, THR and program prescription with pt today.  Pt voiced understanding and was given a copy of goals to take home.  [name] is off to a good start in rehab.  He has some mylagias from his statin, but they are getting better and the exercise makes him feel better.  Once his legs recover, he would like  to try the elliptical for a station.  We will continue to monitor his progression.  Reviewed home exercise with pt today.  Pt plans to walking at home for exercise.  Reviewed THR, pulse, RPE, sign and symptoms, NTG use, and when to call 911 or MD.  Also discussed weather considerations and indoor options.  Pt voiced understanding.     Expected Outcomes  Short: Use RPE daily to regulate intensity. Long: Follow program prescription in THR.  Short: Continue to attend regularly.  Long; Continue to increase stamina and strength.  Short: Start walking at home on off days.  Long: Continue to increase activity levels        Nutrition & Weight - Outcomes: Pre Biometrics - 11/03/18 1321      Pre Biometrics   Height  5' 9.4" (1.763 m)  Weight  178 lb 9.6 oz (81 kg)    BMI (Calculated)  26.06    Single Leg Stand  1.09 seconds        Nutrition: Nutrition Therapy & Goals - 11/03/18 1211      Nutrition Therapy   Diet  Low Na, DM, HH diet    Protein (specify units)  70g    Fiber  30 grams    Whole Grain Foods  3 servings    Saturated Fats  12 max. grams    Fruits and Vegetables  5 servings/day    Sodium  1.5 grams      Personal Nutrition Goals   Nutrition Goal  ST: digest information LT: breathe better, gain weight back (200lbs goal, now about 173lbs)    Comments  Pt reports drs telling him to use a sliding slace for his insulin (bases insulin does on BG and not g of CHO eaten). Pt reports B: corn flakes with whole milk or whole wheat toast with eggs and bacon. L: processed cheese, whole wheat bread, ham. D:meat and vegetables (sometimes pasta) -sig other (girlfriend or wife) will make this meal. will sometimes have fried foods about 1x/month pt will drink diet soda and gatorade (half zero, half regular) with no water. sig other reports he is a very picky eater. Discussed HH eating (more detail than usual as pt reports no knowledge of [place] eating prior). Discussed CHO counting, pt reports they tried  to have him take a class, but he was unable to. Pt reports losing weight after event and lost 30 lbs in 2 months. 13.5% in 2 months is significant.      Intervention Plan   Intervention  Prescribe, educate and counsel regarding individualized specific dietary modifications aiming towards targeted core components such as weight, hypertension, lipid management, diabetes, heart failure and other comorbidities.;Nutrition handout(s) given to patient.    Expected Outcomes  Short Term Goal: Understand basic principles of dietary content, such as calories, fat, sodium, cholesterol and nutrients.;Short Term Goal: A plan has been developed with personal nutrition goals set during dietitian appointment.;Long Term Goal: Adherence to prescribed nutrition plan.       Nutrition Discharge: Nutrition Assessments - 11/03/18 0723      MEDFICTS Scores   Pre Score  19       Education Questionnaire Score: Knowledge Questionnaire Score - 11/03/18 0723      Knowledge Questionnaire Score   Pre Score  24/26 missed Heart Attack, Angina       Goals reviewed with patient; copy given to patient.

## 2018-12-21 NOTE — Progress Notes (Signed)
Cardiac Individual Treatment Plan  Patient Details  Name: Bruce Lucas MRN: [id number] Date of Birth: 15-Dec-1960 Referring Provider:     Cardiac Rehab from 11/03/2018 in [facility] Cardiac and Pulmonary Rehab  Referring Provider  Celine Ahr MD      Initial Encounter Date:    Cardiac Rehab from 11/03/2018 in [facility] Cardiac and Pulmonary Rehab  Date  11/03/18      Visit Diagnosis: S/P CABG x 2  Patient's Home Medications on Admission:  Current Outpatient Medications:  .  amiodarone (PACERONE) 200 MG tablet, Take 200 mg by mouth daily., Disp: , Rfl:  .  aspirin 81 MG chewable tablet, Chew 81 mg by mouth daily. , Disp: , Rfl:  .  atorvastatin (LIPITOR) 40 MG tablet, Take 40 mg by mouth daily., Disp: , Rfl:  .  clopidogrel (PLAVIX) 75 MG tablet, Take 75 mg by mouth daily., Disp: , Rfl:  .  ELIQUIS 5 MG TABS tablet, Take 5 mg by mouth 2 (two) times daily., Disp: , Rfl:  .  famotidine (PEPCID) 20 MG tablet, Take 20 mg by mouth 2 (two) times daily., Disp: , Rfl:  .  gabapentin (NEURONTIN) 600 MG tablet, Take 300 mg by mouth 3 (three) times daily. , Disp: , Rfl:  .  HUMALOG KWIKPEN 100 UNIT/ML KwikPen, Inject 0-14 Units into the skin 3 (three) times daily with meals., Disp: , Rfl:  .  Insulin Glargine (BASAGLAR KWIKPEN) 100 UNIT/ML SOPN, Inject 26 Units into the skin at bedtime. , Disp: , Rfl:  .  isosorbide mononitrate (IMDUR) 30 MG 24 hr tablet, Take 30 mg by mouth daily., Disp: , Rfl:  .  losartan (COZAAR) 25 MG tablet, Take 25 mg by mouth daily., Disp: , Rfl:  .  MAGNESIUM PO, Take 400 mg by mouth., Disp: , Rfl:  .  Melatonin 5 MG TABS, Take 10 mg by mouth at bedtime. , Disp: , Rfl:  .  metoprolol succinate (TOPROL-XL) 50 MG 24 hr tablet, Take 50 mg by mouth daily., Disp: , Rfl:  .  nitroGLYCERIN (NITROSTAT) 0.4 MG SL tablet, Place 0.4 mg under the tongue every 5 (five) minutes x 3 doses as needed for chest pain., Disp: , Rfl:  .  NOVOLOG FLEXPEN 100 UNIT/ML FlexPen, Inject 0-14  Units into the skin 3 (three) times daily with meals., Disp: , Rfl:  .  oxyCODONE (OXY IR/ROXICODONE) 5 MG immediate release tablet, Take 5 mg by mouth 3 (three) times daily as needed for pain., Disp: , Rfl:  .  sertraline (ZOLOFT) 50 MG tablet, Take 50 mg by mouth daily., Disp: , Rfl:   Past Medical History: Past Medical History:  Diagnosis Date  . Coronary artery disease   . Diabetes mellitus without complication ([place])     Tobacco Use: Social History   Tobacco Use  Smoking Status Former Smoker  Smokeless Tobacco Never Used    Labs: Recent Review Flowsheet Data    There is no flowsheet data to display.       Exercise Target Goals: Exercise Program Goal: Individual exercise prescription set using results from initial 6 min walk test and THRR while considering  patient's activity barriers and safety.   Exercise Prescription Goal: Initial exercise prescription builds to 30-45 minutes a day of aerobic activity, 2-3 days per week.  Home exercise guidelines will be given to patient during program as part of exercise prescription that the participant will acknowledge.  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 HR  111 bpm     Max Ex. BP  136/74     2 Minute Post BP  122/60        Oxygen Initial Assessment:   Oxygen Re-Evaluation:   Oxygen Discharge (Final Oxygen Re-Evaluation):   Initial Exercise Prescription: Initial Exercise Prescription - 11/03/18 1300      Date of Initial Exercise RX and Referring Provider   Date  11/03/18    Referring Provider  Celine Ahr MD      Treadmill   MPH  1.8    Grade  0.5    Minutes  15    METs  2.52      Recumbant Bike   Level  2    RPM  50    Watts  33    Minutes  15    METs  2      REL-XR   Level  1    Speed  50    Minutes  15    METs  2      T5 Nustep   Level  2    SPM  80    Minutes  15    METs  2      Prescription Details   Frequency (times per week)  3    Duration  Progress to 30 minutes of continuous aerobic without signs/symptoms of physical distress      Intensity   THRR 40-80% of Max Heartrate  113-145    Ratings of Perceived Exertion  11-13    Perceived Dyspnea  0-4      Progression   Progression  Continue to progress workloads to maintain intensity without signs/symptoms of physical distress.      Resistance Training   Training Prescription  Yes    Weight  3 lbs    Reps  10-15       Perform Capillary Blood Glucose checks as needed.  Exercise Prescription Changes: Exercise Prescription Changes    Row Name 11/03/18 1300 11/09/18 0900 11/17/18 1100 11/27/18 0800       Response to Exercise   Blood Pressure (Admit)  126/54  100/60  -  124/68  Blood Pressure (Exercise)  136/74  124/60  -  142/60    Blood Pressure (Exit)  122/60  104/60  -  125/56    Heart Rate (Admit)  81 bpm  81 bpm  -  97 bpm    Heart Rate (Exercise)  111 bpm  125 bpm  -  112 bpm    Heart Rate (Exit)  80 bpm  93 bpm  -  79 bpm    Oxygen Saturation (Admit)  99 %  -  -  -    Oxygen Saturation (Exercise)   98 %  -  -  -    Rating of Perceived Exertion (Exercise)  11  14  -  13    Perceived Dyspnea (Exercise)  1  -  -  -    Symptoms  SOB, leg myalasias  leg myalasis  -  none    Comments  walk test results  -  -  -    Duration  -  Continue with 30 min of aerobic exercise without signs/symptoms of physical distress.  -  Continue with 30 min of aerobic exercise without signs/symptoms of physical distress.    Intensity  -  THRR unchanged  -  THRR unchanged      Progression   Progression  -  Continue to progress workloads to maintain intensity without signs/symptoms of physical distress.  -  Continue to progress workloads to maintain intensity without signs/symptoms of physical distress.    Average METs  -  2.54  -  2      Resistance Training   Training Prescription  -  Yes  -  Yes    Weight  -  3 lbs  -  3 lb    Reps  -  10-15  -  10-15      Interval Training   Interval Training  -  No  -  No      Recumbant Bike   Level  -  2  -  -    Watts  -  23  -  -    Minutes  -  15  -  -    METs  -  2.98  -  -      Arm Ergometer   Level  -  -  -  1    Minutes  -  -  -  15    METs  -  -  -  1.9      REL-XR   Level  -  1  -  -    Minutes  -  15  -  -    METs  -  2.1  -  -      Biostep-RELP   Level  -  -  -  1    SPM  -  -  -  50    Minutes  -  -  -  15    METs  -  -  -  2      Home Exercise Plan   Plans to continue exercise at  -  -  Home (comment) walking  Home (comment) walking    Frequency  -  -  Add 2 additional days to program exercise sessions.  Add 2 additional days to program exercise sessions.    Initial Home Exercises Provided  -  -  11/17/18  11/17/18       Exercise Comments: Exercise Comments  [place] Name 11/26/18 1119 12/02/18 1709         Exercise Comments  [name] continues to have pain with walking . Saw PMD yesterday and now has referral to Vascular MD.  Will modify his exercise to more upper body and what he can tolerate lower extremity work.  [name] is doing upper  body resistance training only currently due to severe lower extremity pain. He has an appointment for an ultrasound soon.         Exercise Goals and Review: Exercise Goals    Row Name 11/03/18 1320             Exercise Goals   Increase Physical Activity  Yes       Intervention  Provide advice, education, support and counseling about physical activity/exercise needs.;Develop an individualized exercise prescription for aerobic and resistive training based on initial evaluation findings, risk stratification, comorbidities and participant's personal goals.       Expected Outcomes  Short Term: Attend rehab on a regular basis to increase amount of physical activity.;Long Term: Add in home exercise to make exercise part of routine and to increase amount of physical activity.;Long Term: Exercising regularly at least 3-5 days a week.       Increase Strength and Stamina  Yes       Intervention  Provide advice, education, support and counseling about physical activity/exercise needs.;Develop an individualized exercise prescription for aerobic and resistive training based on initial evaluation findings, risk stratification, comorbidities and participant's personal goals.       Expected Outcomes  Short Term: Increase workloads from initial exercise prescription for resistance, speed, and METs.;Short Term: Perform resistance training exercises routinely during rehab and add in resistance training at home;Long Term: Improve cardiorespiratory fitness, muscular endurance and strength as measured by increased METs and functional capacity (6MWT)       Able to understand and use rate of perceived exertion (RPE) scale  Yes       Intervention  Provide education and explanation on how to use RPE scale       Expected Outcomes  Short Term: Able to use RPE daily in rehab to express subjective intensity level;Long Term:  Able to use RPE to guide intensity level when exercising independently       Able to understand and  use Dyspnea scale  Yes       Intervention  Provide education and explanation on how to use Dyspnea scale       Expected Outcomes  Short Term: Able to use Dyspnea scale daily in rehab to express subjective sense of shortness of breath during exertion;Long Term: Able to use Dyspnea scale to guide intensity level when exercising independently       Knowledge and understanding of Target Heart Rate Range (THRR)  Yes       Intervention  Provide education and explanation of THRR including how the numbers were predicted and where they are located for reference       Expected Outcomes  Short Term: Able to state/look up THRR;Long Term: Able to use THRR to govern intensity when exercising independently;Short Term: Able to use daily as guideline for intensity in rehab       Able to check pulse independently  Yes       Intervention  Provide education and demonstration on how to check pulse in carotid and radial arteries.;Review the importance of being able to check your own pulse for safety during independent exercise  Expected Outcomes  Short Term: Able to explain why pulse checking is important during independent exercise;Long Term: Able to check pulse independently and accurately       Understanding of Exercise Prescription  Yes       Intervention  Provide education, explanation, and written materials on patient's individual exercise prescription       Expected Outcomes  Short Term: Able to explain program exercise prescription;Long Term: Able to explain home exercise prescription to exercise independently          Exercise Goals Re-Evaluation : Exercise Goals Re-Evaluation    Row Name 11/05/18 1115 11/09/18 0914 11/17/18 1101         Exercise Goal Re-Evaluation   Exercise Goals Review  Increase Physical Activity;Able to understand and use rate of perceived exertion (RPE) scale;Knowledge and understanding of Target Heart Rate Range (THRR);Understanding of Exercise Prescription;Increase Strength and  Stamina;Able to check pulse independently  Increase Physical Activity;Increase Strength and Stamina;Understanding of Exercise Prescription  Increase Physical Activity;Increase Strength and Stamina;Understanding of Exercise Prescription;Able to understand and use rate of perceived exertion (RPE) scale;Knowledge and understanding of Target Heart Rate Range (THRR);Able to check pulse independently     Comments  Reviewed RPE scale, THR and program prescription with pt today.  Pt voiced understanding and was given a copy of goals to take home.  [name] is off to a good start in rehab.  He has some mylagias from his statin, but they are getting better and the exercise makes him feel better.  Once his legs recover, he would like to try the elliptical for a station.  We will continue to monitor his progression.  Reviewed home exercise with pt today.  Pt plans to walking at home for exercise.  Reviewed THR, pulse, RPE, sign and symptoms, NTG use, and when to call 911 or MD.  Also discussed weather considerations and indoor options.  Pt voiced understanding.     Expected Outcomes  Short: Use RPE daily to regulate intensity. Long: Follow program prescription in THR.  Short: Continue to attend regularly.  Long; Continue to increase stamina and strength.  Short: Start walking at home on off days.  Long: Continue to increase activity levels        Discharge Exercise Prescription (Final Exercise Prescription Changes): Exercise Prescription Changes - 11/27/18 0800      Response to Exercise   Blood Pressure (Admit)  124/68    Blood Pressure (Exercise)  142/60    Blood Pressure (Exit)  125/56    Heart Rate (Admit)  97 bpm    Heart Rate (Exercise)  112 bpm    Heart Rate (Exit)  79 bpm    Rating of Perceived Exertion (Exercise)  13    Symptoms  none    Duration  Continue with 30 min of aerobic exercise without signs/symptoms of physical distress.    Intensity  THRR unchanged      Progression   Progression  Continue  to progress workloads to maintain intensity without signs/symptoms of physical distress.    Average METs  2      Resistance Training   Training Prescription  Yes    Weight  3 lb    Reps  10-15      Interval Training   Interval Training  No      Arm Ergometer   Level  1    Minutes  15    METs  1.9      Biostep-RELP   Level  1  SPM  50    Minutes  15    METs  2      Home Exercise Plan   Plans to continue exercise at  Home (comment)   walking   Frequency  Add 2 additional days to program exercise sessions.    Initial Home Exercises Provided  11/17/18       Nutrition:  Target Goals: Understanding of nutrition guidelines, daily intake of sodium <1527m, cholesterol <2066m calories 30% from fat and 7% or less from saturated fats, daily to have 5 or more servings of fruits and vegetables.  Biometrics: Pre Biometrics - 11/03/18 1321      Pre Biometrics   Height  5' 9.4" (1.763 m)    Weight  178 lb 9.6 oz (81 kg)    BMI (Calculated)  26.06    Single Leg Stand  1.09 seconds        Nutrition Therapy Plan and Nutrition Goals: Nutrition Therapy & Goals - 11/03/18 1211      Nutrition Therapy   Diet  Low Na, DM, HH diet    Protein (specify units)  70g    Fiber  30 grams    Whole Grain Foods  3 servings    Saturated Fats  12 max. grams    Fruits and Vegetables  5 servings/day    Sodium  1.5 grams      Personal Nutrition Goals   Nutrition Goal  ST: digest information LT: breathe better, gain weight back (200lbs goal, now about 173lbs)    Comments  Pt reports drs telling him to use a sliding slace for his insulin (bases insulin does on BG and not g of CHO eaten). Pt reports B: corn flakes with whole milk or whole wheat toast with eggs and bacon. L: processed cheese, whole wheat bread, ham. D:meat and vegetables (sometimes pasta) -sig other (girlfriend or wife) will make this meal. will sometimes have fried foods about 1x/month pt will drink diet soda and gatorade (half  zero, half regular) with no water. sig other reports he is a very picky eater. Discussed HH eating (more detail than usual as pt reports no knowledge of [facility] prior). Discussed CHO counting, pt reports they tried to have him take a class, but he was unable to. Pt reports losing weight after event and lost 30 lbs in 2 months. 13.5% in 2 months is significant.      Intervention Plan   Intervention  Prescribe, educate and counsel regarding individualized specific dietary modifications aiming towards targeted core components such as weight, hypertension, lipid management, diabetes, heart failure and other comorbidities.;Nutrition handout(s) given to patient.    Expected Outcomes  Short Term Goal: Understand basic principles of dietary content, such as calories, fat, sodium, cholesterol and nutrients.;Short Term Goal: A plan has been developed with personal nutrition goals set during dietitian appointment.;Long Term Goal: Adherence to prescribed nutrition plan.       Nutrition Assessments: Nutrition Assessments - 11/03/18 0723      MEDFICTS Scores   Pre Score  19       Nutrition Goals Re-Evaluation:   Nutrition Goals Discharge (Final Nutrition Goals Re-Evaluation):   Psychosocial: Target Goals: Acknowledge presence or absence of significant depression and/or stress, maximize coping skills, provide positive support system. Participant is able to verbalize types and ability to use techniques and skills needed for reducing stress and depression.   Initial Review & Psychosocial Screening: Initial Psych Review & Screening - 10/30/18 1107  Initial Review   Current issues with  Current Stress Concerns    Source of Stress Concerns  Chronic Illness;Unable to perform yard/household activities;Unable to participate in former interests or hobbies    Comments  Leaf is doing well post CABG. He is still adjusting to learning his limitations after his stroke. He has issues forming his thoughts  into words and some times he feels like his legs are "on backwards." His girlfriend is very supportive and has taken over managing his healthcare.      Family Dynamics   Good Support System?  Yes      Barriers   Psychosocial barriers to participate in program  There are no identifiable barriers or psychosocial needs.      Screening Interventions   Interventions  Encouraged to exercise    Expected Outcomes  Short Term goal: Utilizing psychosocial counselor, staff and physician to assist with identification of specific Stressors or current issues interfering with healing process. Setting desired goal for each stressor or current issue identified.;Long Term Goal: Stressors or current issues are controlled or eliminated.;Short Term goal: Identification and review with participant of any Quality of Life or Depression concerns found by scoring the questionnaire.;Long Term goal: The participant improves quality of Life and PHQ9 Scores as seen by post scores and/or verbalization of changes       Quality of Life Scores:  Quality of Life - 11/03/18 0723      Quality of Life   Select  Quality of Life      Quality of Life Scores   Health/Function Pre  9.6 %    Socioeconomic Pre  14.93 %    Psych/Spiritual Pre  14.35 %    Family Pre  26.4 %    GLOBAL Pre  14.17 %      Scores of 19 and below usually indicate a poorer quality of life in these areas.  A difference of  2-3 points is a clinically meaningful difference.  A difference of 2-3 points in the total score of the Quality of Life Index has been associated with significant improvement in overall quality of life, self-image, physical symptoms, and general health in studies assessing change in quality of life.  PHQ-9: Recent Review Flowsheet Data    Depression screen [facility] 2/9 11/03/2018   Decreased Interest 2   Down, Depressed, Hopeless 1   PHQ - 2 Score 3   Altered sleeping 3   Tired, decreased energy 3   Change in appetite 3   Feeling bad  or failure about yourself  1   Trouble concentrating 3   Moving slowly or fidgety/restless 2   Suicidal thoughts 0   PHQ-9 Score 18   Difficult doing work/chores Extremely dIfficult     Interpretation of Total Score  Total Score Depression Severity:  1-4 = Minimal depression, 5-9 = Mild depression, 10-14 = Moderate depression, 15-19 = Moderately severe depression, 20-27 = Severe depression   Psychosocial Evaluation and Intervention:   Psychosocial Re-Evaluation:   Psychosocial Discharge (Final Psychosocial Re-Evaluation):   Vocational Rehabilitation: Provide vocational rehab assistance to qualifying candidates.   Vocational Rehab Evaluation & Intervention: Vocational Rehab - 10/30/18 1107      Initial Vocational Rehab Evaluation & Intervention   Assessment shows need for Vocational Rehabilitation  No       Education: Education Goals: Education classes will be provided on a variety of topics geared toward better understanding of heart health and risk factor modification. Participant will state understanding/return demonstration of  topics presented as noted by education test scores.  Learning Barriers/Preferences: Learning Barriers/Preferences - 10/30/18 1106      Learning Barriers/Preferences   Learning Barriers  None    Learning Preferences  Individual Instruction       Education Topics:  AED/CPR: - Group verbal and written instruction with the use of models to demonstrate the basic use of the AED with the basic ABC's of resuscitation.   General Nutrition Guidelines/Fats and Fiber: -Group instruction provided by verbal, written material, models and posters to present the general guidelines for heart healthy nutrition. Gives an explanation and review of dietary fats and fiber.   Controlling Sodium/Reading Food Labels: -Group verbal and written material supporting the discussion of sodium use in heart healthy nutrition. Review and explanation with models, verbal  and written materials for utilization of the food label.   Exercise Physiology & General Exercise Guidelines: - Group verbal and written instruction with models to review the exercise physiology of the cardiovascular system and associated critical values. Provides general exercise guidelines with specific guidelines to those with heart or lung disease.    Aerobic Exercise & Resistance Training: - Gives group verbal and written instruction on the various components of exercise. Focuses on aerobic and resistive training programs and the benefits of this training and how to safely progress through these programs..   Flexibility, Balance, Mind/Body Relaxation: Provides group verbal/written instruction on the benefits of flexibility and balance training, including mind/body exercise modes such as yoga, pilates and tai chi.  Demonstration and skill practice provided.   Stress and Anxiety: - Provides group verbal and written instruction about the health risks of elevated stress and causes of high stress.  Discuss the correlation between heart/lung disease and anxiety and treatment options. Review healthy ways to manage with stress and anxiety.   Depression: - Provides group verbal and written instruction on the correlation between heart/lung disease and depressed mood, treatment options, and the stigmas associated with seeking treatment.   Anatomy & Physiology of the Heart: - Group verbal and written instruction and models provide basic cardiac anatomy and physiology, with the coronary electrical and arterial systems. Review of Valvular disease and Heart Failure   Cardiac Procedures: - Group verbal and written instruction to review commonly prescribed medications for heart disease. Reviews the medication, class of the drug, and side effects. Includes the steps to properly store meds and maintain the prescription regimen. (beta blockers and nitrates)   Cardiac Medications I: - Group verbal  and written instruction to review commonly prescribed medications for heart disease. Reviews the medication, class of the drug, and side effects. Includes the steps to properly store meds and maintain the prescription regimen.   Cardiac Medications II: -Group verbal and written instruction to review commonly prescribed medications for heart disease. Reviews the medication, class of the drug, and side effects. (all other drug classes)    Go Sex-Intimacy & Heart Disease, Get SMART - Goal Setting: - Group verbal and written instruction through game format to discuss heart disease and the return to sexual intimacy. Provides group verbal and written material to discuss and apply goal setting through the application of the S.M.A.R.T. Method.   Other Matters of the Heart: - Provides group verbal, written materials and models to describe Stable Angina and Peripheral Artery. Includes description of the disease process and treatment options available to the cardiac patient.   Exercise & Equipment Safety: - Individual verbal instruction and demonstration of equipment use and safety with use of the  equipment.   Cardiac Rehab from 11/03/2018 in [facility] Cardiac and Pulmonary Rehab  Date  11/03/18  Educator  [facility]  Instruction Review Code  1- Verbalizes Understanding      Infection Prevention: - Provides verbal and written material to individual with discussion of infection control including proper hand washing and proper equipment cleaning during exercise session.   Cardiac Rehab from 11/03/2018 in [facility] Cardiac and Pulmonary Rehab  Date  11/03/18  Educator  [facility]  Instruction Review Code  1- Verbalizes Understanding      Falls Prevention: - Provides verbal and written material to individual with discussion of falls prevention and safety.   Cardiac Rehab from 11/03/2018 in [facility] Cardiac and Pulmonary Rehab  Date  11/03/18  Educator  [facility]  Instruction Review Code  1- Verbalizes Understanding       Diabetes: - Individual verbal and written instruction to review signs/symptoms of diabetes, desired ranges of glucose level fasting, after meals and with exercise. Acknowledge that pre and post exercise glucose checks will be done for 3 sessions at entry of program.   Cardiac Rehab from 11/03/2018 in [facility] Cardiac and Pulmonary Rehab  Date  10/30/18  Educator  [facility]  Instruction Review Code  1- Verbalizes Understanding      Know Your Numbers and Risk Factors: -Group verbal and written instruction about important numbers in your health.  Discussion of what are risk factors and how they play a role in the disease process.  Review of Cholesterol, Blood Pressure, Diabetes, and BMI and the role they play in your overall health.   Sleep Hygiene: -Provides group verbal and written instruction about how sleep can affect your health.  Define sleep hygiene, discuss sleep cycles and impact of sleep habits. Review good sleep hygiene tips.    Other: -Provides group and verbal instruction on various topics (see comments)   Knowledge Questionnaire Score: Knowledge Questionnaire Score - 11/03/18 0723      Knowledge Questionnaire Score   Pre Score  24/26 missed Heart Attack, Angina       Core Components/Risk Factors/Patient Goals at Admission: Personal Goals and Risk Factors at Admission - 11/03/18 1321      Core Components/Risk Factors/Patient Goals on Admission    Weight Management  Yes;Weight Gain    Intervention  Weight Management: Develop a combined nutrition and exercise program designed to reach desired caloric intake, while maintaining appropriate intake of nutrient and fiber, sodium and fats, and appropriate energy expenditure required for the weight goal.;Weight Management: Provide education and appropriate resources to help participant work on and attain dietary goals.    Admit Weight  178 lb 9.6 oz (81 kg)    Goal Weight: Short Term  185 lb (83.9 kg)    Expected Outcomes  Short Term:  Continue to assess and modify interventions until short term weight is achieved;Weight Gain: Understanding of general recommendations for a high calorie, high protein meal plan that promotes weight gain by distributing calorie intake throughout the day with the consumption for 4-5 meals, snacks, and/or supplements;Understanding of distribution of calorie intake throughout the day with the consumption of 4-5 meals/snacks;Long Term: Adherence to nutrition and physical activity/exercise program aimed toward attainment of established weight goal    Diabetes  Yes    Intervention  Provide education about signs/symptoms and action to take for hypo/hyperglycemia.;Provide education about proper nutrition, including hydration, and aerobic/resistive exercise prescription along with prescribed medications to achieve blood glucose in normal ranges: Fasting glucose 65-99 mg/dL    Expected  Outcomes  Short Term: Participant verbalizes understanding of the signs/symptoms and immediate care of hyper/hypoglycemia, proper foot care and importance of medication, aerobic/resistive exercise and nutrition plan for blood glucose control.;Long Term: Attainment of HbA1C < 7%.    Hypertension  Yes    Intervention  Provide education on lifestyle modifcations including regular physical activity/exercise, weight management, moderate sodium restriction and increased consumption of fresh fruit, vegetables, and low fat dairy, alcohol moderation, and smoking cessation.;Monitor prescription use compliance.    Expected Outcomes  Short Term: Continued assessment and intervention until BP is < 140/13m HG in hypertensive participants. < 130/841mHG in hypertensive participants with diabetes, heart failure or chronic kidney disease.;Long Term: Maintenance of blood pressure at goal levels.    Lipids  Yes    Intervention  Provide education and support for participant on nutrition & aerobic/resistive exercise along with prescribed medications to  achieve LDL '70mg'$ , HDL >'40mg'$ .    Expected Outcomes  Short Term: Participant states understanding of desired cholesterol values and is compliant with medications prescribed. Participant is following exercise prescription and nutrition guidelines.;Long Term: Cholesterol controlled with medications as prescribed, with individualized exercise RX and with personalized nutrition plan. Value goals: LDL < '70mg'$ , HDL > 40 mg.       Core Components/Risk Factors/Patient Goals Review:    Core Components/Risk Factors/Patient Goals at Discharge (Final Review):    ITP Comments: ITP Comments    Row Name 10/30/18 1038 11/03/18 1313 11/05/18 1114 11/18/18 0549 11/26/18 1117   ITP Comments  Virtual Orientation completed. Diagnosis can be found in CE 5/28. EP/RD orientation scheduled for 7/28 at 11.  Completed 6MWT, gym orientation, and RD evaluation. Initial ITP created and sent for review to Dr. MaEmily FilbertMedical Director.  First full day of exercise!  Patient was oriented to gym and equipment including functions, settings, policies, and procedures.  Patient's individual exercise prescription and treatment plan were reviewed.  All starting workloads were established based on the results of the 6 minute walk test done at initial orientation visit.  The plan for exercise progression was also introduced and progression will be customized based on patient's performance and goals.  30 Day Review Completed today. Continue with ITP unless changed by Medical Director review.  RaLouie Casaontinues to have pain with walking . Saw PMD yesterday and now has referral to Vascular MD.  Will modify his exercise to more upper body and what he can tolerate lower extremity work.   [place] 11/27/18 1155 12/07/18 1638 12/15/18 0910 12/16/18 0547 12/21/18 1322   ITP Comments  Still waiting for Vascular MD appt  RaLouie Casas getting a vascular work up and will be out until cleared to return.  Unable to complete nutrition 30-day re-eval cycle due  to inconsistent attendence  30 Day review. Continue with ITP unless directed changes per Medical Director review.  Pt returned call on Friday and would like to discharge at this time. Discharge ITP and summary sent for review.      Comments: Discharge ITP

## 2019-03-03 ENCOUNTER — Ambulatory Visit
Admission: RE | Admit: 2019-03-03 | Discharge: 2019-03-03 | Disposition: A | Discharge status: Home / Self Care | Payer: 59 | Source: Ambulatory Visit | Attending: Internal Medicine | Admitting: Internal Medicine

## 2019-03-03 ENCOUNTER — Other Ambulatory Visit: Payer: Self-pay | Admitting: Internal Medicine

## 2019-03-03 ENCOUNTER — Other Ambulatory Visit: Payer: Self-pay

## 2019-03-03 DIAGNOSIS — R600 Localized edema: Secondary | ICD-10-CM

## 2019-04-12 DIAGNOSIS — T466X5A Adverse effect of antihyperlipidemic and antiarteriosclerotic drugs, initial encounter: Secondary | ICD-10-CM | POA: Insufficient documentation

## 2019-05-14 ENCOUNTER — Ambulatory Visit: Payer: Self-pay | Admitting: Pharmacy Technician

## 2019-05-14 ENCOUNTER — Other Ambulatory Visit: Payer: Self-pay

## 2019-05-14 DIAGNOSIS — Z79899 Other long term (current) drug therapy: Secondary | ICD-10-CM

## 2019-05-14 NOTE — Progress Notes (Signed)
Completed [facility] application and contract.  Patient agreed to all terms of the [facility] contract.    Patient approved to receive medication assistance at [facility] until patient needs to re-certify in 7371, and as long as eligibility criteria continues to be met.    Provided patient with Civil engineer, contracting based on his particular needs.    Referred patient for MTM.  [facility]

## 2019-05-24 ENCOUNTER — Encounter: Payer: Self-pay | Admitting: Pharmacist

## 2019-05-24 ENCOUNTER — Ambulatory Visit: Payer: Self-pay | Admitting: Pharmacist

## 2019-05-24 ENCOUNTER — Other Ambulatory Visit: Payer: Self-pay

## 2019-05-24 DIAGNOSIS — Z79899 Other long term (current) drug therapy: Secondary | ICD-10-CM

## 2019-05-24 NOTE — Progress Notes (Signed)
[facility] Visit Note  Patient: Bruce Lucas MRN: [id number] Date of Birth: Nov 18, 1960 PCP: Kirk Ruths, MD   Bruce Lucas 59 y.o. male presents for an [employer] visit today with the pharmacist. He was identified by name and date of birth. [facility] pharmacy will be assisting with medication access. His friend was also present and assisted with answering the questions. Past medical history per patient report and notes in Epic.  There were no vitals taken for this visit.  Patient Information   Past Medical History:  Diagnosis Date  . Coronary artery disease   . Diabetes mellitus (HCC)    Type 1  . Diabetic peripheral neuropathy ([place])   . Diabetic retinopathy ([place])   . Hyperlipidemia   . Hypertension   . Stroke [facility])    06/2018      Past Surgical History:  Procedure Laterality Date  . CARDIAC SURGERY    . CORONARY ARTERY BYPASS GRAFT     09/03/18  . EYE SURGERY     Per patient, for diabetic retinopathy, right eye  . KNEE SURGERY Bilateral   . TONSILLECTOMY       Family History  Problem Relation Age of Onset  . Brain cancer Mother   . Hypertension Father     New Diagnoses (since last visit):   Family Support: Good   Exercise: No longer able to exercise post stroke in March of 2020. Previously walking.        Social History   Substance and Sexual Activity  Alcohol Use No      Social History   Tobacco Use  Smoking Status Former Smoker  Smokeless Tobacco Never Used      Health Maintenance  Topic Date Due  . HEMOGLOBIN A1C  Feb 17, 1959  . Hepatitis C Screening  June 21, 1960  . FOOT EXAM  01/02/1969  . OPHTHALMOLOGY EXAM  01/02/1969  . URINE MICROALBUMIN  01/02/1969  . HIV Screening  01/02/1974  . COLONOSCOPY  01/02/2009  . INFLUENZA VACCINE  11/07/2018  . TETANUS/TDAP  03/06/2022  . PNEUMOCOCCAL POLYSACCHARIDE VACCINE AGE 37-64 HIGH RISK  Completed    Outpatient Encounter  Medications as of 05/24/2019  Medication Sig  . aspirin 81 MG chewable tablet Chew 81 mg by mouth daily.   Marland Kitchen gabapentin (NEURONTIN) 600 MG tablet Take 300 mg by mouth 3 (three) times daily. Takes 300mg  twice daily  . HUMALOG KWIKPEN 100 UNIT/ML KwikPen Inject 0-14 Units into the skin 3 (three) times daily with meals.  . Insulin Glargine (LANTUS SOLOSTAR) 100 UNIT/ML Solostar Pen Inject 24 Units into the skin at bedtime.  . Melatonin 5 MG TABS Take 10 mg by mouth at bedtime.   . metoprolol succinate (TOPROL-XL) 50 MG 24 hr tablet Take 50 mg by mouth daily.  . nitroGLYCERIN (NITROSTAT) 0.4 MG SL tablet Place 0.4 mg under the tongue every 5 (five) minutes x 3 doses as needed for chest pain.  Marland Kitchen sertraline (ZOLOFT) 50 MG tablet Take 50 mg by mouth daily.  . traZODone (DESYREL) 50 MG tablet Take 50 mg by mouth at bedtime. 50-100 mg at bedtime  . Insulin Glargine (BASAGLAR KWIKPEN) 100 UNIT/ML SOPN Inject 26 Units into the skin at bedtime. Will use to bridge IF Lantus not available  . [DISCONTINUED] amiodarone (PACERONE) 200 MG tablet Take 200 mg by mouth daily.  . [DISCONTINUED] atorvastatin (LIPITOR) 40 MG tablet Take 40 mg by mouth daily.  . [DISCONTINUED] clopidogrel (PLAVIX) 75 MG tablet Take  75 mg by mouth daily.  . [DISCONTINUED] ELIQUIS 5 MG TABS tablet Take 5 mg by mouth 2 (two) times daily.  . [DISCONTINUED] famotidine (PEPCID) 20 MG tablet Take 20 mg by mouth 2 (two) times daily.  . [DISCONTINUED] isosorbide mononitrate (IMDUR) 30 MG 24 hr tablet Take 30 mg by mouth daily.  . [DISCONTINUED] losartan (COZAAR) 25 MG tablet Take 25 mg by mouth daily.  . [DISCONTINUED] MAGNESIUM PO Take 400 mg by mouth.  . [DISCONTINUED] NOVOLOG FLEXPEN 100 UNIT/ML FlexPen Inject 0-14 Units into the skin 3 (three) times daily with meals.  . [DISCONTINUED] oxyCODONE (OXY IR/ROXICODONE) 5 MG immediate release tablet Take 5 mg by mouth 3 (three) times daily as needed for pain.   No facility-administered encounter  medications on file as of 05/24/2019.    Health Maintenance/Date Completed  Last ED visit: 2020 fluid in lungs post heart surgery Last Visit to PCP: 04/2019 Next Visit to PCP: 09/2019 Specialist Visit: n/a Dental Exam: ? Eye Exam: 04/2019 Prostate Exam: none Colonoscopy: none Flu Vaccine: not able to assess  Pneumonia Vaccine: ? 10/2018 COVID-19 Vaccine: not able to assess Shingrix Vaccine: not able to assess  Assessment and Plan:  Adherence: -Takes medications as prescribed. Uses a pill box. Patient was able to confirm medications names, directions, dosages and frequency. Diabetes: -Diabetic retinopathy, diabetic peripheral neuropathy -Blood glucose readings vary significantly (31-490); checking 3-4 times daily -Novolog sliding scale (0-14 units) before meals and Lantus 24 units at bedtime -SCr = 0.7; BUN = 17 (04/05/19); Ac1 = 8.4% (12/01/18) CAD s/p CABG 09/03/18; NSTEMI 06/15/2018: Stroke 06/13/2018 Hypertension: -not assessed at this visit -Currently taking metoprolol ER Hyperlipidemia: -atorvastatin discontinued due to leg pain -TC = 122 mg/dl; TG = 76 mg/dl; HDL = 34.6 mg/dl; LDL = 72 mg/dl (04/05/19)   PLAN: Follow up MTM Outreach call scheduled for 08/23/19 to further review past medical history. Patient will pick up Bionime meter, test strips and lancets from [facility]. Pharmacist to refer patient to the [facility] [place] for assistance with charity care applications.  Bruce Lucas K. Dicky Doe, PharmD [facility] [place] Operations Coordinator [phone]

## 2019-05-25 ENCOUNTER — Telehealth: Payer: Self-pay | Admitting: Pharmacy Technician

## 2019-05-25 NOTE — Telephone Encounter (Signed)
Patient stated that he had outstanding bills from [facility] and [facility] that had been turned over to collection.  Provided patient with phone number for [facility].  Patient stated that he would call to discuss.  Also, explained that I wasn't sure of the contact information for [facility].  Suggested that patient look on billing statement for [facility] for phone number to discuss charity care and arranging a payment plan.  [place] [facility]

## 2019-06-07 ENCOUNTER — Ambulatory Visit (INDEPENDENT_AMBULATORY_CARE_PROVIDER_SITE_OTHER): Payer: 59 | Admitting: Vascular Surgery

## 2019-06-08 ENCOUNTER — Ambulatory Visit (INDEPENDENT_AMBULATORY_CARE_PROVIDER_SITE_OTHER): Payer: 59 | Admitting: Vascular Surgery

## 2019-07-02 ENCOUNTER — Telehealth (INDEPENDENT_AMBULATORY_CARE_PROVIDER_SITE_OTHER): Payer: Self-pay | Admitting: Vascular Surgery

## 2019-07-02 NOTE — Telephone Encounter (Signed)
She would like to know if the appt necessary. She would like to know GS opinion on whether or not anything has changed as for as the patients need for stents. She states that the DX is for edema and if nothing has changed she is not sure if it is even necessary for patient to come in if that is going to be the same care plan. She states the patients most recent ultrasound was in November of this year. She states that the patients insurance does not start until April. They dont really want to come in if its going to be the same result. Please advise.

## 2019-07-02 NOTE — Telephone Encounter (Signed)
Please advise 

## 2019-07-05 NOTE — Telephone Encounter (Signed)
I spoke with Dr. Delana Meyer directly and based on the last time he saw the patient we didn't discuss edema it was more so focused on his arterial disease.  The patient's ultrasound in November looked for blood clots but it didn't look at the function of the veins.  What is also slightly confusing is that the referral mentions cyanosis of the legs so we aren't sure if that is related to something arterial or venous.  Per Dr. Delana Meyer we don't need an ultrasound if he doesn't have insurance but in order for him to know if something has changed in reference to his needs for stents he will need to see the patient to be able to evaluate him.

## 2019-07-05 NOTE — Telephone Encounter (Signed)
Attempted to call patient ; vm is full.

## 2019-07-05 NOTE — Telephone Encounter (Signed)
Please call the pt to see if they want to be seen based on the info below per Dr. Delana Meyer and NP Arna Medici. Thanks.

## 2019-07-15 ENCOUNTER — Ambulatory Visit (INDEPENDENT_AMBULATORY_CARE_PROVIDER_SITE_OTHER): Payer: 59 | Admitting: Vascular Surgery

## 2019-07-15 ENCOUNTER — Encounter (INDEPENDENT_AMBULATORY_CARE_PROVIDER_SITE_OTHER): Payer: Self-pay | Admitting: Vascular Surgery

## 2019-07-15 ENCOUNTER — Other Ambulatory Visit: Payer: Self-pay

## 2019-07-15 VITALS — BP 123/69 | HR 61 | Resp 16 | Ht 65.0 in | Wt 201.0 lb

## 2019-07-15 DIAGNOSIS — I872 Venous insufficiency (chronic) (peripheral): Secondary | ICD-10-CM | POA: Insufficient documentation

## 2019-07-15 DIAGNOSIS — I2510 Atherosclerotic heart disease of native coronary artery without angina pectoris: Secondary | ICD-10-CM | POA: Diagnosis not present

## 2019-07-15 DIAGNOSIS — I70213 Atherosclerosis of native arteries of extremities with intermittent claudication, bilateral legs: Secondary | ICD-10-CM | POA: Diagnosis not present

## 2019-07-15 DIAGNOSIS — Z794 Long term (current) use of insulin: Secondary | ICD-10-CM

## 2019-07-15 DIAGNOSIS — I10 Essential (primary) hypertension: Secondary | ICD-10-CM | POA: Diagnosis not present

## 2019-07-15 DIAGNOSIS — E1159 Type 2 diabetes mellitus with other circulatory complications: Secondary | ICD-10-CM

## 2019-07-15 NOTE — Progress Notes (Signed)
MRN : [id number]  Bruce Lucas is a 59 y.o. (18-Aug-1960) male who presents with chief complaint of  Chief Complaint  Patient presents with  . Follow-up    est pt Bruce Lucas cyanosis   .  History of Present Illness:  The patient returns to the office for followup evaluation regarding right foot swelling.  The swelling has persisted but he denies pain. He notes he has been increasing his walking and wonders whether this could be part of the problem.  There have not been any interval development of a ulcerations or wounds.  Since the previous visit the patient has been wearing graduated compression stockings, Tommy Copper as well as TED hose and has noted little if any improvement in the lymphedema. The patient has been using compression routinely morning until night.  The patient also states elevation during the day and exercise is being done too.    Current Meds  Medication Sig  . gabapentin (NEURONTIN) 600 MG tablet Take 300 mg by mouth 3 (three) times daily. Takes 300mg  twice daily  . HUMALOG KWIKPEN 100 UNIT/ML KwikPen Inject 0-14 Units into the skin 3 (three) times daily with meals.  . Insulin Glargine (BASAGLAR KWIKPEN) 100 UNIT/ML SOPN Inject 26 Units into the skin at bedtime. Will use to bridge IF Lantus not available  . Insulin Glargine (LANTUS SOLOSTAR) 100 UNIT/ML Solostar Pen Inject 24 Units into the skin at bedtime.  . Melatonin 5 MG TABS Take 10 mg by mouth at bedtime.   . sertraline (ZOLOFT) 50 MG tablet Take 50 mg by mouth daily.  . traZODone (DESYREL) 50 MG tablet Take 50 mg by mouth at bedtime. 50-100 mg at bedtime    Past Medical History:  Diagnosis Date  . Coronary artery disease   . Diabetes mellitus (HCC)    Type 1  . Diabetic peripheral neuropathy ([place])   . Diabetic retinopathy ([place])   . Hyperlipidemia   . Hypertension   . Stroke [facility])    06/2018    Past Surgical History:  Procedure Laterality Date  . CARDIAC SURGERY    . CORONARY ARTERY BYPASS  GRAFT     09/03/18  . EYE SURGERY     Per patient, for diabetic retinopathy, right eye  . KNEE SURGERY Bilateral   . TONSILLECTOMY      Social History Social History   Tobacco Use  . Smoking status: Former Research scientist (life sciences)  . Smokeless tobacco: Never Used  Substance Use Topics  . Alcohol use: No  . Drug use: No    Family History Family History  Problem Relation Age of Onset  . Brain cancer Mother   . Hypertension Father     Allergies  Allergen Reactions  . Codeine   . Penicillins      REVIEW OF SYSTEMS (Negative unless checked)  Constitutional: [] Weight loss  [] Fever  [] Chills Cardiac: [] Chest pain   [] Chest pressure   [] Palpitations   [] Shortness of breath when laying flat   [] Shortness of breath with exertion. Vascular:  [] Pain in legs with walking   [] Pain in legs at rest  [] History of DVT   [] Phlebitis   [x] Swelling in legs   [] Varicose veins   [] Non-healing ulcers Pulmonary:   [] Uses home oxygen   [] Productive cough   [] Hemoptysis   [] Wheeze  [] COPD   [] Asthma Neurologic:  [] Dizziness   [] Seizures   [] History of stroke   [] History of TIA  [] Aphasia   [] Vissual changes   [] Weakness or numbness in  arm   [] Weakness or numbness in leg Musculoskeletal:   [] Joint swelling   [] Joint pain   [] Low back pain Hematologic:  [] Easy bruising  [] Easy bleeding   [] Hypercoagulable state   [] Anemic Gastrointestinal:  [] Diarrhea   [] Vomiting  [] Gastroesophageal reflux/heartburn   [] Difficulty swallowing. Genitourinary:  [] Chronic kidney disease   [] Difficult urination  [] Frequent urination   [] Blood in urine Skin:  [] Rashes   [] Ulcers  Psychological:  [] History of anxiety   []  History of major depression.  Physical Examination  Vitals:   07/15/19 1143  BP: 123/69  Pulse: 61  Resp: 16  Weight: 201 lb (91.2 kg)  Height: 5\' 5"  (1.651 m)   Body mass index is 33.45 kg/m. Gen: WD/WN, NAD Head: [place]/AT, No temporalis wasting.  Ear/Nose/Throat: Hearing grossly intact, nares w/o erythema or  drainage Eyes: PER, EOMI, sclera nonicteric.  Neck: Supple, no large masses.   Pulmonary:  Good air movement, no audible wheezing bilaterally, no use of accessory muscles.  Cardiac: RRR, no JVD Vascular: scattered varicosities present bilaterally.  Mild venous stasis changes to the legs bilaterally.  2+ soft pitting edema of the right foot and ankle. Vessel Right Left  Radial Palpable Palpable  PT Palpable   DP Not Palpable   Gastrointestinal: Non-distended. No guarding/no peritoneal signs.  Musculoskeletal: M/S 5/5 throughout.  No deformity or atrophy.  Neurologic: CN 2-12 intact. Symmetrical.  Speech is fluent. Motor exam as listed above. Psychiatric: Judgment intact, Mood & affect appropriate for pt's clinical situation. Dermatologic: No rashes or ulcers noted.  No changes consistent with cellulitis. Lymph : No lichenification or skin changes of chronic lymphedema.  CBC Lab Results  Component Value Date   WBC 14.9 (H) 09/13/2018   HGB 11.1 (L) 09/13/2018   HCT 33.5 (L) 09/13/2018   MCV 85.5 09/13/2018   PLT 582 (H) 09/13/2018    BMET    Component Value Date/Time   NA 135 09/13/2018 1407   K 4.4 09/13/2018 1407   CL 102 09/13/2018 1407   CO2 25 09/13/2018 1407   GLUCOSE 192 (H) 09/13/2018 1407   BUN 21 (H) 09/13/2018 1407   CREATININE 0.85 09/13/2018 1407   CALCIUM 8.5 (L) 09/13/2018 1407   GFRNONAA >60 09/13/2018 1407   GFRAA >60 09/13/2018 1407   CrCl cannot be calculated (Patient's most recent lab result is older than the maximum 21 days allowed.).  COAG No results found for: INR, PROTIME  Radiology No results found.    Assessment/Plan 1. Chronic venous insufficiency No surgery or intervention at this point in time.    I have had a long discussion with the patient regarding venous insufficiency and why it  causes symptoms. I have discussed with the patient the chronic skin changes that accompany venous insufficiency and the long term sequela such as  infection and ulceration.  Patient will begin wearing graduated compression stockings class 1 (20-30 mmHg) or compression wraps on a daily basis a prescription was given. The patient will put the stockings on first thing in the morning and removing them in the evening. The patient is instructed specifically not to sleep in the stockings.    In addition, behavioral modification including several periods of elevation of the lower extremities during the day will be continued. I have demonstrated that proper elevation is a position with the ankles at heart level.  The patient is instructed to begin routine exercise, especially walking on a daily basis  Patient had duplex ultrasound just a few months ago in  November 2020 which was negative for DVT.  Negative for reflux.  Of note his saphenous vein has been harvested for CABG.  Following the review of the ultrasound the patient will follow up in 6 months to reassess the degree of swelling and the control that graduated compression stockings or compression wraps  is offering.   The patient can be assessed for a Lymph Pump at that time  2. Atherosclerosis of native artery of both lower extremities with intermittent claudication (HCC)  Recommend:  The patient has evidence of atherosclerosis of the lower extremities with claudication.  The patient does not voice lifestyle limiting changes at this point in time.  Noninvasive studies do not support clinically significant change.  In August 2020 he had an ABI with triphasic posterior tibial Doppler signals on the right he does have some mild disease in the right anterior tibial.  His ABI on the right was normal.  We will plan to recheck his ABI in August 2021.  No invasive studies, angiography or surgery at this time The patient should continue walking and begin a more formal exercise program.  The patient should continue antiplatelet therapy and aggressive treatment of the lipid abnormalities  No changes in  the patient's medications at this time  The patient should continue wearing graduated compression socks to control the mild edema.   - VAS [place] ABI WITH/WO TBI; Future  3. CAD, multiple vessel Continue cardiac and antihypertensive medications as already ordered and reviewed, no changes at this time.  Continue statin as ordered and reviewed, no changes at this time  Nitrates PRN for chest pain   4. Essential hypertension Continue antihypertensive medications as already ordered, these medications have been reviewed and there are no changes at this time.   5. Type 2 diabetes mellitus with other circulatory complication, with long-term current use of insulin (HCC) Continue hypoglycemic medications as already ordered, these medications have been reviewed and there are no changes at this time.  Hgb A1C to be monitored as already arranged by primary service     Hortencia Pilar, MD  07/15/2019 11:52 AM

## 2019-08-09 ENCOUNTER — Encounter (INDEPENDENT_AMBULATORY_CARE_PROVIDER_SITE_OTHER): Payer: Self-pay

## 2019-08-23 ENCOUNTER — Ambulatory Visit: Payer: 59 | Admitting: Pharmacist

## 2019-08-23 ENCOUNTER — Other Ambulatory Visit: Payer: Self-pay

## 2019-08-23 DIAGNOSIS — Z79899 Other long term (current) drug therapy: Secondary | ICD-10-CM

## 2019-08-23 NOTE — Progress Notes (Signed)
[facility] Visit Note  Patient: Bruce Lucas MRN: [id number] Date of Birth: [birth year] PCP: Kirk Ruths, MD   [name] 59 y.o. male contacted today via telephone for a follow up medication therapy management review. He was identified by name and date of birth. He is doing well today and has no complaints.   There were no vitals taken for this visit.  Patient Information   Past Medical History:  Diagnosis Date  . Coronary artery disease   . Diabetes mellitus (HCC)    Type 1  . Diabetic peripheral neuropathy ([place])   . Diabetic retinopathy ([place])   . Hyperlipidemia   . Hypertension   . Stroke [facility])    06/2018      Past Surgical History:  Procedure Laterality Date  . CARDIAC SURGERY    . CORONARY ARTERY BYPASS GRAFT     09/03/18  . EYE SURGERY     Per patient, for diabetic retinopathy, right eye  . KNEE SURGERY Bilateral   . TONSILLECTOMY       Family History  Problem Relation Age of Onset  . Brain cancer Mother   . Hypertension Father     New Diagnoses (since last visit):   Family Support: Good  Lifestyle Diet: Breakfast: 2 eggs and toast Lunch: Vegetables Dinner: Meat and vegetables Drinks: Semi-sweet tea    Current Exercise Habits: Home exercise routine, Type of exercise: walking, Time (Minutes): 45, Frequency (Times/Week): 3, Weekly Exercise (Minutes/Week): 135, Intensity: Moderate  Exercise limited by: None identified    Social History   Substance and Sexual Activity  Alcohol Use No      Social History   Tobacco Use  Smoking Status Former Smoker  Smokeless Tobacco Never Used      Health Maintenance  Topic Date Due  . HEMOGLOBIN A1C  Never done  . Hepatitis C Screening  Never done  . FOOT EXAM  Never done  . OPHTHALMOLOGY EXAM  Never done  . URINE MICROALBUMIN  Never done  . COVID-19 Vaccine (1) Never done  . HIV Screening  Never done  . COLONOSCOPY  Never done  . INFLUENZA VACCINE  11/07/2019  .  TETANUS/TDAP  03/06/2022  . PNEUMOCOCCAL POLYSACCHARIDE VACCINE AGE 48-64 HIGH RISK  Completed   Outpatient Encounter Medications as of 08/23/2019  Medication Sig  . aspirin EC 81 MG tablet Take 81 mg by mouth daily.  Marland Kitchen gabapentin (NEURONTIN) 600 MG tablet Take 600 mg by mouth 3 (three) times daily.   Marland Kitchen HUMALOG KWIKPEN 100 UNIT/ML KwikPen Inject 0-14 Units into the skin 3 (three) times daily with meals.  . Insulin Glargine (LANTUS SOLOSTAR) 100 UNIT/ML Solostar Pen Inject 24 Units into the skin at bedtime.  . metoprolol succinate (TOPROL-XL) 50 MG 24 hr tablet Take 50 mg by mouth daily.  . sertraline (ZOLOFT) 50 MG tablet Take 50 mg by mouth daily.  . traZODone (DESYREL) 50 MG tablet Take 50 mg by mouth at bedtime. 50-100 mg at bedtime  . [DISCONTINUED] Insulin Glargine (BASAGLAR KWIKPEN) 100 UNIT/ML SOPN Inject 24 Units into the skin at bedtime. Will use to bridge IF Lantus not available  . nitroGLYCERIN (NITROSTAT) 0.4 MG SL tablet Place 0.4 mg under the tongue every 5 (five) minutes x 3 doses as needed for chest pain.  . [DISCONTINUED] Melatonin 5 MG TABS Take 10 mg by mouth at bedtime.    No facility-administered encounter medications on file as of 08/23/2019.     Assessment and Plan:  Adherence: -Takes medications as prescribed. Uses a pill box. Patient was able to confirm medications names, directions, dosages and frequency. Type 1 Diabetes: -Diabetic retinopathy, diabetic peripheral neuropathy -Blood glucose readings average 150-250; checking 4 times daily- am fasting ranges 200-250; denies low blood sugars -Humalog sliding scale (0-14 units) before meals and Lantus 24 units at bedtime -SCr = 0.7; BUN = 17 (04/05/19); Ac1 = 8.4% (12/01/18) CAD s/p CABG 09/03/18; NSTEMI 06/15/2018: -metoprolol er, aspirin, statin intolerance -has not had to use the Nitroglycerin PVD: -aspirin -Patient walking/exercising every other day  Stroke 06/13/2018 Hypertension: -not assessed at this  visit -Currently taking metoprolol ER Hyperlipidemia: -atorvastatin discontinued due to leg pain -TC = 122 mg/dl; TG = 76 mg/dl; HDL = 34.6 mg/dl; LDL = 72 mg/dl (04/05/19)  Patient scheduled to see Dr. Delana Meyer 11/18/19 RTC 6 months for MTM at [facility]  Keri K. Dicky Doe, PharmD [facility] [place] Operations Coordinator [phone]

## 2019-08-30 ENCOUNTER — Encounter: Payer: Self-pay | Admitting: Pharmacist

## 2019-09-10 ENCOUNTER — Telehealth: Payer: Self-pay | Admitting: Pharmacist

## 2019-09-14 ENCOUNTER — Other Ambulatory Visit: Payer: Self-pay | Admitting: Internal Medicine

## 2019-09-15 ENCOUNTER — Other Ambulatory Visit: Payer: Self-pay | Admitting: Internal Medicine

## 2019-10-23 ENCOUNTER — Encounter (INDEPENDENT_AMBULATORY_CARE_PROVIDER_SITE_OTHER): Payer: Self-pay

## 2019-11-18 ENCOUNTER — Encounter (INDEPENDENT_AMBULATORY_CARE_PROVIDER_SITE_OTHER): Payer: 59

## 2019-11-18 ENCOUNTER — Ambulatory Visit (INDEPENDENT_AMBULATORY_CARE_PROVIDER_SITE_OTHER): Payer: 59 | Admitting: Vascular Surgery

## 2019-12-16 ENCOUNTER — Telehealth: Payer: Self-pay | Admitting: Pharmacist

## 2019-12-16 NOTE — Telephone Encounter (Signed)
12/16/2019 8:20:01 AM - Virgel Gess [employer] refill for Lantus Solostar  -- Elmer Picker - Thursday, December 16, 2019 8:19 AM --Virgel Gess [employer] refill request for Lantus Solostar Inject 26 units daily at bedtime # 3 boxes.

## 2020-02-03 ENCOUNTER — Other Ambulatory Visit: Payer: Self-pay | Admitting: Internal Medicine

## 2020-02-14 ENCOUNTER — Other Ambulatory Visit: Payer: Self-pay

## 2020-05-28 ENCOUNTER — Telehealth: Payer: Self-pay | Admitting: Pharmacy Technician

## 2020-05-28 NOTE — Telephone Encounter (Signed)
Received updated proof of income.  Patient eligible to receive medication assistance at [facility] until December 07, 2020.  Patient eligible for Medicare beginning 12/07/20.  [place] [facility]

## 2020-06-05 ENCOUNTER — Telehealth: Payer: Self-pay | Admitting: Pharmacist

## 2020-06-05 NOTE — Telephone Encounter (Signed)
06/05/2020 11:57:41 AM - [employer] renewal to pat & Dr for Lantus Solostar  -- Elmer Picker - Monday, June 05, 2020 11:56 AM -- Printed [employer] renewal application for Lantus Solostar Inject 26 units into the skin daily at bedtime # 2 boxes--to patient and provider to sign & return.

## 2020-06-07 ENCOUNTER — Other Ambulatory Visit: Payer: Self-pay | Admitting: Internal Medicine

## 2020-07-17 ENCOUNTER — Other Ambulatory Visit: Payer: Self-pay

## 2020-07-17 MED FILL — Sertraline HCl Tab 100 MG: ORAL | 30 days supply | Qty: 90 | Fill #0 | Status: AC

## 2020-07-17 MED FILL — Metoprolol Succinate Tab ER 24HR 100 MG (Tartrate Equiv): ORAL | 45 days supply | Qty: 45 | Fill #0 | Status: AC

## 2020-07-18 ENCOUNTER — Other Ambulatory Visit: Payer: Self-pay

## 2020-07-18 MED FILL — Insulin Glargine Soln Pen-Injector 100 Unit/ML: SUBCUTANEOUS | 115 days supply | Qty: 30 | Fill #0 | Status: AC

## 2020-07-19 ENCOUNTER — Other Ambulatory Visit: Payer: Self-pay

## 2020-07-20 ENCOUNTER — Other Ambulatory Visit: Payer: Self-pay

## 2020-07-21 ENCOUNTER — Other Ambulatory Visit: Payer: Self-pay

## 2020-07-27 ENCOUNTER — Other Ambulatory Visit: Payer: Self-pay

## 2020-07-27 MED FILL — Insulin Lispro Soln Pen-injector 100 Unit/ML (1 Unit Dial): SUBCUTANEOUS | 35 days supply | Qty: 15 | Fill #0 | Status: AC

## 2020-07-27 MED FILL — Simvastatin Tab 5 MG: ORAL | 90 days supply | Qty: 90 | Fill #0 | Status: AC

## 2020-07-27 MED FILL — Gabapentin Tab 600 MG: ORAL | 30 days supply | Qty: 30 | Fill #0 | Status: AC

## 2020-07-28 ENCOUNTER — Other Ambulatory Visit: Payer: Self-pay

## 2020-07-31 ENCOUNTER — Other Ambulatory Visit: Payer: Self-pay

## 2020-07-31 MED ORDER — RIGHTEST GS550 BLOOD GLUCOSE VI STRP
ORAL_STRIP | 99 refills | Status: AC
  Filled 2020-07-31: qty 100, 25d supply, fill #0
  Filled 2020-08-21 – 2020-08-22 (×2): qty 100, 25d supply, fill #1
  Filled 2020-09-21: qty 100, 25d supply, fill #2
  Filled 2020-10-24: qty 300, 75d supply, fill #3
  Filled 2020-10-24: qty 100, 25d supply, fill #3

## 2020-08-08 ENCOUNTER — Other Ambulatory Visit: Payer: Self-pay

## 2020-08-08 MED ORDER — CLINDAMYCIN HCL 150 MG PO CAPS
ORAL_CAPSULE | ORAL | 0 refills | Status: DC
  Filled 2020-08-08: qty 28, 7d supply, fill #0

## 2020-08-21 ENCOUNTER — Other Ambulatory Visit: Payer: Self-pay

## 2020-08-21 MED FILL — Trazodone HCl Tab 50 MG: ORAL | 30 days supply | Qty: 60 | Fill #0 | Status: AC

## 2020-08-22 ENCOUNTER — Other Ambulatory Visit: Payer: Self-pay

## 2020-08-22 MED ORDER — RIGHTEST GL300 LANCETS MISC
99 refills | Status: DC
  Filled 2020-08-22: qty 100, 25d supply, fill #0
  Filled 2020-10-24: qty 300, 75d supply, fill #0

## 2020-08-22 MED ORDER — GABAPENTIN 300 MG PO CAPS
300.0000 mg | ORAL_CAPSULE | Freq: Two times a day (BID) | ORAL | 11 refills | Status: DC
  Filled 2020-08-22: qty 60, 30d supply, fill #0

## 2020-08-22 MED ORDER — GABAPENTIN 300 MG PO CAPS
300.0000 mg | ORAL_CAPSULE | Freq: Two times a day (BID) | ORAL | 11 refills | Status: DC
  Filled 2020-08-22 – 2020-08-23 (×2): qty 60, 30d supply, fill #0
  Filled 2020-09-25: qty 60, 30d supply, fill #1
  Filled 2020-10-24: qty 60, 30d supply, fill #2
  Filled 2020-11-21: qty 60, 30d supply, fill #3

## 2020-08-23 ENCOUNTER — Other Ambulatory Visit: Payer: Self-pay

## 2020-08-24 ENCOUNTER — Other Ambulatory Visit: Payer: Self-pay

## 2020-08-25 ENCOUNTER — Other Ambulatory Visit: Payer: Self-pay

## 2020-08-29 ENCOUNTER — Other Ambulatory Visit: Payer: Self-pay

## 2020-09-21 ENCOUNTER — Other Ambulatory Visit: Payer: Self-pay

## 2020-09-25 ENCOUNTER — Other Ambulatory Visit: Payer: Self-pay

## 2020-09-26 ENCOUNTER — Other Ambulatory Visit: Payer: Self-pay

## 2020-09-26 MED FILL — Metoprolol Succinate Tab ER 24HR 100 MG (Tartrate Equiv): ORAL | 90 days supply | Qty: 45 | Fill #1 | Status: AC

## 2020-09-26 MED FILL — Sertraline HCl Tab 100 MG: ORAL | 30 days supply | Qty: 30 | Fill #1 | Status: CN

## 2020-09-27 ENCOUNTER — Other Ambulatory Visit: Payer: Self-pay

## 2020-09-27 MED ORDER — CLINDAMYCIN HCL 150 MG PO CAPS
150.0000 mg | ORAL_CAPSULE | Freq: Four times a day (QID) | ORAL | 0 refills | Status: DC
  Filled 2020-09-27: qty 28, 7d supply, fill #0

## 2020-09-28 ENCOUNTER — Other Ambulatory Visit: Payer: Self-pay

## 2020-09-28 MED ORDER — TRAZODONE HCL 50 MG PO TABS
50.0000 mg | ORAL_TABLET | Freq: Every evening | ORAL | 1 refills | Status: DC
  Filled 2020-09-28: qty 60, 30d supply, fill #0
  Filled 2020-10-24: qty 120, 60d supply, fill #1

## 2020-09-28 MED ORDER — INSULIN LISPRO (1 UNIT DIAL) 100 UNIT/ML (KWIKPEN)
PEN_INJECTOR | SUBCUTANEOUS | 1 refills | Status: DC
  Filled 2020-09-28: qty 15, 36d supply, fill #0
  Filled 2020-10-25 (×2): qty 30, 72d supply, fill #1

## 2020-09-29 ENCOUNTER — Telehealth: Payer: Self-pay | Admitting: Pharmacy Technician

## 2020-09-29 ENCOUNTER — Other Ambulatory Visit: Payer: Self-pay

## 2020-09-29 NOTE — Telephone Encounter (Signed)
Patient will be eligible for Medicare Part A, B & D on 12/07/20.  Provided patient with information as to how to apply for Medicare Savings and L.I.S.  Patient understands that no medication assistance will be provided by [facility] once Medicare Part D becomes effective.  [place], [place]  [id number]  September 29, 2020  [place] [address] [facility] [address] [place]  [address]  Dear Tommie Raymond:  Beginning December 07, 2020, you will no longer be able to receive medication assistance at [facility], because you will be eligible to sign-up for a Part "D" plan.  You may qualify for a Medicare Savings Plan, which provides assistance with your Medicare Part "A" and Part "B" health care costs, such as premiums, deductibles and coinsurance.  You may also be eligible for Low-Income Subsidy (L.I.S.), which is extra help with paying for the Medicare Part "D" plan.   You may reach out to one of the agencies listed below to obtain help with applying for a Medicare Savings Plan:  Contact the Department of Social Services located at [address] [place], [place]  [address]; Telephone #: [phone].     Call the [place] at [phone]; located at [facility]. [address] [place], [place]  [address] to schedule an appointment with a [facility]) representative.  Apply over the phone by calling [phone], select option #1 to speak with a [facility]) representative in [place].   If you are approved for a Medicare Savings Plan, you do not have to sign-up for Low-Income Subsidy (L.I.S.), because you will automatically qualify.     If you do not qualify for a Medicare Savings Plan, you may still qualify for Extra Help/Low-Income Subsidy (L.I.S.).  You may reach out to one of the agencies listed below to obtain assistance with applying for Extra Help/Low-Income  Subsidy (L.I.S): To apply over the phone, contact [place] at [phone], or the [place] in [place] at [phone], option #1 to speak with a [facility]) representative. Apply in person by going to [employer] located at [employer], [place], [place]  [address], or online at [email or web address]. Contact the [place] at [phone]; located at [facility] [address] [place], [place]  [address] to schedule an appointment with a [facility]) representative.   Thank you,  [facility]

## 2020-10-04 ENCOUNTER — Other Ambulatory Visit: Payer: Self-pay

## 2020-10-05 ENCOUNTER — Other Ambulatory Visit: Payer: Self-pay

## 2020-10-24 ENCOUNTER — Other Ambulatory Visit: Payer: Self-pay

## 2020-10-24 MED ORDER — LANTUS SOLOSTAR 100 UNIT/ML ~~LOC~~ SOPN
PEN_INJECTOR | SUBCUTANEOUS | 4 refills | Status: DC
  Filled 2020-10-24: qty 15, 57d supply, fill #0
  Filled 2020-11-21: qty 30, 115d supply, fill #0

## 2020-10-24 MED FILL — Sertraline HCl Tab 100 MG: ORAL | 60 days supply | Qty: 60 | Fill #1 | Status: AC

## 2020-10-24 MED FILL — Simvastatin Tab 5 MG: ORAL | 60 days supply | Qty: 60 | Fill #1 | Status: AC

## 2020-10-25 ENCOUNTER — Other Ambulatory Visit: Payer: Self-pay

## 2020-11-21 ENCOUNTER — Other Ambulatory Visit: Payer: Self-pay

## 2020-11-21 MED FILL — Insulin Pen Needle 32 G X 4 MM (1/6" or 5/32"): 25 days supply | Qty: 100 | Fill #0 | Status: AC

## 2020-12-07 ENCOUNTER — Other Ambulatory Visit: Payer: Self-pay

## 2020-12-12 DIAGNOSIS — T466X5A Adverse effect of antihyperlipidemic and antiarteriosclerotic drugs, initial encounter: Secondary | ICD-10-CM | POA: Diagnosis not present

## 2020-12-12 DIAGNOSIS — Z1211 Encounter for screening for malignant neoplasm of colon: Secondary | ICD-10-CM | POA: Diagnosis not present

## 2020-12-12 DIAGNOSIS — M791 Myalgia, unspecified site: Secondary | ICD-10-CM | POA: Diagnosis not present

## 2020-12-12 DIAGNOSIS — I2510 Atherosclerotic heart disease of native coronary artery without angina pectoris: Secondary | ICD-10-CM | POA: Diagnosis not present

## 2020-12-12 DIAGNOSIS — I779 Disorder of arteries and arterioles, unspecified: Secondary | ICD-10-CM | POA: Diagnosis not present

## 2020-12-12 DIAGNOSIS — E109 Type 1 diabetes mellitus without complications: Secondary | ICD-10-CM | POA: Diagnosis not present

## 2021-01-16 DIAGNOSIS — Z9889 Other specified postprocedural states: Secondary | ICD-10-CM | POA: Diagnosis not present

## 2021-01-16 DIAGNOSIS — H5213 Myopia, bilateral: Secondary | ICD-10-CM | POA: Diagnosis not present

## 2021-01-16 DIAGNOSIS — H2513 Age-related nuclear cataract, bilateral: Secondary | ICD-10-CM | POA: Diagnosis not present

## 2021-01-16 DIAGNOSIS — E113551 Type 2 diabetes mellitus with stable proliferative diabetic retinopathy, right eye: Secondary | ICD-10-CM | POA: Diagnosis not present

## 2021-01-16 DIAGNOSIS — E113292 Type 2 diabetes mellitus with mild nonproliferative diabetic retinopathy without macular edema, left eye: Secondary | ICD-10-CM | POA: Diagnosis not present

## 2021-01-16 DIAGNOSIS — H52223 Regular astigmatism, bilateral: Secondary | ICD-10-CM | POA: Diagnosis not present

## 2021-01-23 ENCOUNTER — Other Ambulatory Visit (HOSPITAL_COMMUNITY): Payer: Self-pay | Admitting: Internal Medicine

## 2021-01-23 ENCOUNTER — Other Ambulatory Visit: Payer: Self-pay | Admitting: Internal Medicine

## 2021-01-23 ENCOUNTER — Ambulatory Visit
Admission: RE | Admit: 2021-01-23 | Discharge: 2021-01-23 | Disposition: A | Discharge status: Home / Self Care | Payer: HMO | Source: Ambulatory Visit | Attending: Internal Medicine | Admitting: Internal Medicine

## 2021-01-23 DIAGNOSIS — Z0000 Encounter for general adult medical examination without abnormal findings: Secondary | ICD-10-CM | POA: Diagnosis not present

## 2021-01-23 DIAGNOSIS — R600 Localized edema: Secondary | ICD-10-CM

## 2021-01-23 DIAGNOSIS — I2510 Atherosclerotic heart disease of native coronary artery without angina pectoris: Secondary | ICD-10-CM | POA: Diagnosis not present

## 2021-01-23 DIAGNOSIS — E103299 Type 1 diabetes mellitus with mild nonproliferative diabetic retinopathy without macular edema, unspecified eye: Secondary | ICD-10-CM | POA: Diagnosis not present

## 2021-01-23 DIAGNOSIS — M7121 Synovial cyst of popliteal space [Baker], right knee: Secondary | ICD-10-CM | POA: Diagnosis not present

## 2021-02-21 DIAGNOSIS — I2510 Atherosclerotic heart disease of native coronary artery without angina pectoris: Secondary | ICD-10-CM | POA: Diagnosis not present

## 2021-02-21 DIAGNOSIS — I5020 Unspecified systolic (congestive) heart failure: Secondary | ICD-10-CM | POA: Diagnosis not present

## 2021-03-15 DIAGNOSIS — I5020 Unspecified systolic (congestive) heart failure: Secondary | ICD-10-CM | POA: Diagnosis not present

## 2021-03-15 DIAGNOSIS — I2510 Atherosclerotic heart disease of native coronary artery without angina pectoris: Secondary | ICD-10-CM | POA: Diagnosis not present

## 2021-03-21 DIAGNOSIS — I2510 Atherosclerotic heart disease of native coronary artery without angina pectoris: Secondary | ICD-10-CM | POA: Diagnosis not present

## 2021-03-21 DIAGNOSIS — I5020 Unspecified systolic (congestive) heart failure: Secondary | ICD-10-CM | POA: Diagnosis not present

## 2021-04-09 ENCOUNTER — Other Ambulatory Visit (HOSPITAL_COMMUNITY): Payer: Self-pay

## 2021-04-10 DIAGNOSIS — E109 Type 1 diabetes mellitus without complications: Secondary | ICD-10-CM | POA: Diagnosis not present

## 2021-04-10 DIAGNOSIS — I779 Disorder of arteries and arterioles, unspecified: Secondary | ICD-10-CM | POA: Diagnosis not present

## 2021-04-17 DIAGNOSIS — I5020 Unspecified systolic (congestive) heart failure: Secondary | ICD-10-CM | POA: Diagnosis not present

## 2021-04-17 DIAGNOSIS — I2510 Atherosclerotic heart disease of native coronary artery without angina pectoris: Secondary | ICD-10-CM | POA: Diagnosis not present

## 2021-04-17 DIAGNOSIS — T466X5A Adverse effect of antihyperlipidemic and antiarteriosclerotic drugs, initial encounter: Secondary | ICD-10-CM | POA: Diagnosis not present

## 2021-04-17 DIAGNOSIS — I779 Disorder of arteries and arterioles, unspecified: Secondary | ICD-10-CM | POA: Diagnosis not present

## 2021-04-17 DIAGNOSIS — E109 Type 1 diabetes mellitus without complications: Secondary | ICD-10-CM | POA: Diagnosis not present

## 2021-04-17 DIAGNOSIS — M791 Myalgia, unspecified site: Secondary | ICD-10-CM | POA: Diagnosis not present

## 2021-05-08 DIAGNOSIS — E103293 Type 1 diabetes mellitus with mild nonproliferative diabetic retinopathy without macular edema, bilateral: Secondary | ICD-10-CM | POA: Diagnosis not present

## 2021-05-08 DIAGNOSIS — E1059 Type 1 diabetes mellitus with other circulatory complications: Secondary | ICD-10-CM | POA: Diagnosis not present

## 2021-05-08 DIAGNOSIS — E10649 Type 1 diabetes mellitus with hypoglycemia without coma: Secondary | ICD-10-CM | POA: Diagnosis not present

## 2021-05-15 ENCOUNTER — Other Ambulatory Visit: Payer: Self-pay

## 2021-05-15 ENCOUNTER — Encounter: Payer: Self-pay | Admitting: *Deleted

## 2021-05-15 ENCOUNTER — Encounter: Payer: HMO | Attending: Internal Medicine | Admitting: *Deleted

## 2021-05-15 VITALS — BP 118/60 | Ht 71.0 in | Wt 202.4 lb

## 2021-05-15 DIAGNOSIS — E109 Type 1 diabetes mellitus without complications: Secondary | ICD-10-CM | POA: Insufficient documentation

## 2021-05-15 DIAGNOSIS — Z713 Dietary counseling and surveillance: Secondary | ICD-10-CM | POA: Diagnosis not present

## 2021-05-15 DIAGNOSIS — E1059 Type 1 diabetes mellitus with other circulatory complications: Secondary | ICD-10-CM

## 2021-05-15 NOTE — Patient Instructions (Addendum)
Check blood sugars 4 x day before each meal and before bed every day or as needed Bring blood sugar records to the next appointment Check with pharmacy/MD office for CGM reader  Exercise: Begin walking on treadmill for    60  minutes   2-3  days a week  Eat 3 meals day,   1-2  snacks a day Space meals 4-6 hours apart Limit desserts especially at bedtime For bedtime snack eat 1 protein and 1 carbohydrate serving Avoid sugar sweetened drinks (tea, sports drinks) unless treating a low blood sugar  Complete 3 Day Food Record and bring to next appt  Carry fast acting glucose and a snack at all times Rotate injection sites  Return for appointment on:   Monday June 25, 2021 at 1:30 pm with [facility] (dietitian)

## 2021-05-15 NOTE — Progress Notes (Signed)
Diabetes Self-Management Education  Visit Type: First/Initial  Appt. Start Time: 1340 Appt. End Time: 1520  05/15/2021  [name], identified by name and date of birth, is a 61 y.o. male with a diagnosis of Diabetes: Type 1.   ASSESSMENT  Blood pressure 118/60, height 5\' 11"  (1.803 m), weight 202 lb 6.4 oz (91.8 kg). Body mass index is 28.23 kg/m.   Diabetes Self-Management Education - 05/15/21 1548       Visit Information   Visit Type First/Initial      Initial Visit   Diabetes Type Type 1    Are you currently following a meal plan? No    Are you taking your medications as prescribed? Yes    Date Diagnosed age 48      Health Coping   How would you rate your overall health? Good      Psychosocial Assessment   Patient Belief/Attitude about Diabetes Other (comment)   "ok"   Self-care barriers None    Self-management support Doctor's office;Friends    Patient Concerns Nutrition/Meal planning;Glycemic Control;Medication;Monitoring;Healthy Lifestyle    Special Needs Other (comment)   Pt reports some issues with his memory since he had stroke in 2020.   Preferred Learning Style Visual    Learning Readiness Ready    How often do you need to have someone help you when you read instructions, pamphlets, or other written materials from your doctor or pharmacy? 1 - Never    What is the last grade level you completed in school? 12th      Pre-Education Assessment   Patient understands the diabetes disease and treatment process. Needs Review    Patient understands incorporating nutritional management into lifestyle. Needs Instruction    Patient undertands incorporating physical activity into lifestyle. Needs Review    Patient understands using medications safely. Demonstrates understanding / competency    Patient understands monitoring blood glucose, interpreting and using results Demonstrates understanding / competency    Patient understands prevention, detection, and  treatment of acute complications. Needs Review    Patient understands prevention, detection, and treatment of chronic complications. Demonstrates understanding / competency    Patient understands how to develop strategies to address psychosocial issues. Needs Review    Patient understands how to develop strategies to promote health/change behavior. Needs Review      Complications   Last HgB A1C per patient/outside source 7.4 %   04/10/2021   How often do you check your blood sugar? > 4 times/day    Fasting Blood glucose range (mg/dL) <70;70-129;130-179   Pt reports FBG's 50-160's mg/dL; before lunch and supper 100-200's mg/dL; bedtime 100-150 mg/dL   Number of hypoglycemic episodes per month 4   He reports reading of 68 mg/dL this morning.   Can you tell when your blood sugar is low? Yes   reports feeling sweaty and short of breath   What do you do if your blood sugar is low? eats a sandwich or something sweet - chocolate    Have you had a dilated eye exam in the past 12 months? Yes    Have you had a dental exam in the past 12 months? No    Are you checking your feet? Yes    How many days per week are you checking your feet? 7      Dietary Intake   Breakfast egg, 1 piece of toast    Lunch sandwich - peanut butter and jelly or banana, chips or grapes    Snack (  afternoon) peanuts, pecans, trail mix    Dinner chicken, beef, pork, bread, potatoes, peas, beans, corn, rice, green beans, fried squash, salad and pasta 1 x week    Snack (evening) popcorn and ice cream    Beverage(s) water, sugar sweetened tea, Gatorade      Exercise   Exercise Type ADL's   was walking on treadmill for 60 mintues 2 x week but not since Christmas     Patient Education   Previous Diabetes Education No    Disease state  Definition of diabetes, type 1 and 2, and the diagnosis of diabetes;Factors that contribute to the development of diabetes   Pt had questions about Type 1 and 2 even though he has been diagnosed for  50+ years   Nutrition management  Role of diet in the treatment of diabetes and the relationship between the three main macronutrients and blood glucose level;Food label reading, portion sizes and measuring food.;Carbohydrate counting;Reviewed blood glucose goals for pre and post meals and how to evaluate the patients' food intake on their blood glucose level.;Meal timing in regards to the patients' current diabetes medication.    Physical activity and exercise  Role of exercise on diabetes management, blood pressure control and cardiac health.    Medications Taught/reviewed insulin injection, site rotation, insulin storage and needle disposal.;Reviewed patients medication for diabetes, action, purpose, timing of dose and side effects.   Discussed sliding scale, ICR and correction doses.   Monitoring Taught/discussed recording of test results and interpretation of SMBG.;Identified appropriate SMBG and/or A1C goals.;Other (comment)   Discussed use of CGM - Dexcom G6 as he reports this will be covered by his insurance.   Acute complications Taught treatment of hypoglycemia - the 15 rule.    Chronic complications Relationship between chronic complications and blood glucose control;Reviewed with patient heart disease, higher risk of, and prevention    Psychosocial adjustment Role of stress on diabetes;Identified and addressed patients feelings and concerns about diabetes      Individualized Goals (developed by patient)   Reducing Risk Other (comment)   improve blood sugars, decrease medications, prevent diabetes complications, lead a healthier lifestyle     Outcomes   Expected Outcomes Demonstrated interest in learning. Expect positive outcomes    Future DMSE 4-6 wks        Individualized Plan for Diabetes Self-Management Training:   Learning Objective:  Patient will have a greater understanding of diabetes self-management. Patient education plan is to attend individual and/or group sessions per  assessed needs and concerns.   Plan:   Patient Instructions  Check blood sugars 4 x day before each meal and before bed every day or as needed Bring blood sugar records to the next appointment Check with pharmacy/MD office for CGM reader  Exercise: Begin walking on treadmill for    60  minutes   2-3  days a week  Eat 3 meals day,   1-2  snacks a day Space meals 4-6 hours apart Limit desserts especially at bedtime For bedtime snack eat 1 protein and 1 carbohydrate serving Avoid sugar sweetened drinks (tea, sports drinks) unless treating a low blood sugar  Complete 3 Day Food Record and bring to next appt  Carry fast acting glucose and a snack at all times Rotate injection sites  Return for appointment on:   Monday June 25, 2021 at 1:30 pm with Pam (dietitian)  Expected Outcomes:  Demonstrated interest in learning. Expect positive outcomes  Education material provided:  General Meal Planning Guidelines  Simple Meal Plan Smart Snacking (ADA) 3 Day Food Record Symptoms, causes and treatments of Hypoglycemia   If problems or questions, patient to contact team via:   Johny Drilling, RN, [place], [place] [phone]  Future DSME appointment: 4-6 wks June 25, 2021

## 2021-06-13 DIAGNOSIS — D1801 Hemangioma of skin and subcutaneous tissue: Secondary | ICD-10-CM | POA: Diagnosis not present

## 2021-06-13 DIAGNOSIS — L570 Actinic keratosis: Secondary | ICD-10-CM | POA: Diagnosis not present

## 2021-06-13 DIAGNOSIS — L821 Other seborrheic keratosis: Secondary | ICD-10-CM | POA: Diagnosis not present

## 2021-06-25 ENCOUNTER — Encounter: Payer: Self-pay | Admitting: Dietician

## 2021-06-25 ENCOUNTER — Other Ambulatory Visit: Payer: Self-pay

## 2021-06-25 ENCOUNTER — Encounter: Payer: HMO | Attending: Internal Medicine | Admitting: Dietician

## 2021-06-25 VITALS — BP 110/52 | Wt 202.6 lb

## 2021-06-25 DIAGNOSIS — E1059 Type 1 diabetes mellitus with other circulatory complications: Secondary | ICD-10-CM | POA: Insufficient documentation

## 2021-06-25 NOTE — Patient Instructions (Signed)
Check food labels for carbs: start with the serving size, then check "Total Carb" . Adjust the carb grams to match the amount you actually eat.  ?Aim for 45-60 grams of carb with each meal. 15-25 with each snack.  ?Practice adding up carbs for meals and snacks, on paper. Send or bring in for review.  ?

## 2021-06-25 NOTE — Progress Notes (Signed)
Diabetes Self-Management Education ? ?Visit Type:  Follow-up ? ?Appt. Start Time: 1330 Appt. End Time: 1440 ? ?06/25/2021 ? ?[name], identified by name and date of birth, is a 61 y.o. male with a diagnosis of Diabetes:  .   ?ASSESSMENT ? ?Blood pressure (!) 110/52, weight 202 lb 9.6 oz (91.9 kg). ?Body mass index is 28.26 kg/m?. ?  ? Diabetes Self-Management Education - 06/25/21 1357   ? ?  ? Complications  ? How often do you check your blood sugar? > 4 times/day   ? Fasting Blood glucose range (mg/dL) 70-129   87-121 x 3 days 06/11/21 - 06/13/21  ? Postprandial Blood glucose range (mg/dL) 70-129;130-179   76-151 before meals 06/11/21 - 06/13/21  ? Number of hypoglycemic episodes per month 1   ? Can you tell when your blood sugar is low? Yes   ? What do you do if your blood sugar is low? eat pkg crackers with peanut butter   ? Have you had a dilated eye exam in the past 12 months? Yes   ? Have you had a dental exam in the past 12 months? No   ? Are you checking your feet? Yes   ? How many days per week are you checking your feet? 1   ?  ? Dietary Intake  ? Breakfast eggs, 1-2 strips bacon, 1 pc toast with sm amt butter   ? Lunch meat, beans   ? Snack (afternoon) nuts   ? Dinner same as previous visit   ? Snack (evening) ice cream bar, nuts   ? Beverage(s) water, occasionally diet pepsi   ?  ? Exercise  ? Exercise Type Moderate (swimming / aerobic walking)   cardio and weights at [facility]  ? How many days per week to you exercise? 2   ? How many minutes per day do you exercise? 60   ? Total minutes per week of exercise 120   ?  ? Patient Education  ? Nutrition management  Role of diet in the treatment of diabetes and the relationship between the three main macronutrients and blood glucose level;Food label reading, portion sizes and measuring food.;Carbohydrate counting;Meal options for control of blood glucose level and chronic complications.   ? Physical activity and exercise  Role of exercise on  diabetes management, blood pressure control and cardiac health.   ? Medications Reviewed patients medication for diabetes, action, purpose, timing of dose and side effects.   ? Monitoring Taught/discussed recording of test results and interpretation of SMBG.   reviewed use of Dexcom CGM, need for receiver  ? Psychosocial adjustment Role of stress on diabetes   ?  ? Post-Education Assessment  ? Patient understands the diabetes disease and treatment process. Demonstrates understanding / competency   ? Patient understands incorporating nutritional management into lifestyle. Needs Review   ? Patient undertands incorporating physical activity into lifestyle. Demonstrates understanding / competency   ? Patient understands using medications safely. Needs Review   ? Patient understands monitoring blood glucose, interpreting and using results Demonstrates understanding / competency   ? Patient understands prevention, detection, and treatment of acute complications. Demonstrates understanding / competency   ? Patient understands prevention, detection, and treatment of chronic complications. Demonstrates understanding / competency   ? Patient understands how to develop strategies to address psychosocial issues. Demonstrates understanding / competency   ? Patient understands how to develop strategies to promote health/change behavior. Demonstrates understanding / competency   ?  ?  Outcomes  ? Program Status Completed   ? ?  ?  ? ?  ? ? ?Learning Objective:  Patient will have a greater understanding of diabetes self-management. ?Patient education plan is to attend individual and/or group sessions per assessed needs and concerns. ? ?Additional Notes: ?Patient unable to use Dexcom monitor due to lack of receiver. Advised him to contact endo clinic; he is unable to use his phone as receiver because of poor wifi connectivity. ?Worked on carb counting using food reference lists and food labels. Advised goal of 45g each meal with max  of 60g. Instructed patient to keep food journal and estimate carb grams; will review by phone in 2-3 weeks. ? ?Plan:  ? ?Patient Instructions  ?Check food labels for carbs: start with the serving size, then check "Total Carb" . Adjust the carb grams to match the amount you actually eat.  ?Aim for 45-60 grams of carb with each meal. 15-25 with each snack.  ?Practice adding up carbs for meals and snacks, on paper. Send or bring in for review.  ? ? ?Expected Outcomes:  Demonstrated interest in learning. Expect positive outcomes ? ?Education material provided: Carb Acupuncturist (novo), food diary sheets ? ?If problems or questions, patient to contact team via:  Phone ? ?Future DSME appointment: -  prn ?

## 2021-06-27 DIAGNOSIS — I2510 Atherosclerotic heart disease of native coronary artery without angina pectoris: Secondary | ICD-10-CM | POA: Diagnosis not present

## 2021-06-27 DIAGNOSIS — E109 Type 1 diabetes mellitus without complications: Secondary | ICD-10-CM | POA: Diagnosis not present

## 2021-06-27 DIAGNOSIS — I5020 Unspecified systolic (congestive) heart failure: Secondary | ICD-10-CM | POA: Diagnosis not present

## 2021-07-09 ENCOUNTER — Telehealth: Payer: Self-pay | Admitting: Dietician

## 2021-07-09 NOTE — Telephone Encounter (Signed)
Called patient to check on his progress; he states he is practicing carb counting and doing well. Encouraged him to keep a record of foods and carb counts and bring in or send in for review.  ?He is working on getting a [place] CGM. ?Will contact patient for additional follow up after receiving food/ carb record. ?

## 2021-08-14 DIAGNOSIS — I2510 Atherosclerotic heart disease of native coronary artery without angina pectoris: Secondary | ICD-10-CM | POA: Diagnosis not present

## 2021-08-14 DIAGNOSIS — I5020 Unspecified systolic (congestive) heart failure: Secondary | ICD-10-CM | POA: Diagnosis not present

## 2021-08-14 DIAGNOSIS — M791 Myalgia, unspecified site: Secondary | ICD-10-CM | POA: Diagnosis not present

## 2021-08-14 DIAGNOSIS — E109 Type 1 diabetes mellitus without complications: Secondary | ICD-10-CM | POA: Diagnosis not present

## 2021-08-14 DIAGNOSIS — T466X5A Adverse effect of antihyperlipidemic and antiarteriosclerotic drugs, initial encounter: Secondary | ICD-10-CM | POA: Diagnosis not present

## 2021-08-21 DIAGNOSIS — M791 Myalgia, unspecified site: Secondary | ICD-10-CM | POA: Diagnosis not present

## 2021-08-21 DIAGNOSIS — E109 Type 1 diabetes mellitus without complications: Secondary | ICD-10-CM | POA: Diagnosis not present

## 2021-08-21 DIAGNOSIS — I779 Disorder of arteries and arterioles, unspecified: Secondary | ICD-10-CM | POA: Diagnosis not present

## 2021-08-21 DIAGNOSIS — Z0000 Encounter for general adult medical examination without abnormal findings: Secondary | ICD-10-CM | POA: Diagnosis not present

## 2021-08-21 DIAGNOSIS — I2510 Atherosclerotic heart disease of native coronary artery without angina pectoris: Secondary | ICD-10-CM | POA: Diagnosis not present

## 2021-08-21 DIAGNOSIS — T466X5A Adverse effect of antihyperlipidemic and antiarteriosclerotic drugs, initial encounter: Secondary | ICD-10-CM | POA: Diagnosis not present

## 2021-08-21 DIAGNOSIS — I5020 Unspecified systolic (congestive) heart failure: Secondary | ICD-10-CM | POA: Diagnosis not present

## 2021-08-22 DIAGNOSIS — E113292 Type 2 diabetes mellitus with mild nonproliferative diabetic retinopathy without macular edema, left eye: Secondary | ICD-10-CM | POA: Diagnosis not present

## 2021-08-22 DIAGNOSIS — Z9889 Other specified postprocedural states: Secondary | ICD-10-CM | POA: Diagnosis not present

## 2021-08-22 DIAGNOSIS — E113551 Type 2 diabetes mellitus with stable proliferative diabetic retinopathy, right eye: Secondary | ICD-10-CM | POA: Diagnosis not present

## 2021-08-22 DIAGNOSIS — H2513 Age-related nuclear cataract, bilateral: Secondary | ICD-10-CM | POA: Diagnosis not present

## 2022-02-04 ENCOUNTER — Encounter (INDEPENDENT_AMBULATORY_CARE_PROVIDER_SITE_OTHER): Payer: Self-pay

## 2022-02-26 ENCOUNTER — Other Ambulatory Visit (HOSPITAL_COMMUNITY): Payer: Self-pay

## 2022-05-13 DIAGNOSIS — E1059 Type 1 diabetes mellitus with other circulatory complications: Secondary | ICD-10-CM | POA: Diagnosis not present

## 2022-05-13 DIAGNOSIS — R232 Flushing: Secondary | ICD-10-CM | POA: Diagnosis not present

## 2022-05-16 DIAGNOSIS — E1059 Type 1 diabetes mellitus with other circulatory complications: Secondary | ICD-10-CM | POA: Diagnosis not present

## 2022-05-16 DIAGNOSIS — E10649 Type 1 diabetes mellitus with hypoglycemia without coma: Secondary | ICD-10-CM | POA: Diagnosis not present

## 2022-05-16 DIAGNOSIS — E103293 Type 1 diabetes mellitus with mild nonproliferative diabetic retinopathy without macular edema, bilateral: Secondary | ICD-10-CM | POA: Diagnosis not present

## 2022-07-31 DIAGNOSIS — I5020 Unspecified systolic (congestive) heart failure: Secondary | ICD-10-CM | POA: Diagnosis not present

## 2022-07-31 DIAGNOSIS — I779 Disorder of arteries and arterioles, unspecified: Secondary | ICD-10-CM | POA: Diagnosis not present

## 2022-07-31 DIAGNOSIS — I2510 Atherosclerotic heart disease of native coronary artery without angina pectoris: Secondary | ICD-10-CM | POA: Diagnosis not present

## 2022-07-31 DIAGNOSIS — I10 Essential (primary) hypertension: Secondary | ICD-10-CM | POA: Diagnosis not present

## 2022-08-19 DIAGNOSIS — I2510 Atherosclerotic heart disease of native coronary artery without angina pectoris: Secondary | ICD-10-CM | POA: Diagnosis not present

## 2022-08-19 DIAGNOSIS — E109 Type 1 diabetes mellitus without complications: Secondary | ICD-10-CM | POA: Diagnosis not present

## 2022-08-19 DIAGNOSIS — I10 Essential (primary) hypertension: Secondary | ICD-10-CM | POA: Diagnosis not present

## 2022-08-23 IMAGING — US US EXTREM LOW VENOUS*R*
1 series · 13 of 24 positions shown · non-contrast
Comparison: 03/03/2019

CLINICAL DATA: Right lower extremity edema.



[Series 1: us venous img lower uni right (dvt) · portal-venous · 13 of 39 slices shown]
[im 1/39]
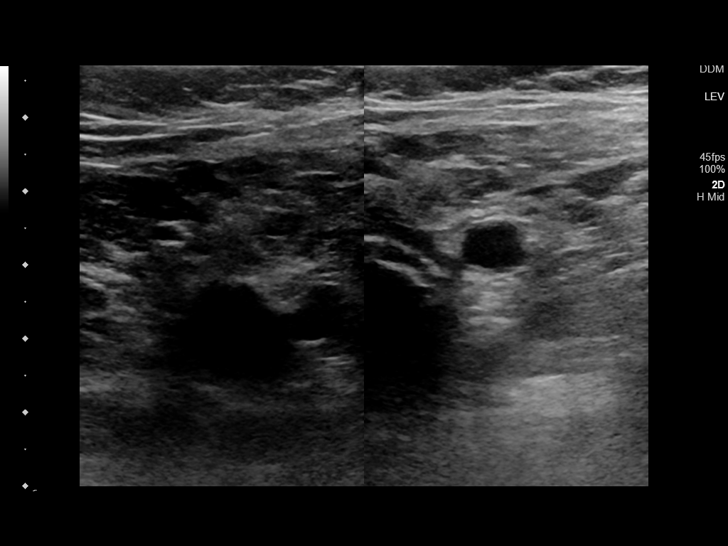
[im 4/39]
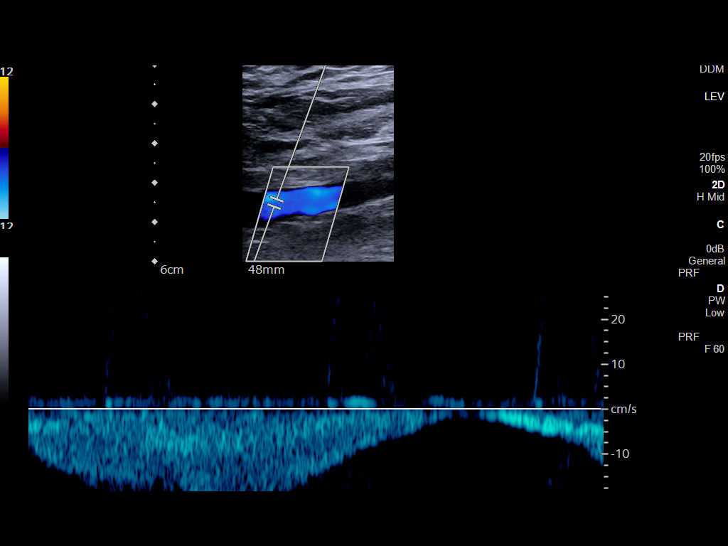
[im 7/39]
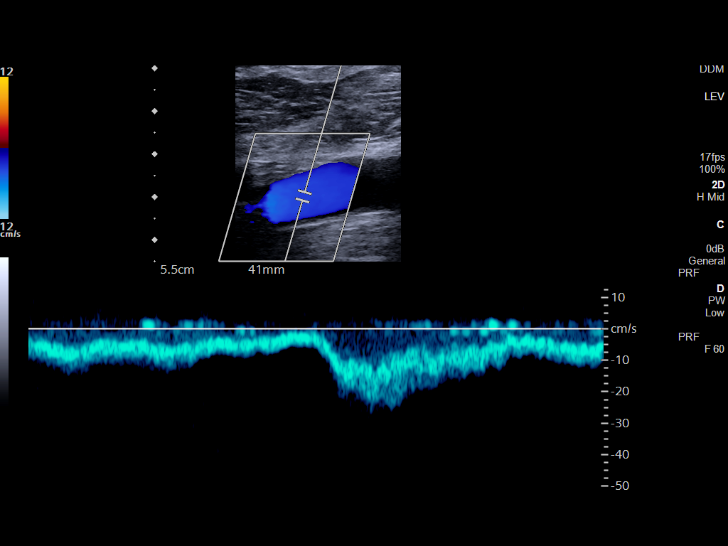
[im 10/39]
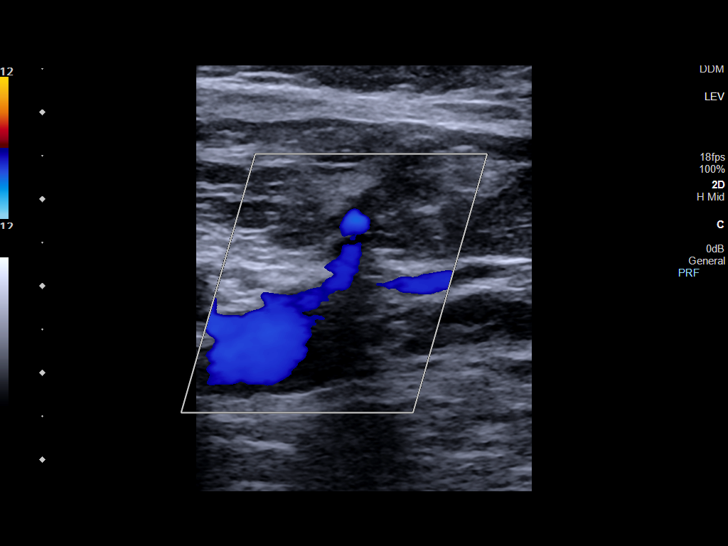
[im 14/39]
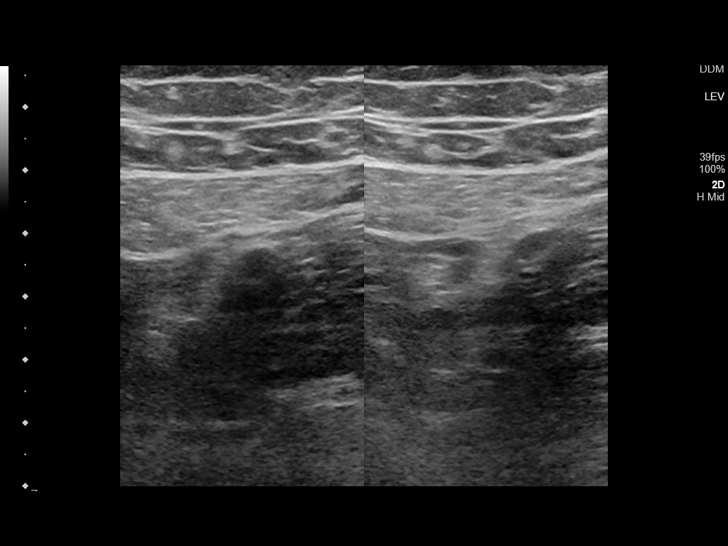
[im 17/39]
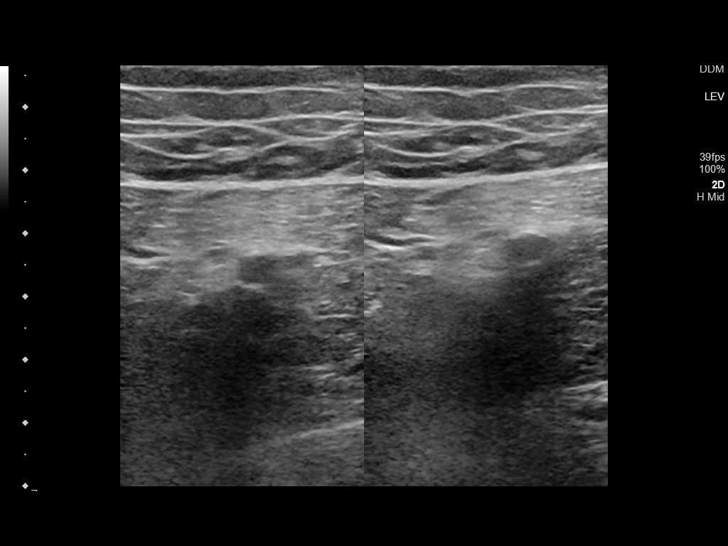
[im 20/39]
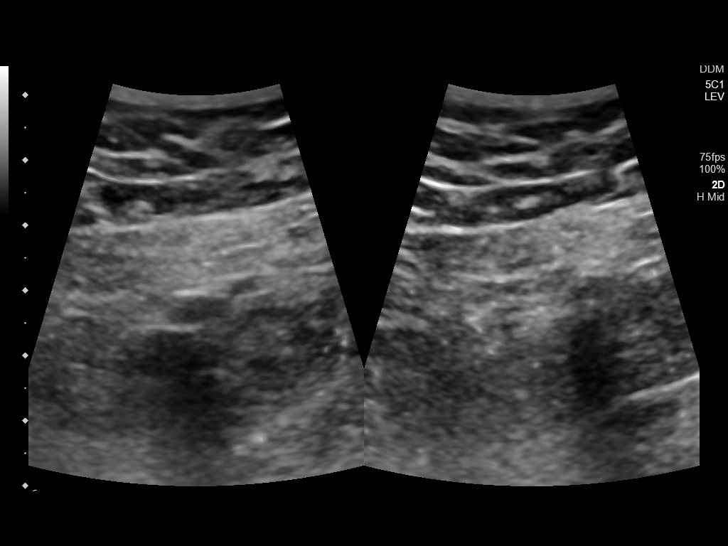
[im 22/39]
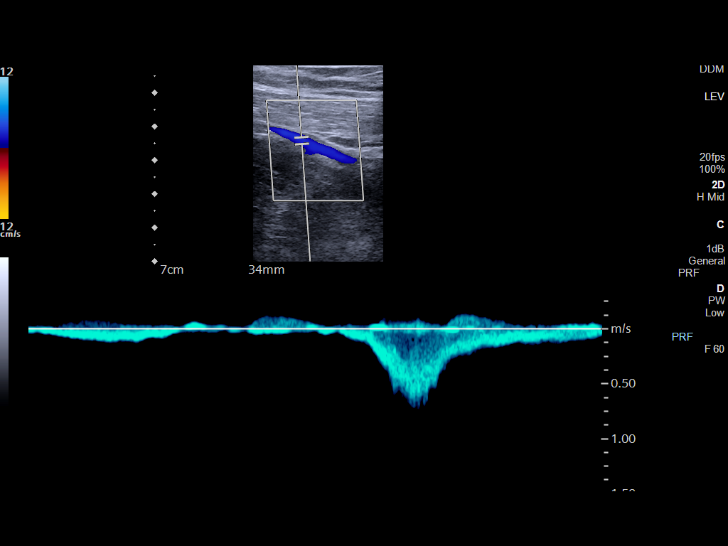
[im 25/39]
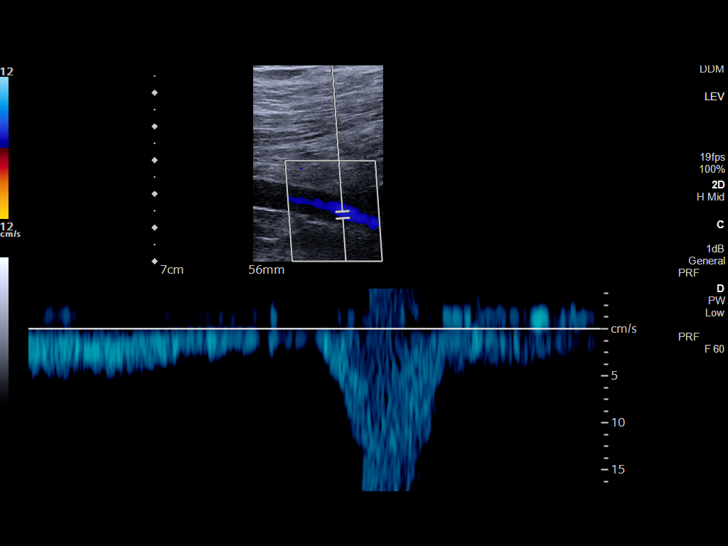
[im 29/39]
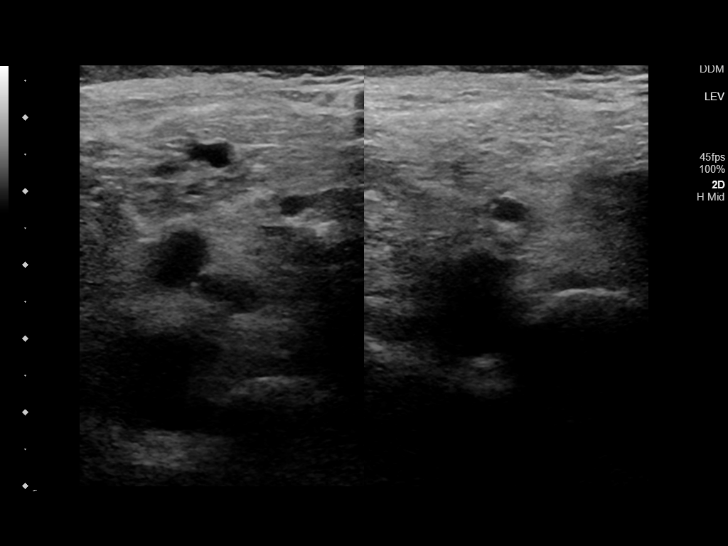
[im 32/39]
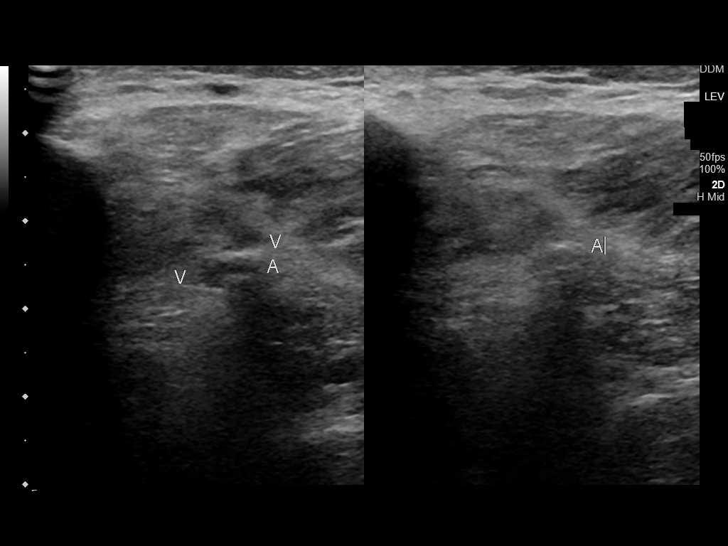
[im 35/39]
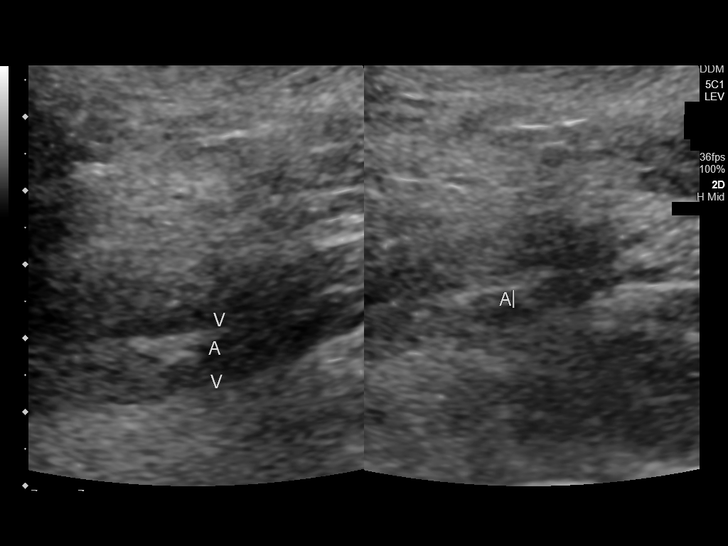
[im 39/39]
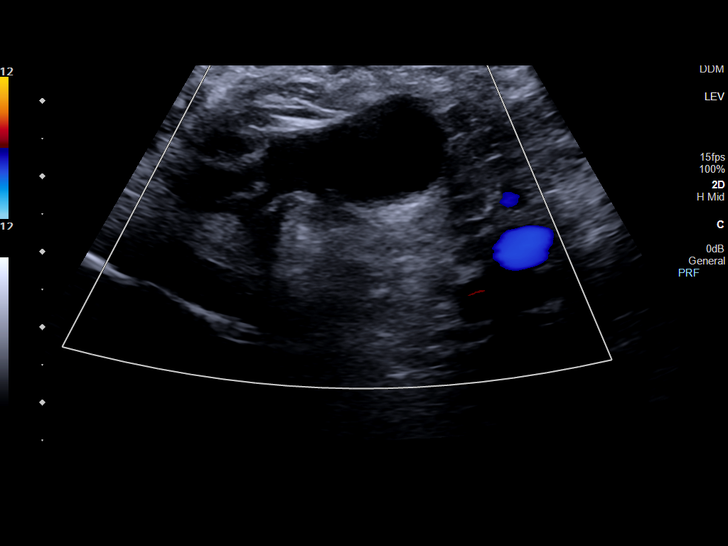

[13 of 24 positions shown; findings below may reference images not displayed]

FINDINGS: Contralateral Common Femoral Vein: Respiratory phasicity is normal
and symmetric with the symptomatic side. No evidence of thrombus.
Normal compressibility.

Common Femoral Vein: No evidence of thrombus. Normal
compressibility, respiratory phasicity and response to augmentation.

Saphenofemoral Junction: No evidence of thrombus. Normal
compressibility and flow on color Doppler imaging.

Profunda Femoral Vein: No evidence of thrombus. Normal
compressibility and flow on color Doppler imaging.

Femoral Vein: No evidence of thrombus. Normal compressibility,
respiratory phasicity and response to augmentation.

Popliteal Vein: No evidence of thrombus. Normal compressibility,
respiratory phasicity and response to augmentation.

Calf Veins: No evidence of thrombus. Normal compressibility and flow
on color Doppler imaging.

Superficial Great Saphenous Vein: No evidence of thrombus. Normal
compressibility.

Venous Reflux:  None.

Other Findings: No evidence of superficial thrombophlebitis.
Elongated fluid collection in the popliteal fossa is consistent with
a Baker's cyst and measures roughly 4.9 x 1.3 x 2.1 cm.
IMPRESSION: 1. No evidence of right lower extremity deep vein thrombosis.
2. Right popliteal fossa Baker's cyst.

## 2022-08-26 DIAGNOSIS — E109 Type 1 diabetes mellitus without complications: Secondary | ICD-10-CM | POA: Diagnosis not present

## 2022-08-26 DIAGNOSIS — Z0000 Encounter for general adult medical examination without abnormal findings: Secondary | ICD-10-CM | POA: Diagnosis not present

## 2022-08-26 DIAGNOSIS — I779 Disorder of arteries and arterioles, unspecified: Secondary | ICD-10-CM | POA: Diagnosis not present

## 2022-08-26 DIAGNOSIS — M791 Myalgia, unspecified site: Secondary | ICD-10-CM | POA: Diagnosis not present

## 2022-08-26 DIAGNOSIS — I10 Essential (primary) hypertension: Secondary | ICD-10-CM | POA: Diagnosis not present

## 2022-08-26 DIAGNOSIS — T466X5A Adverse effect of antihyperlipidemic and antiarteriosclerotic drugs, initial encounter: Secondary | ICD-10-CM | POA: Diagnosis not present

## 2022-08-26 DIAGNOSIS — E103293 Type 1 diabetes mellitus with mild nonproliferative diabetic retinopathy without macular edema, bilateral: Secondary | ICD-10-CM | POA: Diagnosis not present

## 2022-08-26 DIAGNOSIS — E1059 Type 1 diabetes mellitus with other circulatory complications: Secondary | ICD-10-CM | POA: Diagnosis not present

## 2022-08-26 DIAGNOSIS — I5020 Unspecified systolic (congestive) heart failure: Secondary | ICD-10-CM | POA: Diagnosis not present

## 2022-08-26 DIAGNOSIS — E10649 Type 1 diabetes mellitus with hypoglycemia without coma: Secondary | ICD-10-CM | POA: Diagnosis not present

## 2022-08-26 DIAGNOSIS — I2510 Atherosclerotic heart disease of native coronary artery without angina pectoris: Secondary | ICD-10-CM | POA: Diagnosis not present

## 2022-08-27 DIAGNOSIS — R1319 Other dysphagia: Secondary | ICD-10-CM | POA: Diagnosis not present

## 2022-08-27 DIAGNOSIS — Z1211 Encounter for screening for malignant neoplasm of colon: Secondary | ICD-10-CM | POA: Diagnosis not present

## 2022-11-22 ENCOUNTER — Emergency Department: Payer: PPO

## 2022-11-22 ENCOUNTER — Inpatient Hospital Stay
Admission: EM | Admit: 2022-11-22 | Discharge: 2022-11-28 | DRG: 480 | Disposition: A | Discharge status: Skilled Nursing Facility | Payer: PPO | Attending: Internal Medicine | Admitting: Internal Medicine

## 2022-11-22 ENCOUNTER — Inpatient Hospital Stay: Payer: PPO

## 2022-11-22 ENCOUNTER — Other Ambulatory Visit: Payer: Self-pay

## 2022-11-22 DIAGNOSIS — S72141D Displaced intertrochanteric fracture of right femur, subsequent encounter for closed fracture with routine healing: Secondary | ICD-10-CM | POA: Diagnosis not present

## 2022-11-22 DIAGNOSIS — I959 Hypotension, unspecified: Secondary | ICD-10-CM | POA: Diagnosis not present

## 2022-11-22 DIAGNOSIS — E7800 Pure hypercholesterolemia, unspecified: Secondary | ICD-10-CM | POA: Diagnosis not present

## 2022-11-22 DIAGNOSIS — A419 Sepsis, unspecified organism: Secondary | ICD-10-CM | POA: Diagnosis present

## 2022-11-22 DIAGNOSIS — M6281 Muscle weakness (generalized): Secondary | ICD-10-CM | POA: Diagnosis not present

## 2022-11-22 DIAGNOSIS — D649 Anemia, unspecified: Secondary | ICD-10-CM | POA: Diagnosis not present

## 2022-11-22 DIAGNOSIS — E119 Type 2 diabetes mellitus without complications: Secondary | ICD-10-CM

## 2022-11-22 DIAGNOSIS — I739 Peripheral vascular disease, unspecified: Secondary | ICD-10-CM | POA: Insufficient documentation

## 2022-11-22 DIAGNOSIS — I69398 Other sequelae of cerebral infarction: Secondary | ICD-10-CM | POA: Diagnosis not present

## 2022-11-22 DIAGNOSIS — N179 Acute kidney failure, unspecified: Secondary | ICD-10-CM | POA: Diagnosis not present

## 2022-11-22 DIAGNOSIS — R29898 Other symptoms and signs involving the musculoskeletal system: Principal | ICD-10-CM

## 2022-11-22 DIAGNOSIS — W01198A Fall on same level from slipping, tripping and stumbling with subsequent striking against other object, initial encounter: Secondary | ICD-10-CM | POA: Diagnosis present

## 2022-11-22 DIAGNOSIS — I69390 Apraxia following cerebral infarction: Secondary | ICD-10-CM | POA: Diagnosis not present

## 2022-11-22 DIAGNOSIS — R262 Difficulty in walking, not elsewhere classified: Secondary | ICD-10-CM | POA: Diagnosis not present

## 2022-11-22 DIAGNOSIS — S72001A Fracture of unspecified part of neck of right femur, initial encounter for closed fracture: Secondary | ICD-10-CM | POA: Diagnosis not present

## 2022-11-22 DIAGNOSIS — E1059 Type 1 diabetes mellitus with other circulatory complications: Secondary | ICD-10-CM

## 2022-11-22 DIAGNOSIS — Z951 Presence of aortocoronary bypass graft: Secondary | ICD-10-CM | POA: Diagnosis not present

## 2022-11-22 DIAGNOSIS — E785 Hyperlipidemia, unspecified: Secondary | ICD-10-CM | POA: Diagnosis not present

## 2022-11-22 DIAGNOSIS — Z880 Allergy status to penicillin: Secondary | ICD-10-CM

## 2022-11-22 DIAGNOSIS — D72825 Bandemia: Secondary | ICD-10-CM

## 2022-11-22 DIAGNOSIS — M160 Bilateral primary osteoarthritis of hip: Secondary | ICD-10-CM | POA: Diagnosis not present

## 2022-11-22 DIAGNOSIS — Z885 Allergy status to narcotic agent status: Secondary | ICD-10-CM | POA: Diagnosis not present

## 2022-11-22 DIAGNOSIS — E1051 Type 1 diabetes mellitus with diabetic peripheral angiopathy without gangrene: Secondary | ICD-10-CM | POA: Diagnosis not present

## 2022-11-22 DIAGNOSIS — D62 Acute posthemorrhagic anemia: Secondary | ICD-10-CM | POA: Diagnosis not present

## 2022-11-22 DIAGNOSIS — I700 Atherosclerosis of aorta: Secondary | ICD-10-CM | POA: Diagnosis not present

## 2022-11-22 DIAGNOSIS — S72121A Displaced fracture of lesser trochanter of right femur, initial encounter for closed fracture: Secondary | ICD-10-CM | POA: Diagnosis not present

## 2022-11-22 DIAGNOSIS — R2689 Other abnormalities of gait and mobility: Secondary | ICD-10-CM | POA: Diagnosis not present

## 2022-11-22 DIAGNOSIS — I2510 Atherosclerotic heart disease of native coronary artery without angina pectoris: Secondary | ICD-10-CM | POA: Diagnosis not present

## 2022-11-22 DIAGNOSIS — I5020 Unspecified systolic (congestive) heart failure: Secondary | ICD-10-CM | POA: Diagnosis not present

## 2022-11-22 DIAGNOSIS — R0689 Other abnormalities of breathing: Secondary | ICD-10-CM | POA: Diagnosis not present

## 2022-11-22 DIAGNOSIS — Z8249 Family history of ischemic heart disease and other diseases of the circulatory system: Secondary | ICD-10-CM | POA: Diagnosis not present

## 2022-11-22 DIAGNOSIS — F419 Anxiety disorder, unspecified: Secondary | ICD-10-CM | POA: Diagnosis present

## 2022-11-22 DIAGNOSIS — Z794 Long term (current) use of insulin: Secondary | ICD-10-CM

## 2022-11-22 DIAGNOSIS — I10 Essential (primary) hypertension: Secondary | ICD-10-CM | POA: Diagnosis not present

## 2022-11-22 DIAGNOSIS — R2681 Unsteadiness on feet: Secondary | ICD-10-CM | POA: Diagnosis not present

## 2022-11-22 DIAGNOSIS — E872 Acidosis, unspecified: Secondary | ICD-10-CM

## 2022-11-22 DIAGNOSIS — E10319 Type 1 diabetes mellitus with unspecified diabetic retinopathy without macular edema: Secondary | ICD-10-CM | POA: Diagnosis present

## 2022-11-22 DIAGNOSIS — E1042 Type 1 diabetes mellitus with diabetic polyneuropathy: Secondary | ICD-10-CM | POA: Diagnosis not present

## 2022-11-22 DIAGNOSIS — I639 Cerebral infarction, unspecified: Secondary | ICD-10-CM | POA: Diagnosis present

## 2022-11-22 DIAGNOSIS — E871 Hypo-osmolality and hyponatremia: Secondary | ICD-10-CM | POA: Diagnosis not present

## 2022-11-22 DIAGNOSIS — Z79899 Other long term (current) drug therapy: Secondary | ICD-10-CM

## 2022-11-22 DIAGNOSIS — Y92002 Bathroom of unspecified non-institutional (private) residence single-family (private) house as the place of occurrence of the external cause: Secondary | ICD-10-CM

## 2022-11-22 DIAGNOSIS — S7290XA Unspecified fracture of unspecified femur, initial encounter for closed fracture: Secondary | ICD-10-CM

## 2022-11-22 DIAGNOSIS — E109 Type 1 diabetes mellitus without complications: Secondary | ICD-10-CM | POA: Diagnosis present

## 2022-11-22 DIAGNOSIS — Z7982 Long term (current) use of aspirin: Secondary | ICD-10-CM

## 2022-11-22 DIAGNOSIS — S7291XA Unspecified fracture of right femur, initial encounter for closed fracture: Secondary | ICD-10-CM

## 2022-11-22 DIAGNOSIS — I110 Hypertensive heart disease with heart failure: Secondary | ICD-10-CM | POA: Diagnosis not present

## 2022-11-22 DIAGNOSIS — I5022 Chronic systolic (congestive) heart failure: Secondary | ICD-10-CM | POA: Diagnosis not present

## 2022-11-22 DIAGNOSIS — S72141A Displaced intertrochanteric fracture of right femur, initial encounter for closed fracture: Secondary | ICD-10-CM | POA: Diagnosis not present

## 2022-11-22 DIAGNOSIS — S728X1A Other fracture of right femur, initial encounter for closed fracture: Secondary | ICD-10-CM | POA: Diagnosis not present

## 2022-11-22 DIAGNOSIS — K449 Diaphragmatic hernia without obstruction or gangrene: Secondary | ICD-10-CM | POA: Diagnosis not present

## 2022-11-22 DIAGNOSIS — R531 Weakness: Secondary | ICD-10-CM | POA: Diagnosis not present

## 2022-11-22 DIAGNOSIS — R739 Hyperglycemia, unspecified: Secondary | ICD-10-CM | POA: Diagnosis not present

## 2022-11-22 DIAGNOSIS — R200 Anesthesia of skin: Secondary | ICD-10-CM | POA: Diagnosis not present

## 2022-11-22 DIAGNOSIS — D6869 Other thrombophilia: Secondary | ICD-10-CM | POA: Insufficient documentation

## 2022-11-22 DIAGNOSIS — E1010 Type 1 diabetes mellitus with ketoacidosis without coma: Secondary | ICD-10-CM | POA: Diagnosis not present

## 2022-11-22 DIAGNOSIS — Z87891 Personal history of nicotine dependence: Secondary | ICD-10-CM | POA: Diagnosis not present

## 2022-11-22 DIAGNOSIS — D72829 Elevated white blood cell count, unspecified: Secondary | ICD-10-CM

## 2022-11-22 DIAGNOSIS — R001 Bradycardia, unspecified: Secondary | ICD-10-CM | POA: Diagnosis not present

## 2022-11-22 DIAGNOSIS — I69351 Hemiplegia and hemiparesis following cerebral infarction affecting right dominant side: Secondary | ICD-10-CM | POA: Diagnosis not present

## 2022-11-22 DIAGNOSIS — Z9181 History of falling: Secondary | ICD-10-CM | POA: Diagnosis not present

## 2022-11-22 LAB — CBC
HCT: 32.6 % — ABNORMAL LOW (ref 39.0–52.0)
Hemoglobin: 9.9 g/dL — ABNORMAL LOW (ref 13.0–17.0)
MCH: 23.5 pg — ABNORMAL LOW (ref 26.0–34.0)
MCHC: 30.4 g/dL (ref 30.0–36.0)
MCV: 77.3 fL — ABNORMAL LOW (ref 80.0–100.0)
Platelets: 374 10*3/uL (ref 150–400)
RBC: 4.22 MIL/uL (ref 4.22–5.81)
RDW: 14.6 % (ref 11.5–15.5)
WBC: 22.4 10*3/uL — ABNORMAL HIGH (ref 4.0–10.5)
nRBC: 0 % (ref 0.0–0.2)

## 2022-11-22 LAB — BASIC METABOLIC PANEL
Anion gap: 15 (ref 5–15)
Anion gap: 10 (ref 5–15)
BUN: 20 mg/dL (ref 8–23)
BUN: 25 mg/dL — ABNORMAL HIGH (ref 8–23)
CO2: 12 mmol/L — ABNORMAL LOW (ref 22–32)
CO2: 21 mmol/L — ABNORMAL LOW (ref 22–32)
Calcium: 7.5 mg/dL — ABNORMAL LOW (ref 8.9–10.3)
Calcium: 8.4 mg/dL — ABNORMAL LOW (ref 8.9–10.3)
Chloride: 104 mmol/L (ref 98–111)
Chloride: 100 mmol/L (ref 98–111)
Creatinine, Ser: 1.02 mg/dL (ref 0.61–1.24)
Creatinine, Ser: 1.14 mg/dL (ref 0.61–1.24)
GFR, Estimated: >60 mL/min (ref >60)
GFR, Estimated: >60 mL/min (ref >60)
Glucose, Bld: 462 mg/dL — ABNORMAL HIGH (ref 70–99)
Glucose, Bld: 363 mg/dL — ABNORMAL HIGH (ref 70–99)
Potassium: 5.1 mmol/L (ref 3.5–5.1)
Potassium: 5 mmol/L (ref 3.5–5.1)
Sodium: 131 mmol/L — ABNORMAL LOW (ref 135–145)
Sodium: 131 mmol/L — ABNORMAL LOW (ref 135–145)

## 2022-11-22 LAB — SAMPLE TO BLOOD BANK

## 2022-11-22 LAB — HEMOGLOBIN A1C
Hgb A1c MFr Bld: 6.9 % — ABNORMAL HIGH (ref 4.8–5.6)
Mean Plasma Glucose: 151.33 mg/dL

## 2022-11-22 LAB — PROTIME-INR
INR: 1.1 (ref 0.8–1.2)
Prothrombin Time: 14.5 seconds (ref 11.4–15.2)

## 2022-11-22 LAB — CBG MONITORING, ED
Glucose-Capillary: 392 mg/dL — ABNORMAL HIGH (ref 70–99)
Glucose-Capillary: 405 mg/dL — ABNORMAL HIGH (ref 70–99)

## 2022-11-22 LAB — LACTIC ACID, PLASMA
Lactic Acid, Venous: 3.6 mmol/L (ref 0.5–1.9)
Lactic Acid, Venous: 7 mmol/L (ref 0.5–1.9)
Lactic Acid, Venous: 4.6 mmol/L (ref 0.5–1.9)
Lactic Acid, Venous: 3.3 mmol/L (ref 0.5–1.9)

## 2022-11-22 LAB — CK: Total CK: 102 U/L (ref 49–397)

## 2022-11-22 LAB — GLUCOSE, CAPILLARY: Glucose-Capillary: 329 mg/dL — ABNORMAL HIGH (ref 70–99)

## 2022-11-22 LAB — APTT: aPTT: 22 seconds — ABNORMAL LOW (ref 24–36)

## 2022-11-22 LAB — BETA-HYDROXYBUTYRIC ACID: Beta-Hydroxybutyric Acid: 1.29 mmol/L — ABNORMAL HIGH (ref 0.05–0.27)

## 2022-11-22 MED ORDER — SODIUM CHLORIDE 0.9 % IV BOLUS
1000.0000 mL | Freq: Once | INTRAVENOUS | Status: AC
  Administered 2022-11-22: 1000 mL via INTRAVENOUS

## 2022-11-22 MED ORDER — OXYCODONE HCL 5 MG PO TABS
5.0000 mg | ORAL_TABLET | Freq: Four times a day (QID) | ORAL | Status: DC | PRN
  Administered 2022-11-23: 10 mg via ORAL
  Filled 2022-11-22: qty 2

## 2022-11-22 MED ORDER — CHLORHEXIDINE GLUCONATE 4 % EX SOLN: Freq: Once | CUTANEOUS | Status: AC

## 2022-11-22 MED ORDER — TRAZODONE HCL 50 MG PO TABS
50.0000 mg | ORAL_TABLET | Freq: Every day | ORAL | Status: DC
  Administered 2022-11-22 – 2022-11-27 (×6): 50 mg via ORAL
  Filled 2022-11-22 (×6): qty 1

## 2022-11-22 MED ORDER — HEPARIN SODIUM (PORCINE) 5000 UNIT/ML IJ SOLN
5000.0000 [IU] | Freq: Three times a day (TID) | INTRAMUSCULAR | Status: AC
  Administered 2022-11-22: 5000 [IU] via SUBCUTANEOUS
  Filled 2022-11-22: qty 1

## 2022-11-22 MED ORDER — HYDROCODONE-ACETAMINOPHEN 5-325 MG PO TABS
1.0000 | ORAL_TABLET | ORAL | Status: DC | PRN
  Administered 2022-11-22 – 2022-11-23 (×2): 1 via ORAL
  Filled 2022-11-22 (×2): qty 1

## 2022-11-22 MED ORDER — GABAPENTIN 100 MG PO CAPS
200.0000 mg | ORAL_CAPSULE | Freq: Two times a day (BID) | ORAL | Status: DC
  Filled 2022-11-22: qty 2

## 2022-11-22 MED ORDER — HEPARIN BOLUS VIA INFUSION
5000.0000 [IU] | Freq: Once | INTRAVENOUS | Status: DC
  Filled 2022-11-22: qty 5000

## 2022-11-22 MED ORDER — LACTATED RINGERS IV BOLUS
1000.0000 mL | Freq: Once | INTRAVENOUS | Status: AC
  Administered 2022-11-22: 1000 mL via INTRAVENOUS

## 2022-11-22 MED ORDER — CELECOXIB 100 MG PO CAPS: 100.0000 mg | ORAL_CAPSULE | Freq: Two times a day (BID) | ORAL | Status: DC

## 2022-11-22 MED ORDER — CEFAZOLIN SODIUM-DEXTROSE 2-4 GM/100ML-% IV SOLN
2.0000 g | INTRAVENOUS | Status: AC
  Administered 2022-11-23: 2 g via INTRAVENOUS
  Filled 2022-11-22: qty 100

## 2022-11-22 MED ORDER — ACETAMINOPHEN 325 MG PO TABS
650.0000 mg | ORAL_TABLET | Freq: Four times a day (QID) | ORAL | Status: DC | PRN
  Administered 2022-11-23: 650 mg via ORAL

## 2022-11-22 MED ORDER — INSULIN ASPART 100 UNIT/ML IJ SOLN
0.0000 [IU] | INTRAMUSCULAR | Status: DC
  Administered 2022-11-22: 7 [IU] via SUBCUTANEOUS
  Administered 2022-11-22: 5 [IU] via SUBCUTANEOUS
  Administered 2022-11-23: 2 [IU] via SUBCUTANEOUS
  Administered 2022-11-23: 7 [IU] via SUBCUTANEOUS
  Administered 2022-11-23: 2 [IU] via SUBCUTANEOUS
  Administered 2022-11-23: 5 [IU] via SUBCUTANEOUS
  Administered 2022-11-23: 3 [IU] via SUBCUTANEOUS
  Administered 2022-11-24: 2 [IU] via SUBCUTANEOUS
  Administered 2022-11-24: 3 [IU] via SUBCUTANEOUS
  Filled 2022-11-22 (×9): qty 1

## 2022-11-22 MED ORDER — TRANEXAMIC ACID-NACL 1000-0.7 MG/100ML-% IV SOLN
1000.0000 mg | INTRAVENOUS | Status: AC
  Administered 2022-11-23: 1000 mg via INTRAVENOUS
  Filled 2022-11-22: qty 100

## 2022-11-22 MED ORDER — INSULIN GLARGINE-YFGN 100 UNIT/ML ~~LOC~~ SOLN
10.0000 [IU] | Freq: Every day | SUBCUTANEOUS | Status: DC
  Filled 2022-11-22: qty 0.1

## 2022-11-22 MED ORDER — LOSARTAN POTASSIUM 25 MG PO TABS
12.5000 mg | ORAL_TABLET | Freq: Every day | ORAL | Status: DC
  Administered 2022-11-23: 12.5 mg via ORAL
  Filled 2022-11-22: qty 1

## 2022-11-22 MED ORDER — IBUPROFEN 600 MG PO TABS: 600.0000 mg | ORAL_TABLET | Freq: Four times a day (QID) | ORAL | Status: DC | PRN

## 2022-11-22 MED ORDER — FENTANYL CITRATE PF 50 MCG/ML IJ SOSY
50.0000 ug | PREFILLED_SYRINGE | INTRAMUSCULAR | Status: DC | PRN
  Administered 2022-11-22: 50 ug via INTRAVENOUS
  Filled 2022-11-22: qty 1

## 2022-11-22 MED ORDER — METOPROLOL SUCCINATE ER 25 MG PO TB24
12.5000 mg | ORAL_TABLET | Freq: Every day | ORAL | Status: DC
  Administered 2022-11-22 – 2022-11-28 (×7): 12.5 mg via ORAL
  Filled 2022-11-22 (×7): qty 1

## 2022-11-22 MED ORDER — INSULIN ASPART 100 UNIT/ML IJ SOLN
10.0000 [IU] | Freq: Once | INTRAMUSCULAR | Status: AC
  Administered 2022-11-22: 10 [IU] via INTRAVENOUS
  Filled 2022-11-22: qty 1

## 2022-11-22 MED ORDER — HYDROMORPHONE HCL 1 MG/ML IJ SOLN: 0.5000 mg | INTRAMUSCULAR | Status: DC | PRN

## 2022-11-22 MED ORDER — VITAMIN B-12 1000 MCG PO TABS
1000.0000 ug | ORAL_TABLET | Freq: Every day | ORAL | Status: DC
  Administered 2022-11-23 – 2022-11-28 (×6): 1000 ug via ORAL
  Filled 2022-11-22 (×6): qty 1

## 2022-11-22 MED ORDER — ASPIRIN 81 MG PO TBEC: 81.0000 mg | DELAYED_RELEASE_TABLET | Freq: Every day | ORAL | Status: DC

## 2022-11-22 MED ORDER — IOHEXOL 350 MG/ML SOLN
100.0000 mL | Freq: Once | INTRAVENOUS | Status: AC | PRN
  Administered 2022-11-22: 80 mL via INTRAVENOUS

## 2022-11-22 MED ORDER — DULOXETINE HCL 30 MG PO CPEP
30.0000 mg | ORAL_CAPSULE | Freq: Every day | ORAL | Status: DC
  Administered 2022-11-22 – 2022-11-28 (×7): 30 mg via ORAL
  Filled 2022-11-22 (×7): qty 1

## 2022-11-22 MED ORDER — POLYETHYLENE GLYCOL 3350 17 G PO PACK: 17.0000 g | PACK | Freq: Every day | ORAL | Status: DC | PRN

## 2022-11-22 MED ORDER — LACTATED RINGERS IV SOLN: INTRAVENOUS | Status: AC

## 2022-11-22 MED ORDER — MELATONIN 5 MG PO TABS
10.0000 mg | ORAL_TABLET | Freq: Every day | ORAL | Status: DC
  Administered 2022-11-22 – 2022-11-27 (×6): 10 mg via ORAL
  Filled 2022-11-22 (×6): qty 2

## 2022-11-22 MED ORDER — HEPARIN (PORCINE) 25000 UT/250ML-% IV SOLN: 1450.0000 [IU]/h | INTRAVENOUS | Status: DC

## 2022-11-22 MED ORDER — INSULIN GLARGINE-YFGN 100 UNIT/ML ~~LOC~~ SOLN
7.0000 [IU] | Freq: Every day | SUBCUTANEOUS | Status: DC
  Administered 2022-11-22: 7 [IU] via SUBCUTANEOUS
  Filled 2022-11-22 (×2): qty 0.07

## 2022-11-22 MED ORDER — MORPHINE SULFATE (PF) 2 MG/ML IV SOLN: 1.0000 mg | INTRAVENOUS | Status: DC | PRN

## 2022-11-22 MED ORDER — SIMVASTATIN 10 MG PO TABS
5.0000 mg | ORAL_TABLET | Freq: Every evening | ORAL | Status: DC
  Administered 2022-11-23: 5 mg via ORAL
  Filled 2022-11-22 (×3): qty 1
  Filled 2022-11-22: qty 0.5
  Filled 2022-11-22: qty 1

## 2022-11-22 MED ORDER — SODIUM CHLORIDE 0.9 % IV SOLN: INTRAVENOUS | Status: DC

## 2022-11-22 NOTE — Assessment & Plan Note (Addendum)
On admission, patient was noted to be acidotic with bicarb of 12 and elevated anion gap at 15.  Initial lactic acid elevated at 3.6 with increase to 7.0 despite 1 L fluids.  Unclear etiology at this time as patient declines any acute symptoms other than leg cramps.  Besides lactic acid, mild DKA is also contributing but BHA is only minimally elevated.  - S/p 1 L bolus - Additional 1 L bolus followed by 125 cc/h - Repeat lactic acid in 3 hours - CK pending - Given right leg numbness/cramps his only symptoms at this time, will obtain a CTA runoff to evaluate for any vascular compromise - SSI every 4 hours - N.p.o.

## 2022-11-22 NOTE — ED Notes (Addendum)
Pt family being disrespectful to this RN, calling this RN names to other staff. This RN will no longer be participating in direct pt care. Charge RN aware.

## 2022-11-22 NOTE — ED Notes (Signed)
Pt exceeding visitor policy again. Has been instructed on visitor policy multiple times. Charge RN aware.

## 2022-11-22 NOTE — Assessment & Plan Note (Signed)
-   Continue home aspirin, statin and as needed nitro

## 2022-11-22 NOTE — ED Provider Notes (Signed)
[facility] Provider Note   Event Date/Time   First MD Initiated Contact with Patient 11/22/22 1249     (approximate) History  Weakness  HPI Bruce Lucas is a 62 y.o. male with significant medical history of ACS, CVA, and type 1 diabetes who presents complaining of weakness to the right lower extremity.  Patient states that he does not have any deficits from his previous strokes.  Patient denies any recent trauma.  Patient states that he has a sensation of "cramping" in the right lower extremity and EMS noted this right lower extremity to be cooler than the left.  Patient only complains of cramping in the right lower extremity and denies any pain at the ankle, knee, or hip.  Patient does state that he has been unable to bear weight on this right leg since 5 AM this morning. ROS: Patient currently denies any vision changes, tinnitus, difficulty speaking, facial droop, sore throat, chest pain, shortness of breath, abdominal pain, nausea/vomiting/diarrhea, dysuria, or numbness/paresthesias in any extremity   Physical Exam  Triage Vital Signs: ED Triage Vitals  Encounter Vitals Group     BP 11/22/22 1242 (!) 133/55     Systolic BP Percentile --      Diastolic BP Percentile --      Pulse Rate 11/22/22 1242 80     Resp 11/22/22 1242 18     Temp --      Temp src --      SpO2 11/22/22 1242 99 %     Weight 11/22/22 1240 200 lb (90.7 kg)     Height 11/22/22 1240 5\' 11"  (1.803 m)     Head Circumference --      Peak Flow --      Pain Score 11/22/22 1239 3     Pain Loc --      Pain Education --      Exclude from Growth Chart --    Most recent vital signs: Vitals:   11/22/22 1251 11/22/22 1500  BP:  (!) 144/59  Pulse:  99  Resp:  (!) 23  Temp: 98.3 F (36.8 C)   SpO2:  100%   General: Awake, oriented x4. CV:  Good peripheral perfusion.  Resp:  Normal effort.  Abd:  No distention.  Other:  Middle-aged overweight Caucasian male resting comfortably in no  acute distress.  Bedside color-flow ultrasound shows absent PT and DP color change on the right lower extremity. ED Results / Procedures / Treatments  Labs (all labs ordered are listed, but only abnormal results are displayed) Labs Reviewed  CBC - Abnormal; Notable for the following components:      Result Value   WBC 22.4 (*)    Hemoglobin 9.9 (*)    HCT 32.6 (*)    MCV 77.3 (*)    MCH 23.5 (*)    All other components within normal limits  BASIC METABOLIC PANEL - Abnormal; Notable for the following components:   Sodium 131 (*)    CO2 12 (*)    Glucose, Bld 462 (*)    Calcium 7.5 (*)    All other components within normal limits  LACTIC ACID, PLASMA - Abnormal; Notable for the following components:   Lactic Acid, Venous 3.6 (*)    All other components within normal limits  APTT - Abnormal; Notable for the following components:   aPTT 22 (*)    All other components within normal limits  CBG MONITORING, ED - Abnormal; Notable for  the following components:   Glucose-Capillary 392 (*)    All other components within normal limits  PROTIME-INR  LACTIC ACID, PLASMA  BETA-HYDROXYBUTYRIC ACID  SAMPLE TO BLOOD BANK   EKG ED ECG REPORT I, Merwyn Katos, the attending physician, personally viewed and interpreted this ECG. Date: 11/22/2022 EKG Time: 1251 Rate: 85 Rhythm: normal sinus rhythm QRS Axis: normal Intervals: normal ST/T Wave abnormalities: normal Narrative Interpretation: no evidence of acute ischemia RADIOLOGY ED MD interpretation: One-view portable chest x-ray interpreted by me shows no evidence of acute abnormalities including no pneumonia, pneumothorax, or widened mediastinum \ X-ray of the right hip interpreted independently by me and shows a comminuted intertrochanteric proximal right femur fracture -Agree with radiology assessment Official radiology report(s): DG Chest 1 View  Result Date: 11/22/2022 CLINICAL DATA:  Right hip fracture, weakness EXAM: CHEST  1  VIEW COMPARISON:  09/13/2018 chest radiograph. FINDINGS: Intact sternotomy wires. CABG clips overlie the left mediastinum. Stable cardiomediastinal silhouette with normal heart size. No pneumothorax. No pleural effusion. Lungs appear clear, with no acute consolidative airspace disease and no pulmonary edema. IMPRESSION: No active disease. Electronically Signed   By: Delbert Phenix M.D.   On: 11/22/2022 15:15   DG Hip Unilat With Pelvis 2-3 Views Right  Result Date: 11/22/2022 CLINICAL DATA:  Right lower extremity pain, numbness and weakness EXAM: DG HIP (WITH OR WITHOUT PELVIS) 2-3V RIGHT COMPARISON:  None Available. FINDINGS: Comminuted intertrochanteric proximal right femur fracture with 2 cm overriding of the fracture fragments and 1 cm medial displacement of the lesser trochanter fracture fragment. No dislocation at the right hip joint. No pelvic diastasis. Mild bilateral hip osteoarthritis. No suspicious focal osseous lesions. Mild degenerative changes in the visualized lower lumbar spine. Surgical clip overlies the medial upper right thigh soft tissues. IMPRESSION: 1. Comminuted intertrochanteric proximal right femur fracture as detailed. 2. Mild bilateral hip osteoarthritis. Electronically Signed   By: Delbert Phenix M.D.   On: 11/22/2022 15:14   PROCEDURES: Critical Care performed: No .1-3 Lead EKG Interpretation  Performed by: Merwyn Katos, MD Authorized by: Merwyn Katos, MD     Interpretation: normal     ECG rate:  71   ECG rate assessment: normal     Rhythm: sinus rhythm     Ectopy: none     Conduction: normal    MEDICATIONS ORDERED IN ED: Medications  fentaNYL (SUBLIMAZE) injection 50 mcg (50 mcg Intravenous Given 11/22/22 1526)  ibuprofen (ADVIL) tablet 600 mg (has no administration in time range)  insulin aspart (novoLOG) injection 10 Units (10 Units Intravenous Given 11/22/22 1407)  sodium chloride 0.9 % bolus 1,000 mL (0 mLs Intravenous Stopped 11/22/22 1513)   IMPRESSION /  MDM / ASSESSMENT AND PLAN / ED COURSE  I reviewed the triage vital signs and the nursing notes.                             The patient is on the cardiac monitor to evaluate for evidence of arrhythmia and/or significant heart rate changes. Patient's presentation is most consistent with acute presentation with potential threat to life or bodily function. Patient is a 62 year old male that presents for right lower extremity weakness and cramping Color-flow Doppler of the right DP and PT pulses did not show color change and therefore vascular was called.  Dr. Gilda Crease agreed to see and assess patient who was able to find pulses on Doppler.  Also noted patient to have  pain with range of motion at the right hip Workup: XR hip Findings: Right femur fracture without dislocation Consult: Orthopedic Surgery (Dr. Hyacinth Meeker), hospitalist  Patient does not currently demonstrate complications of fracture such as compartment syndrome, arterial or nerve injury.  Interventions: analgesia Disposition: Admit   FINAL CLINICAL IMPRESSION(S) / ED DIAGNOSES   Final diagnoses:  Weakness of right lower extremity  Closed fracture of right femur, unspecified fracture morphology, initial encounter (HCC)   Rx / DC Orders   ED Discharge Orders     None      Note:  This document was prepared using Dragon voice recognition software and may include unintentional dictation errors.   Merwyn Katos, MD 11/22/22 [phone]

## 2022-11-22 NOTE — ED Notes (Signed)
Lab contacted to add on beta

## 2022-11-22 NOTE — Assessment & Plan Note (Signed)
-   Continue home regimen 

## 2022-11-22 NOTE — Inpatient Diabetes Management (Signed)
Inpatient Diabetes Program Recommendations  AACE/ADA: New Consensus Statement on Inpatient Glycemic Control (2015)  Target Ranges:  Prepandial:   less than 140 mg/dL      Peak postprandial:   less than 180 mg/dL (1-2 hours)      Critically ill patients:  140 - 180 mg/dL    Latest Reference Range & Units 11/22/22 12:44  CO2 22 - 32 mmol/L 12 (L)  Glucose 70 - 99 mg/dL 161 (H)  Anion gap 5 - 15  15   Review of Glycemic Control  Diabetes history: DM1 (does NOT make any insulin; requires basal, correction, and carb coverage insulin) Outpatient Diabetes medications: Lantus 19 units at bedtime, Novolog 8-14 TID Current orders for Inpatient glycemic control: Novolog 10 units x1 at 13:30  Inpatient Diabetes Program Recommendations:    Insulin: Noted lab glucose 462 mg/dl, CO2 12, anion gap 15 today at 12:44. Please consider ordering beta-hydroxybutyric acid and if in DKA, patient will need IV insulin.  NOTE: In ED via EMS with right leg weakness and per RN note at 12:44, MD not able to locate pulse on right foot.  Sees Dr. Tedd Sias; last seen 08/26/22 and per office note prescribed Lantus 15 units at bedtime, Fiasp 4-9 units with breakfast, Fiasp 6-14 units with lunch and supper;  A1C 7%.    Thanks, Orlando Penner, RN, MSN, CDCES Diabetes Coordinator Inpatient Diabetes Program [phone] (Team Pager from 8am to 5pm)

## 2022-11-22 NOTE — ED Notes (Signed)
Report to Matt RN.

## 2022-11-22 NOTE — ED Notes (Signed)
It was brought to the attention of this charge RN that pt's family member (pt's S/O-[name]) was making rude comments to the primary RN Brianna-calling RN "nurse ratchet to another RN at bedside" at the time of arrival to the pt's room it was discussed with family (2 at bedside) members that it would be best if S/O did not come back to pt's room in the ED due to upsetting the RN taking care of pt. -pt also agreed with this.Pt and family members encouraged to wait til pt came upstairs to have [name] visit pt due to disrespect of staff in the ED, all family verbalized understanding including pt.    This charge RN had already spoken to family prior due to request due to "nurse not being kind" and visitor policy. This charge RN did not witness any encounters that deferred from Icare standards and family was made aware of 2 visitor policy while pt in treatment room.   Admission MD Dr. Huel Cote asked to come to speak to pt about diet orders and pts plan of care.

## 2022-11-22 NOTE — Assessment & Plan Note (Signed)
Likely reactive in setting of femur fracture.  - Repeat CBC in the a.m. - Hold off on any antimicrobial therapy given low suspicion for infection

## 2022-11-22 NOTE — Assessment & Plan Note (Signed)
No evidence of hypervolemia at this time to suggest acute exacerbation  - Daily weights - Strict in and out

## 2022-11-22 NOTE — ED Notes (Signed)
Dr Huel Cote notified of cbg 405. Orders to be placed as needed.

## 2022-11-22 NOTE — ED Triage Notes (Signed)
Pt to ED [facility] from home for right leg weakness started at 0530 this am. No swelling or redness. Right leg cooler than left. Reports cramping sensation to right leg.  Cbg 549 PTA, 500 NS PTA. 20g to left hand.  Alert and oriented.

## 2022-11-22 NOTE — Consult Note (Signed)
ORTHOPAEDIC CONSULTATION  REQUESTING PHYSICIAN: Verdene Lennert, MD  Chief Complaint: Right hip pain  HPI: Bruce Lucas is a 62 y.o. male who complains of right hip pain after a fall off the commode at home this morning.  Patient was brought to the emergency room where exam and x-rays revealed a comminuted intertrochanteric fracture of the right hip.  He was admitted to the hospitalist service for medical evaluation and stabilization.  There was concern about his lactic acid being elevated and he currently is undergoing a angiogram of the lower extremities.  He is known to have severe peripheral vascular disease.  If he is cleared for surgery we will plan to do this tomorrow morning.  Eyes spoken to 2 of the patient's daughters who are present and explained the diagnosis and treatment plan to them.  I will go over this with the patient as well later.  Past Medical History:  Diagnosis Date   Coronary artery disease    Diabetes mellitus (HCC)    Type 1   Diabetic peripheral neuropathy (HCC)    Diabetic retinopathy (HCC)    Hyperlipidemia    Hypertension    Stroke (HCC)    06/2018   Past Surgical History:  Procedure Laterality Date   CARDIAC SURGERY     CORONARY ARTERY BYPASS GRAFT     09/03/18   EYE SURGERY     Per patient, for diabetic retinopathy, right eye   KNEE SURGERY Bilateral    TONSILLECTOMY     Social History   Socioeconomic History   Marital status: Divorced    Spouse name: Not on file   Number of children: Not on file   Years of education: Not on file   Highest education level: Not on file  Occupational History   Not on file  Tobacco Use   Smoking status: Former    Current packs/day: 0.00    Average packs/day: 0.5 packs/day for 10.0 years (5.0 ttl pk-yrs)    Types: Cigarettes    Start date: 04/08/2008    Quit date: 04/08/2018    Years since quitting: 4.6   Smokeless tobacco: Never   Tobacco comments:    At work  Vaping Use   Vaping status: Never Used   Substance and Sexual Activity   Alcohol use: No   Drug use: No   Sexual activity: Not on file  Other Topics Concern   Not on file  Social History Narrative   Not on file   Social Determinants of Health   Financial Resource Strain: Low Risk  (08/26/2022)   Received from [facility], [facility]   Overall Financial Resource Strain (CARDIA)    Difficulty of Paying Living Expenses: Not hard at all  Food Insecurity: No Food Insecurity (08/26/2022)   Received from [facility], [facility]   Hunger Vital Sign    Worried About Running Out of Food in the Last Year: Never true    Ran Out of Food in the Last Year: Never true  Transportation Needs: No Transportation Needs (08/26/2022)   Received from [facility], [facility]   [facility] - Transportation    In the past 12 months, has lack of transportation kept you from medical appointments or from getting medications?: No    Lack of Transportation (Non-Medical): No  Physical Activity: Not on file  Stress: Not on file  Social Connections: Unknown (08/17/2021)   Received from [facility]   Social Network    Social Network: Not on file   Family History  Problem Relation Age of Onset   Brain cancer Mother    Hypertension Father    Allergies  Allergen Reactions   Codeine    Penicillins    Prior to Admission medications   Medication Sig Start Date End Date Taking? Authorizing Provider  aspirin EC 81 MG tablet Take 81 mg by mouth daily.   Yes [provider]  cyanocobalamin 1000 MCG tablet Take 1 tablet by mouth daily.   Yes [provider]  DULoxetine (CYMBALTA) 30 MG capsule Take 30 mg by mouth daily. 08/26/22  Yes [provider]  FIASP FLEXTOUCH 100 UNIT/ML FlexTouch Pen Inject 0-50 Units into the skin 4 (four) times daily.   Yes [provider]  Insulin Glargine (LANTUS SOLOSTAR)  100 UNIT/ML Solostar Pen Inject 14 Units into the skin at bedtime.   Yes [name], MD  losartan (COZAAR) 25 MG tablet Take 12.5 mg by mouth daily. 04/20/21  Yes [provider]  Melatonin 10 MG TABS Take 10 mg by mouth at bedtime.   Yes [provider]  metoprolol succinate (TOPROL-XL) 25 MG 24 hr tablet Take 12.5 mg by mouth daily.   Yes [provider]  simvastatin (ZOCOR) 5 MG tablet Take 5 mg by mouth daily.   Yes [provider]  traZODone (DESYREL) 50 MG tablet Take 50 mg by mouth at bedtime.   Yes [provider]  nitroGLYCERIN (NITROSTAT) 0.4 MG SL tablet Place 0.4 mg under the tongue every 5 (five) minutes x 3 doses as needed for chest pain. Patient not taking: Reported on 11/22/2022 07/13/18   [provider]   DG Chest 1 View  Result Date: 11/22/2022 CLINICAL DATA:  Right hip fracture, weakness EXAM: CHEST  1 VIEW COMPARISON:  09/13/2018 chest radiograph. FINDINGS: Intact sternotomy wires. CABG clips overlie the left mediastinum. Stable cardiomediastinal silhouette with normal heart size. No pneumothorax. No pleural effusion. Lungs appear clear, with no acute consolidative airspace disease and no pulmonary edema. IMPRESSION: No active disease. Electronically Signed   By: Delbert Phenix M.D.   On: 11/22/2022 15:15   DG Hip Unilat With Pelvis 2-3 Views Right  Result Date: 11/22/2022 CLINICAL DATA:  Right lower extremity pain, numbness and weakness EXAM: DG HIP (WITH OR WITHOUT PELVIS) 2-3V RIGHT COMPARISON:  None Available. FINDINGS: Comminuted intertrochanteric proximal right femur fracture with 2 cm overriding of the fracture fragments and 1 cm medial displacement of the lesser trochanter fracture fragment. No dislocation at the right hip joint. No pelvic diastasis. Mild bilateral hip osteoarthritis. No suspicious focal osseous lesions. Mild degenerative changes in the visualized lower lumbar spine. Surgical clip overlies the  medial upper right thigh soft tissues. IMPRESSION: 1. Comminuted intertrochanteric proximal right femur fracture as detailed. 2. Mild bilateral hip osteoarthritis. Electronically Signed   By: Delbert Phenix M.D.   On: 11/22/2022 15:14    Positive ROS: All other systems have been reviewed and were otherwise negative with the exception of those mentioned in the HPI and as above.  Physical Exam: General: Alert, no acute distress Cardiovascular: No pedal edema Respiratory: No cyanosis, no use of accessory musculature GI: No organomegaly, abdomen is soft and non-tender Skin: No lesions in the area of chief complaint Neurologic: Sensation intact distally Psychiatric: Patient is competent for consent with normal mood and affect Lymphatic: No axillary or cervical lymphadenopathy  MUSCULOSKELETAL: Patient is currently in vascular  suite and cannot be examined I will examine him fully at a later time.  Assessment: Comminuted displaced intertrochanteric fracture right hip  Plan: Right hip nailing when cleared by the medical service    Valinda Hoar, MD [phone]   11/22/2022 6:24 PM

## 2022-11-22 NOTE — Assessment & Plan Note (Addendum)
Patient has a history of well-controlled type 1 diabetes with last A1c of 7.0%.  He is on Lantus 19 units at bedtime in addition to sliding scale NovoLog.   - Hold home antiglycemic agents - Lantus 10 units at bedtime - SSI q4h with transition to SSI with meals after improvement in acidosis

## 2022-11-22 NOTE — Progress Notes (Signed)
       CROSS COVER NOTE  NAME: RAYHAN ABRAHAMS MRN: [id number] DOB : February 04, 1961    Concern as stated by nurse / staff    Hello, I just need to let you know that this pt refused his gabapentin because he says he stopped taking it at home, thanks    Pertinent findings on chart review:   Assessment and  Interventions   Assessment:  Plan: Gabapentin discontinued       Donnie Mesa NP [facility] Cross Cover 7pm-7am - check amion for availability Pager [phone]

## 2022-11-22 NOTE — Anesthesia Preprocedure Evaluation (Signed)
Anesthesia Evaluation  Patient identified by MRN, date of birth, ID band Patient awake  General Assessment Comment:TEMPLATED NOTE< INCOMPLETE  Reviewed: Allergy & Precautions, H&P , NPO status , Patient's Chart, lab work & pertinent test results, reviewed documented beta blocker date and time   Airway Mallampati: III  TM Distance: >3 FB Neck ROM: full  Mouth opening: Limited Mouth Opening  Dental no notable dental hx. (+)    Pulmonary former smoker   Pulmonary exam normal        Cardiovascular METS (>4): hypertension, Pt. on home beta blockers and Pt. 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  The overall quality of the study is good.   Artifacts noted: no  Left ventricular cavity: normal.  Perfusion Analysis:  SPECT images demonstrate small perfusion abnormality  of mild intensity is present in the apical myocardium region on the stress  images as well as resting images. 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 Residual mild  right-sided weakness. 5/5 strength in gross motor groups RUE this morning), Residual Symptoms  negative psych ROS   GI/Hepatic Neg liver ROS,,,Lower esophageal dysphagia   Endo/Other  diabetes, Type 1, Insulin Dependent    Renal/GU      Musculoskeletal   Abdominal Normal abdominal exam  (+)   Peds  Hematology  (+) Blood dyscrasia, anemia   Anesthesia Other Findings DKA on admission with resolution overnight.  Past Medical History: No date: Coronary artery disease No date: Diabetes mellitus (HCC)     Comment:  Type 1 No date: Diabetic peripheral neuropathy (HCC) No date: Diabetic retinopathy (HCC) No date: Hyperlipidemia No date: Hypertension No date: Stroke [facility])     Comment:  06/2018  Past Surgical History: No date: CARDIAC SURGERY No date: CORONARY ARTERY BYPASS GRAFT     Comment:  09/03/18 No date: EYE SURGERY     Comment:  Per patient, for diabetic retinopathy, right eye No date: KNEE SURGERY; Bilateral No date: TONSILLECTOMY  BMI    Body Mass Index: 27.89 kg/m      Reproductive/Obstetrics negative OB ROS                             Anesthesia Physical Anesthesia Plan  ASA: 3  Anesthesia Plan: General ETT   Post-op Pain Management: Tylenol PO (pre-op)* and Precedex   Induction: Intravenous  PONV Risk Score and Plan: 2 and Ondansetron, Dexamethasone and Midazolam  Airway Management Planned: Oral ETT  Additional Equipment:   Intra-op Plan:   Post-operative Plan: Extubation in OR  Informed Consent: I have reviewed the patients History and Physical, chart, labs and  discussed the procedure including the risks, benefits and alternatives for the proposed anesthesia with the patient or authorized representative who has indicated his/her understanding and acceptance.     Dental advisory given  Plan Discussed with: CRNA and Anesthesiologist  Anesthesia Plan Comments:         Anesthesia Quick Evaluation

## 2022-11-22 NOTE — Assessment & Plan Note (Addendum)
Right hip fracture Status post IM nailing, patient seen postoperatively Dressing clean and dry

## 2022-11-22 NOTE — ED Notes (Signed)
Pt in XRAy

## 2022-11-22 NOTE — ED Notes (Signed)
Dr Vicente Males attempting to find pulses to right foot with [place]. Unable to find pulses at this time

## 2022-11-22 NOTE — ED Notes (Signed)
Pt given urinal.

## 2022-11-22 NOTE — ED Notes (Signed)
Per Dr Huel Cote, pt to receive 5 units novolog SQ.  Repeat lactic after current bolus finished and started on maintenance fluids.

## 2022-11-22 NOTE — ED Notes (Signed)
Pt family out to nurses desk again, making charge RN aware that she feels he needs ativan and is unable to communicate his needs adequately. Pt denies need for ativan or pain meds. Is alert and oriented. Dr Vicente Males aware

## 2022-11-22 NOTE — Consult Note (Deleted)
ANTICOAGULATION CONSULT NOTE   Pharmacy Consult for heparin infusion Indication: arterial occlusion/limb ischemia  Allergies  Allergen Reactions   Codeine    Penicillins     Patient Measurements: Height: 5\' 11"  (180.3 cm) Weight: 90.7 kg (200 lb) IBW/kg (Calculated) : 75.3 Heparin Dosing Weight: 90.7 kg  Vital Signs: Temp: 98.3 F (36.8 C) (08/16 1251) BP: 133/55 (08/16 1242) Pulse Rate: 80 (08/16 1242)  Labs: No results for input(s): "HGB", "HCT", "PLT", "APTT", "LABPROT", "INR", "HEPARINUNFRC", "HEPRLOWMOCWT", "CREATININE", "CKTOTAL", "CKMB", "TROPONINIHS" in the last 72 hours.  CrCl cannot be calculated (Patient's most recent lab result is older than the maximum 21 days allowed.).   Medical History: Past Medical History:  Diagnosis Date   Coronary artery disease    Diabetes mellitus (HCC)    Type 1   Diabetic peripheral neuropathy (HCC)    Diabetic retinopathy (HCC)    Hyperlipidemia    Hypertension    Stroke (HCC)    06/2018    Medications:  No home anticoagulants per pharmacist review.  Assessment: 62 yo male presenting to the ED due to right leg weakness.  Also reported that right lower extremity is cooler than left.  Pharmacy consulted to initiate heparin infusion for possible arterial occlusion.  Baseline labs: hgb 9.9, hct 32.6, plt 374, aPTT pending, INR pending  Goal of Therapy:  Heparin level 0.3-0.7 units/ml Monitor platelets by anticoagulation protocol: Yes   Plan:  Give 5000 units bolus x 1 Start heparin infusion at 1450 units/hr Check anti-Xa level in 6 hours and daily while on heparin Continue to monitor H&H and platelets  Barrie Folk, PharmD 11/22/2022,1:01 PM

## 2022-11-22 NOTE — ED Notes (Signed)
Pt family educated on visitor policy multiple times.  Pt family out to nursing desk asking for ice water multiple times, RN explained will give ice water as soon as able.

## 2022-11-22 NOTE — H&P (Addendum)
History and Physical    Patient: Bruce Lucas FAO:[id number] DOB: 08/07/1960 DOA: 11/22/2022 DOS: the patient was seen and examined on 11/22/2022 PCP: [name], MD  Patient coming from: Home  Chief Complaint:  Chief Complaint  Patient presents with   Weakness   HPI: Bruce Lucas is a 62 y.o. male with medical history significant of type 1 diabetes, HFrEF, hypertension, CVA with right-sided residual deficits, CAD s/p CABG (2020), who presents to the ED due to right leg weakness.  [name] states that he was in his usual state of health this morning at approximately 6 AM when he was getting up to use the restroom.  He often falls asleep in his recliner and his right leg goes to sleep and he has to drag himself to the restroom and that is what he was doing this morning.  He was able to get up on the commode but when he went to go stand up, his leg gave out and he fell onto the floor, hitting the commode on the way down.  He was able to crawl back to his recliner but notes severe right leg cramps and pain.  Due to this, EMS was called.  He denies any nausea, vomiting, chest pain, shortness of breath, abdominal pain.  He notes that he is feeling very anxious though.  Patient's family and girlfriend at bedside states that he has had some apraxia and anxiety since his stroke several years ago.  ED course: On arrival to the ED, patient was normotensive at 133/55 with heart rate of 92.  She was saturating at 99% on room air.  She was afebrile at 98.3. Initial workup notable for WBC of 22.4, hemoglobin of 9.9, MCV of 77, potassium 5.1, bicarb 12, anion gap of 15, BUN 20, creatinine 1.02 with GFR above 60, glucose of 462.  Hip x-ray was obtained that demonstrated right femoral fracture.  Orthopedic surgery consulted.  Chest x-ray was obtained with results pending.  TRH contacted for admission.  Review of Systems: As mentioned in the history of present illness. All other systems  reviewed and are negative.  Past Medical History:  Diagnosis Date   Coronary artery disease    Diabetes mellitus (HCC)    Type 1   Diabetic peripheral neuropathy (HCC)    Diabetic retinopathy (HCC)    Hyperlipidemia    Hypertension    Stroke (HCC)    06/2018   Past Surgical History:  Procedure Laterality Date   CARDIAC SURGERY     CORONARY ARTERY BYPASS GRAFT     09/03/18   EYE SURGERY     Per patient, for diabetic retinopathy, right eye   KNEE SURGERY Bilateral    TONSILLECTOMY     Social History:  reports that he quit smoking about 4 years ago. His smoking use included cigarettes. He started smoking about 14 years ago. He has a 5 pack-year smoking history. He has never used smokeless tobacco. He reports that he does not drink alcohol and does not use drugs.  Allergies  Allergen Reactions   Codeine    Penicillins     Family History  Problem Relation Age of Onset   Brain cancer Mother    Hypertension Father     Prior to Admission medications   Medication Sig Start Date End Date Taking? Authorizing Provider  aspirin EC 81 MG tablet Take 81 mg by mouth daily.    [provider]  cyanocobalamin 1000 MCG tablet Take 1 tablet  by mouth daily.    [provider]  gabapentin (NEURONTIN) 300 MG capsule Take 1 capsule (300 mg total) by mouth 2 (two) times daily. 08/22/20     insulin aspart (NOVOLOG) 100 UNIT/ML FlexPen Inject 8-14 Units into the skin 3 (three) times daily before meals. 02/13/21   [provider]  Insulin Glargine (LANTUS SOLOSTAR) 100 UNIT/ML Solostar Pen Inject 19 Units into the skin at bedtime.    [name], MD  losartan (COZAAR) 25 MG tablet Take 12.5 mg by mouth daily. 04/20/21   [provider]  Melatonin 10 MG TABS Take 10 mg by mouth at bedtime.    [provider]  metoprolol succinate (TOPROL-XL) 100 MG 24 hr tablet TAKE 1/2 TABLET (50MG ) BY MOUTH ONCE DAILY 06/07/20 06/07/21  [name], MD   nitroGLYCERIN (NITROSTAT) 0.4 MG SL tablet Place 0.4 mg under the tongue every 5 (five) minutes x 3 doses as needed for chest pain. 07/13/18   [provider]  sertraline (ZOLOFT) 100 MG tablet Take 100 mg by mouth daily.    [provider]  simvastatin (ZOCOR) 5 MG tablet Take 5 mg by mouth daily.    [provider]  traZODone (DESYREL) 50 MG tablet Take 50 mg by mouth at bedtime.    [provider]    Physical Exam: Vitals:   11/22/22 1240 11/22/22 1242 11/22/22 1251 11/22/22 1500  BP:  (!) 133/55  (!) 144/59  Pulse:  80  99  Resp:  18  (!) 23  Temp:   98.3 F (36.8 C)   SpO2:  99%  100%  Weight: 90.7 kg     Height: 5\' 11"  (1.803 m)      Physical Exam Vitals and nursing note reviewed.  Constitutional:      General: He is not in acute distress.    Appearance: He is obese.  HENT:     Head: Normocephalic and atraumatic.     Mouth/Throat:     Mouth: Mucous membranes are moist.     Pharynx: Oropharynx is clear.  Eyes:     Conjunctiva/sclera: Conjunctivae normal.     Pupils: Pupils are equal, round, and reactive to light.  Cardiovascular:     Rate and Rhythm: Normal rate and regular rhythm.     Heart sounds: No murmur heard.    No gallop.  Pulmonary:     Effort: Pulmonary effort is normal. No respiratory distress.     Breath sounds: Normal breath sounds. No wheezing, rhonchi or rales.  Abdominal:     General: Bowel sounds are normal. There is no distension.     Palpations: Abdomen is soft.     Tenderness: There is no abdominal tenderness. There is no guarding.  Musculoskeletal:     Right lower leg: No edema.     Left lower leg: No edema.     Comments: Right lower extremity shortened and externally rotated. Diminished pulses compared to the left.   Skin:    General: Skin is warm and dry.  Neurological:     General: No focal deficit present.     Mental Status: He is alert. Mental status is at baseline.     Motor: Weakness (Right leg  weakness, acute on chronic given fracture) present.  Psychiatric:        Mood and Affect: Mood normal.        Behavior: Behavior normal.     Comments: Anxious    Data Reviewed: CBC with  WBC of 22.4, hemoglobin 9.9, MCV of 77.3 and platelets of 374 BMP with sodium of 131, potassium 5.1, bicarb 12, glucose 462, BUN 20, creatinine 1.02, anion gap 15 and GFR above 60 INR 1.1 PTT 22  EKG personally reviewed.  Sinus rhythm with rate of 85.  Nonspecific J-point elevation in lateral leads, but otherwise no acute ischemic changes.  Chest x-ray was personally reviewed.  Clear throughout with no evidence of infiltrates or pleural effusions.  Sternotomy wires present.  Hip x-ray personally reviewed.  Right-sided femoral fracture, both intratrochanteric and seems to involve the femoral neck  Results are pending, will review when available.  Assessment and Plan:  * Femoral fracture [facility]) Patient is presenting after a ground-level fall when trying to get up from the commode with x-ray evidence of a right femoral fracture.  - Orthopedic surgery consulted; appreciate their recommendations - Plan for the OR tomorrow - N.p.o. after midnight - Pain control with Tylenol, oxycodone and Dilaudid  Metabolic acidosis On admission, patient was noted to be acidotic with bicarb of 12 and elevated anion gap at 15.  Initial lactic acid elevated at 3.6 with increase to 7.0 despite 1 L fluids.  Unclear etiology at this time as patient declines any acute symptoms other than leg cramps.  Besides lactic acid, mild DKA is also contributing but BHA is only minimally elevated.  - S/p 1 L bolus - Additional 1 L bolus followed by 125 cc/h - Repeat lactic acid in 3 hours - CK pending - Given right leg numbness/cramps his only symptoms at this time, will obtain a CTA runoff to evaluate for any vascular compromise - SSI every 4 hours - N.p.o.  Leukocytosis Likely reactive in setting of femur fracture.  - Repeat  CBC in the a.m. - Hold off on any antimicrobial therapy given low suspicion for infection  Type 1 diabetes (HCC) Patient has a history of well-controlled type 1 diabetes with last A1c of 7.0%.  He is on Lantus 19 units at bedtime in addition to sliding scale NovoLog.   - Hold home antiglycemic agents - Lantus 10 units at bedtime - SSI q4h with transition to SSI with meals after improvement in acidosis  HFrEF (heart failure with reduced ejection fraction) (HCC) No evidence of hypervolemia at this time to suggest acute exacerbation  - Daily weights - Strict in and out  Essential hypertension - Continue home regimen  Coronary artery disease involving native coronary artery of native heart without angina pectoris - Continue home aspirin, statin and as needed nitro  Advance Care Planning:   Code Status: Full Code   Consults: Orthopedic surgery  Family Communication: Patient's daughters and girlfriend updated at bedside  Severity of Illness: The appropriate patient status for this patient is INPATIENT. Inpatient status is judged to be reasonable and necessary in order to provide the required intensity of service to ensure the patient's safety. The patient's presenting symptoms, physical exam findings, and initial radiographic and laboratory data in the context of their chronic comorbidities is felt to place them at high risk for further clinical deterioration. Furthermore, it is not anticipated that the patient will be medically stable for discharge from the hospital within 2 midnights of admission.   * I certify that at the point of admission it is my clinical judgment that the patient will require inpatient hospital care spanning beyond 2 midnights from the point of admission due to high intensity of service, high risk for further deterioration and high frequency of surveillance  required.*  Author: Verdene Lennert, MD 11/22/2022 5:18 PM  For on call review [email or web address].

## 2022-11-22 NOTE — ED Notes (Signed)
Daughters at bedside  

## 2022-11-22 NOTE — ED Notes (Signed)
Pt family asking for pt to have ativan, pt declines wanting ativan, states does not need anything.  Pt in NAD. Denies needs

## 2022-11-23 ENCOUNTER — Encounter
Admission: EM | Disposition: A | Discharge status: Skilled Nursing Facility | Payer: Self-pay | Source: Home / Self Care | Attending: Obstetrics and Gynecology

## 2022-11-23 ENCOUNTER — Inpatient Hospital Stay: Payer: PPO

## 2022-11-23 ENCOUNTER — Inpatient Hospital Stay: Payer: PPO | Admitting: Anesthesiology

## 2022-11-23 DIAGNOSIS — N179 Acute kidney failure, unspecified: Secondary | ICD-10-CM

## 2022-11-23 DIAGNOSIS — A419 Sepsis, unspecified organism: Secondary | ICD-10-CM

## 2022-11-23 DIAGNOSIS — E871 Hypo-osmolality and hyponatremia: Secondary | ICD-10-CM | POA: Diagnosis present

## 2022-11-23 DIAGNOSIS — I739 Peripheral vascular disease, unspecified: Secondary | ICD-10-CM | POA: Insufficient documentation

## 2022-11-23 DIAGNOSIS — E1051 Type 1 diabetes mellitus with diabetic peripheral angiopathy without gangrene: Secondary | ICD-10-CM | POA: Diagnosis not present

## 2022-11-23 DIAGNOSIS — D649 Anemia, unspecified: Secondary | ICD-10-CM | POA: Insufficient documentation

## 2022-11-23 DIAGNOSIS — D6869 Other thrombophilia: Secondary | ICD-10-CM | POA: Insufficient documentation

## 2022-11-23 DIAGNOSIS — E119 Type 2 diabetes mellitus without complications: Secondary | ICD-10-CM

## 2022-11-23 DIAGNOSIS — E109 Type 1 diabetes mellitus without complications: Secondary | ICD-10-CM | POA: Diagnosis present

## 2022-11-23 HISTORY — DX: Sepsis, unspecified organism: A41.9

## 2022-11-23 HISTORY — DX: Hypo-osmolality and hyponatremia: E87.1

## 2022-11-23 HISTORY — PX: INTRAMEDULLARY (IM) NAIL INTERTROCHANTERIC: SHX5875

## 2022-11-23 HISTORY — DX: Acute kidney failure, unspecified: N17.9

## 2022-11-23 LAB — GLUCOSE, CAPILLARY
Glucose-Capillary: 139 mg/dL — ABNORMAL HIGH (ref 70–99)
Glucose-Capillary: 160 mg/dL — ABNORMAL HIGH (ref 70–99)
Glucose-Capillary: 194 mg/dL — ABNORMAL HIGH (ref 70–99)
Glucose-Capillary: 198 mg/dL — ABNORMAL HIGH (ref 70–99)
Glucose-Capillary: 213 mg/dL — ABNORMAL HIGH (ref 70–99)
Glucose-Capillary: 216 mg/dL — ABNORMAL HIGH (ref 70–99)
Glucose-Capillary: 271 mg/dL — ABNORMAL HIGH (ref 70–99)
Glucose-Capillary: 305 mg/dL — ABNORMAL HIGH (ref 70–99)
Glucose-Capillary: 322 mg/dL — ABNORMAL HIGH (ref 70–99)

## 2022-11-23 LAB — CBC
HCT: 25 % — ABNORMAL LOW (ref 39.0–52.0)
Hemoglobin: 8.2 g/dL — ABNORMAL LOW (ref 13.0–17.0)
MCH: 23.8 pg — ABNORMAL LOW (ref 26.0–34.0)
MCHC: 32.8 g/dL (ref 30.0–36.0)
MCV: 72.7 fL — ABNORMAL LOW (ref 80.0–100.0)
Platelets: 289 10*3/uL (ref 150–400)
RBC: 3.44 MIL/uL — ABNORMAL LOW (ref 4.22–5.81)
RDW: 14.6 % (ref 11.5–15.5)
WBC: 12.4 10*3/uL — ABNORMAL HIGH (ref 4.0–10.5)
nRBC: 0 % (ref 0.0–0.2)

## 2022-11-23 LAB — COMPREHENSIVE METABOLIC PANEL
ALT: 20 U/L (ref 0–44)
AST: 63 U/L — ABNORMAL HIGH (ref 15–41)
Albumin: 2.9 g/dL — ABNORMAL LOW (ref 3.5–5.0)
Alkaline Phosphatase: 61 U/L (ref 38–126)
Anion gap: 5 (ref 5–15)
BUN: 25 mg/dL — ABNORMAL HIGH (ref 8–23)
CO2: 24 mmol/L (ref 22–32)
Calcium: 8.2 mg/dL — ABNORMAL LOW (ref 8.9–10.3)
Chloride: 104 mmol/L (ref 98–111)
Creatinine, Ser: 0.89 mg/dL (ref 0.61–1.24)
GFR, Estimated: >60 mL/min (ref >60)
Glucose, Bld: 213 mg/dL — ABNORMAL HIGH (ref 70–99)
Potassium: 4.3 mmol/L (ref 3.5–5.1)
Sodium: 133 mmol/L — ABNORMAL LOW (ref 135–145)
Total Bilirubin: 1.2 mg/dL (ref 0.3–1.2)
Total Protein: 5.2 g/dL — ABNORMAL LOW (ref 6.5–8.1)

## 2022-11-23 LAB — HIV ANTIBODY (ROUTINE TESTING W REFLEX): HIV Screen 4th Generation wRfx: NONREACTIVE

## 2022-11-23 LAB — ABO/RH: ABO/RH(D): AB POS

## 2022-11-23 LAB — PREPARE RBC (CROSSMATCH)

## 2022-11-23 SURGERY — FIXATION, FRACTURE, INTERTROCHANTERIC, WITH INTRAMEDULLARY ROD
Anesthesia: General | Laterality: Right

## 2022-11-23 MED ORDER — PHENYLEPHRINE HCL-NACL 20-0.9 MG/250ML-% IV SOLN
INTRAVENOUS | Status: AC
  Filled 2022-11-23: qty 250

## 2022-11-23 MED ORDER — HYDROCODONE-ACETAMINOPHEN 7.5-325 MG PO TABS
1.0000 | ORAL_TABLET | ORAL | Status: DC | PRN
  Administered 2022-11-24: 2 via ORAL
  Administered 2022-11-26: 1 via ORAL
  Filled 2022-11-23: qty 1
  Filled 2022-11-23 (×2): qty 2

## 2022-11-23 MED ORDER — SUGAMMADEX SODIUM 200 MG/2ML IV SOLN
INTRAVENOUS | Status: DC | PRN
  Administered 2022-11-23: 200 mg via INTRAVENOUS

## 2022-11-23 MED ORDER — BISACODYL 10 MG RE SUPP: 10.0000 mg | Freq: Every day | RECTAL | Status: DC | PRN

## 2022-11-23 MED ORDER — SODIUM CHLORIDE 0.9 % IV SOLN
2.0000 g | Freq: Three times a day (TID) | INTRAVENOUS | Status: DC
  Administered 2022-11-23 – 2022-11-24 (×3): 2 g via INTRAVENOUS
  Filled 2022-11-23 (×4): qty 12.5

## 2022-11-23 MED ORDER — DROPERIDOL 2.5 MG/ML IJ SOLN: 0.6250 mg | Freq: Once | INTRAMUSCULAR | Status: DC | PRN

## 2022-11-23 MED ORDER — METOCLOPRAMIDE HCL 10 MG PO TABS: 5.0000 mg | ORAL_TABLET | Freq: Three times a day (TID) | ORAL | Status: DC | PRN

## 2022-11-23 MED ORDER — ACETAMINOPHEN 325 MG PO TABS: 325.0000 mg | ORAL_TABLET | Freq: Four times a day (QID) | ORAL | Status: DC | PRN

## 2022-11-23 MED ORDER — INSULIN ASPART 100 UNIT/ML IJ SOLN
2.0000 [IU] | Freq: Three times a day (TID) | INTRAMUSCULAR | Status: DC
  Administered 2022-11-24: 2 [IU] via SUBCUTANEOUS
  Filled 2022-11-23: qty 1

## 2022-11-23 MED ORDER — GENTAMICIN SULFATE 40 MG/ML IJ SOLN
INTRAMUSCULAR | Status: DC | PRN
  Administered 2022-11-23: 80 mg

## 2022-11-23 MED ORDER — FENTANYL CITRATE (PF) 100 MCG/2ML IJ SOLN: 25.0000 ug | INTRAMUSCULAR | Status: DC | PRN

## 2022-11-23 MED ORDER — FENTANYL CITRATE (PF) 100 MCG/2ML IJ SOLN
INTRAMUSCULAR | Status: DC | PRN
  Administered 2022-11-23 (×2): 50 ug via INTRAVENOUS

## 2022-11-23 MED ORDER — CEFAZOLIN SODIUM-DEXTROSE 2-4 GM/100ML-% IV SOLN: 2.0000 g | Freq: Three times a day (TID) | INTRAVENOUS | Status: DC

## 2022-11-23 MED ORDER — ONDANSETRON HCL 4 MG/2ML IJ SOLN: 4.0000 mg | Freq: Four times a day (QID) | INTRAMUSCULAR | Status: DC | PRN

## 2022-11-23 MED ORDER — MAGNESIUM HYDROXIDE 400 MG/5ML PO SUSP
30.0000 mL | Freq: Every day | ORAL | Status: DC | PRN
  Administered 2022-11-27: 30 mL via ORAL
  Filled 2022-11-23: qty 30

## 2022-11-23 MED ORDER — ONDANSETRON HCL 4 MG/2ML IJ SOLN
INTRAMUSCULAR | Status: DC | PRN
Start: 2022-11-23 — End: 2022-11-23
  Administered 2022-11-23: 4 mg via INTRAVENOUS

## 2022-11-23 MED ORDER — OXYCODONE HCL 5 MG/5ML PO SOLN: 5.0000 mg | Freq: Once | ORAL | Status: DC | PRN

## 2022-11-23 MED ORDER — MORPHINE SULFATE (PF) 2 MG/ML IV SOLN: 0.5000 mg | INTRAVENOUS | Status: DC | PRN

## 2022-11-23 MED ORDER — ADULT MULTIVITAMIN W/MINERALS CH
1.0000 | ORAL_TABLET | Freq: Every day | ORAL | Status: DC
  Administered 2022-11-24 – 2022-11-28 (×5): 1 via ORAL
  Filled 2022-11-23 (×5): qty 1

## 2022-11-23 MED ORDER — MENTHOL 3 MG MT LOZG: 1.0000 | LOZENGE | OROMUCOSAL | Status: DC | PRN

## 2022-11-23 MED ORDER — SODIUM CHLORIDE 0.45 % IV SOLN: INTRAVENOUS | Status: DC

## 2022-11-23 MED ORDER — INSULIN GLARGINE-YFGN 100 UNIT/ML ~~LOC~~ SOLN
10.0000 [IU] | Freq: Every day | SUBCUTANEOUS | Status: DC
  Administered 2022-11-23: 10 [IU] via SUBCUTANEOUS
  Filled 2022-11-23: qty 0.1

## 2022-11-23 MED ORDER — SODIUM CHLORIDE 0.9 % IV SOLN: 2.0000 g | Freq: Once | INTRAVENOUS | Status: DC

## 2022-11-23 MED ORDER — ENSURE ENLIVE PO LIQD
237.0000 mL | Freq: Three times a day (TID) | ORAL | Status: DC
  Administered 2022-11-24 (×2): 237 mL via ORAL

## 2022-11-23 MED ORDER — SENNA 8.6 MG PO TABS
1.0000 | ORAL_TABLET | Freq: Two times a day (BID) | ORAL | Status: DC
  Administered 2022-11-23 – 2022-11-28 (×9): 8.6 mg via ORAL
  Filled 2022-11-23 (×10): qty 1

## 2022-11-23 MED ORDER — FLEET ENEMA RE ENEM: 1.0000 | ENEMA | Freq: Once | RECTAL | Status: DC | PRN

## 2022-11-23 MED ORDER — ACETAMINOPHEN 325 MG PO TABS: 650.0000 mg | ORAL_TABLET | Freq: Once | ORAL | Status: DC

## 2022-11-23 MED ORDER — METOCLOPRAMIDE HCL 5 MG/ML IJ SOLN: 5.0000 mg | Freq: Three times a day (TID) | INTRAMUSCULAR | Status: DC | PRN

## 2022-11-23 MED ORDER — FENTANYL CITRATE (PF) 100 MCG/2ML IJ SOLN
INTRAMUSCULAR | Status: AC
  Filled 2022-11-23: qty 2

## 2022-11-23 MED ORDER — ROCURONIUM BROMIDE 10 MG/ML (PF) SYRINGE
PREFILLED_SYRINGE | INTRAVENOUS | Status: AC
  Filled 2022-11-23: qty 10

## 2022-11-23 MED ORDER — ATORVASTATIN CALCIUM 20 MG PO TABS
40.0000 mg | ORAL_TABLET | Freq: Every day | ORAL | Status: DC
  Administered 2022-11-23 – 2022-11-28 (×6): 40 mg via ORAL
  Filled 2022-11-23 (×6): qty 2

## 2022-11-23 MED ORDER — LIDOCAINE HCL (PF) 2 % IJ SOLN
INTRAMUSCULAR | Status: AC
  Filled 2022-11-23: qty 5

## 2022-11-23 MED ORDER — TRANEXAMIC ACID-NACL 1000-0.7 MG/100ML-% IV SOLN
1000.0000 mg | Freq: Once | INTRAVENOUS | Status: AC
  Administered 2022-11-23: 1000 mg via INTRAVENOUS

## 2022-11-23 MED ORDER — PHENYLEPHRINE HCL-NACL 20-0.9 MG/250ML-% IV SOLN
INTRAVENOUS | Status: DC | PRN
Start: 2022-11-23 — End: 2022-11-23
  Administered 2022-11-23: 30 ug/min via INTRAVENOUS

## 2022-11-23 MED ORDER — PHENOL 1.4 % MT LIQD: 1.0000 | OROMUCOSAL | Status: DC | PRN

## 2022-11-23 MED ORDER — HYDROCODONE-ACETAMINOPHEN 5-325 MG PO TABS
1.0000 | ORAL_TABLET | ORAL | Status: DC | PRN
  Administered 2022-11-23: 2 via ORAL
  Administered 2022-11-23: 1 via ORAL
  Administered 2022-11-24 – 2022-11-25 (×2): 2 via ORAL
  Administered 2022-11-26: 1 via ORAL
  Filled 2022-11-23 (×3): qty 2
  Filled 2022-11-23 (×2): qty 1

## 2022-11-23 MED ORDER — ONDANSETRON HCL 4 MG PO TABS: 4.0000 mg | ORAL_TABLET | Freq: Four times a day (QID) | ORAL | Status: DC | PRN

## 2022-11-23 MED ORDER — ACETAMINOPHEN 325 MG PO TABS
ORAL_TABLET | ORAL | Status: AC
  Filled 2022-11-23: qty 2

## 2022-11-23 MED ORDER — PHENYLEPHRINE HCL (PRESSORS) 10 MG/ML IV SOLN
INTRAVENOUS | Status: DC | PRN
  Administered 2022-11-23 (×4): 160 ug via INTRAVENOUS

## 2022-11-23 MED ORDER — PROPOFOL 10 MG/ML IV BOLUS
INTRAVENOUS | Status: DC | PRN
Start: 2022-11-23 — End: 2022-11-23
  Administered 2022-11-23: 150 mg via INTRAVENOUS

## 2022-11-23 MED ORDER — PROMETHAZINE HCL 25 MG/ML IJ SOLN: 6.2500 mg | INTRAMUSCULAR | Status: DC | PRN

## 2022-11-23 MED ORDER — ACETAMINOPHEN 10 MG/ML IV SOLN: 1000.0000 mg | Freq: Once | INTRAVENOUS | Status: DC | PRN

## 2022-11-23 MED ORDER — 0.9 % SODIUM CHLORIDE (POUR BTL) OPTIME
TOPICAL | Status: DC | PRN
  Administered 2022-11-23: 1000 mL

## 2022-11-23 MED ORDER — LIDOCAINE HCL (CARDIAC) PF 100 MG/5ML IV SOSY
PREFILLED_SYRINGE | INTRAVENOUS | Status: DC | PRN
  Administered 2022-11-23: 80 mg via INTRAVENOUS

## 2022-11-23 MED ORDER — MIDAZOLAM HCL 2 MG/2ML IJ SOLN
INTRAMUSCULAR | Status: AC
  Filled 2022-11-23: qty 2

## 2022-11-23 MED ORDER — LACTATED RINGERS IV SOLN: INTRAVENOUS | Status: DC

## 2022-11-23 MED ORDER — ROCURONIUM BROMIDE 100 MG/10ML IV SOLN
INTRAVENOUS | Status: DC | PRN
  Administered 2022-11-23: 60 mg via INTRAVENOUS
  Administered 2022-11-23: 20 mg via INTRAVENOUS

## 2022-11-23 MED ORDER — OXYCODONE HCL 5 MG PO TABS: 5.0000 mg | ORAL_TABLET | Freq: Once | ORAL | Status: DC | PRN

## 2022-11-23 MED ORDER — PROPOFOL 10 MG/ML IV BOLUS
INTRAVENOUS | Status: AC
  Filled 2022-11-23: qty 40

## 2022-11-23 MED ORDER — ASPIRIN 81 MG PO TBEC
81.0000 mg | DELAYED_RELEASE_TABLET | Freq: Every day | ORAL | Status: DC
  Administered 2022-11-23 – 2022-11-28 (×6): 81 mg via ORAL
  Filled 2022-11-23 (×6): qty 1

## 2022-11-23 MED ORDER — TRANEXAMIC ACID-NACL 1000-0.7 MG/100ML-% IV SOLN
INTRAVENOUS | Status: AC
  Filled 2022-11-23: qty 100

## 2022-11-23 MED ORDER — BUPIVACAINE-EPINEPHRINE (PF) 0.25% -1:200000 IJ SOLN
INTRAMUSCULAR | Status: DC | PRN
  Administered 2022-11-23: 30 mL

## 2022-11-23 MED ORDER — MIDAZOLAM HCL 2 MG/2ML IJ SOLN
INTRAMUSCULAR | Status: DC | PRN
  Administered 2022-11-23: 2 mg via INTRAVENOUS

## 2022-11-23 MED ORDER — FERROUS SULFATE 325 (65 FE) MG PO TABS
325.0000 mg | ORAL_TABLET | Freq: Every day | ORAL | Status: DC
  Administered 2022-11-24 – 2022-11-26 (×3): 325 mg via ORAL
  Filled 2022-11-23 (×3): qty 1

## 2022-11-23 MED ORDER — ENOXAPARIN SODIUM 30 MG/0.3ML IJ SOSY
30.0000 mg | PREFILLED_SYRINGE | INTRAMUSCULAR | Status: DC
  Administered 2022-11-24 – 2022-11-27 (×4): 30 mg via SUBCUTANEOUS
  Filled 2022-11-23 (×4): qty 0.3

## 2022-11-23 MED ORDER — ALUM & MAG HYDROXIDE-SIMETH 200-200-20 MG/5ML PO SUSP: 30.0000 mL | ORAL | Status: DC | PRN

## 2022-11-23 SURGICAL SUPPLY — 48 items
10 mm Tapered Drill Bit IMPLANT
16 mm Flexible Drill Bit IMPLANT
3.2 mm Guide Wire, 400 mm IMPLANT
6 mm/9mm Cannulated Stepped Drill Bil IMPLANT
APL PRP STRL LF DISP 70% ISPRP (MISCELLANEOUS) ×2
BIT DRILL CANN 16 HIP (BIT) IMPLANT
BIT DRILL CANN STP 6/9 HIP (BIT) IMPLANT
BIT DRILL LONG 4.2 (BIT) IMPLANT
BIT DRILL TAPERED 10 (BIT) IMPLANT
BLADE TFNA HELICAL 115 NS (Anchor) IMPLANT
BNDG CMPR 5X4 CHSV STRCH STRL (GAUZE/BANDAGES/DRESSINGS) ×1
BNDG COHESIVE 4X5 TAN STRL LF (GAUZE/BANDAGES/DRESSINGS) ×1 IMPLANT
CHLORAPREP W/TINT 26 (MISCELLANEOUS) ×2 IMPLANT
DRAPE C-ARMOR (DRAPES) IMPLANT
DRAPE INCISE 23X17 STRL (DRAPES) ×1 IMPLANT
DRAPE INCISE IOBAN 23X17 STRL (DRAPES) ×1 IMPLANT
DRSG AQUACEL AG ADV 3.5X10 (GAUZE/BANDAGES/DRESSINGS) IMPLANT
DRSG AQUACEL AG ADV 3.5X14 (GAUZE/BANDAGES/DRESSINGS) IMPLANT
ELECT REM PT RETURN 9FT ADLT (ELECTROSURGICAL) ×1
ELECTRODE REM PT RTRN 9FT ADLT (ELECTROSURGICAL) ×1 IMPLANT
GAUZE SPONGE 4X4 12PLY STRL (GAUZE/BANDAGES/DRESSINGS) ×1 IMPLANT
GAUZE XEROFORM 1X8 LF (GAUZE/BANDAGES/DRESSINGS) IMPLANT
GLOVE INDICATOR 8.0 STRL GRN (GLOVE) ×2 IMPLANT
GLOVE SURG ORTHO 8.5 STRL (GLOVE) ×1 IMPLANT
GOWN STRL REUS W/ TWL LRG LVL3 (GOWN DISPOSABLE) ×1 IMPLANT
GOWN STRL REUS W/TWL LRG LVL3 (GOWN DISPOSABLE) ×1
GOWN STRL REUS W/TWL LRG LVL4 (GOWN DISPOSABLE) ×1 IMPLANT
GUIDEWIRE 3.2X400 (WIRE) IMPLANT
KIT TURNOVER KIT A (KITS) ×1 IMPLANT
MANIFOLD NEPTUNE II (INSTRUMENTS) ×1 IMPLANT
MAT ABSORB FLUID 56X50 GRAY (MISCELLANEOUS) ×1 IMPLANT
NAIL TROCH FIX 10X170 130 (Nail) IMPLANT
NDL SPNL 18GX3.5 QUINCKE PK (NEEDLE) ×1 IMPLANT
NEEDLE SPNL 18GX3.5 QUINCKE PK (NEEDLE) ×1 IMPLANT
NS IRRIG 500ML POUR BTL (IV SOLUTION) ×1 IMPLANT
PACK HIP COMPR (MISCELLANEOUS) ×1 IMPLANT
QC/Needle Point Extra Long, calibrated IMPLANT
SCREW LOCK STAR 5X38 (Screw) IMPLANT
SOL PREP PVP 2OZ (MISCELLANEOUS) ×1
SOLUTION PREP PVP 2OZ (MISCELLANEOUS) ×1 IMPLANT
STAPLER SKIN PROX 35W (STAPLE) ×1 IMPLANT
SUCTION TUBE FRAZIER 10FR DISP (SUCTIONS) ×1 IMPLANT
SUT VIC AB 0 CT1 36 (SUTURE) ×1 IMPLANT
SUT VIC AB 2-0 CT1 27 (SUTURE) ×2
SUT VIC AB 2-0 CT1 TAPERPNT 27 (SUTURE) ×1 IMPLANT
SYR 30ML LL (SYRINGE) ×1 IMPLANT
TRAP FLUID SMOKE EVACUATOR (MISCELLANEOUS) ×2 IMPLANT
WATER STERILE IRR 500ML POUR (IV SOLUTION) ×1 IMPLANT

## 2022-11-23 NOTE — Assessment & Plan Note (Signed)
Hyperlipidemia Change simvastatin 5 mg to atorvastatin 40 mg

## 2022-11-23 NOTE — Consult Note (Signed)
[facility]  Patient name: Bruce Lucas MRN: [id number] DOB: 1960/04/19 Sex: male   REQUESTING PROVIDER:    Hospitalists   REASON FOR CONSULT:    PAD  HISTORY OF PRESENT ILLNESS:   Bruce Lucas is a 62 y.o. male, who presented to the hospital on 11/22/2022 when he was unable to stand up off of the commode.  His legs fell out and he fell to the floor.  He was diagnosed with a right hip fracture which has been surgically corrected.  He underwent a CT angiogram because he had some right leg numbness at the time of his fall.  This showed vascular disease in the tibial vessels and so we were consulted.  The patient thinks that his fall was secondary to low blood sugar.  He has been a type I diabetic since age 76.  He denies rest pain.  He denies claudication symptoms.  He denies rest pain.  The patient has a history of coronary artery disease.  He had a stroke in 2020 which is left him with some balance issues.  He is a former smoker.  He takes a statin for hypercholesterolemia.  PAST MEDICAL HISTORY    Past Medical History:  Diagnosis Date   Coronary artery disease    Diabetes mellitus (HCC)    Type 1   Diabetic peripheral neuropathy (HCC)    Diabetic retinopathy (HCC)    Hyperlipidemia    Hypertension    Stroke (HCC)    06/2018     FAMILY HISTORY   Family History  Problem Relation Age of Onset   Brain cancer Mother    Hypertension Father     SOCIAL HISTORY:   Social History   Socioeconomic History   Marital status: Divorced    Spouse name: Not on file   Number of children: Not on file   Years of education: Not on file   Highest education level: Not on file  Occupational History   Not on file  Tobacco Use   Smoking status: Former    Current packs/day: 0.00    Average packs/day: 0.5 packs/day for 10.0 years (5.0 ttl pk-yrs)    Types: Cigarettes    Start date: 04/08/2008    Quit date:  04/08/2018    Years since quitting: 4.6   Smokeless tobacco: Never   Tobacco comments:    At work  Vaping Use   Vaping status: Never Used  Substance and Sexual Activity   Alcohol use: No   Drug use: No   Sexual activity: Not on file  Other Topics Concern   Not on file  Social History Narrative   Not on file   Social Determinants of Health   Financial Resource Strain: Low Risk  (08/26/2022)   Received from [facility], [facility]   Overall Financial Resource Strain (CARDIA)    Difficulty of Paying Living Expenses: Not hard at all  Food Insecurity: No Food Insecurity (11/23/2022)   Hunger Vital Sign    Worried About Running Out of Food in the Last Year: Never true    Ran Out of Food in the Last Year: Never true  Transportation Needs: No Transportation Needs (11/23/2022)   PRAPARE - Administrator, Civil Service (Medical): No    Lack of Transportation (Non-Medical): No  Physical Activity: Not on file  Stress: Not on file  Social Connections: Unknown (08/17/2021)   Received from [facility], [facility]  Social Network    Social Network: Not on file  Intimate Partner Violence: Not At Risk (11/23/2022)   Humiliation, Afraid, Rape, and Kick questionnaire    Fear of Current or Ex-Partner: No    Emotionally Abused: No    Physically Abused: No    Sexually Abused: No    ALLERGIES:    Allergies  Allergen Reactions   Codeine    Penicillins     CURRENT MEDICATIONS:    Current Facility-Administered Medications  Medication Dose Route Frequency Provider Last Rate Last Admin   0.45 % sodium chloride infusion   Intravenous Continuous Deeann Saint, MD 75 mL/hr at 11/23/22 1843 Infusion Verify at 11/23/22 1843   [START ON 11/24/2022] acetaminophen (TYLENOL) tablet 325-650 mg  325-650 mg Oral Q6H PRN Deeann Saint, MD       alum & mag hydroxide-simeth (MAALOX/MYLANTA) 200-200-20 MG/5ML suspension 30 mL  30 mL Oral Q4H PRN Deeann Saint, MD       aspirin EC tablet 81 mg  81 mg Oral Daily Core, Doy Hutching, MD       atorvastatin (LIPITOR) tablet 40 mg  40 mg Oral Daily Core, Doy Hutching, MD       bisacodyl (DULCOLAX) suppository 10 mg  10 mg Rectal Daily PRN Deeann Saint, MD       ceFEPIme (MAXIPIME) 2 g in sodium chloride 0.9 % 100 mL IVPB  2 g Intravenous Epimenio Foot, MD   Stopped at 11/23/22 1258   cyanocobalamin (VITAMIN B12) tablet 1,000 mcg  1,000 mcg Oral Daily Deeann Saint, MD   1,000 mcg at 11/23/22 0800   DULoxetine (CYMBALTA) DR capsule 30 mg  30 mg Oral Daily Deeann Saint, MD   30 mg at 11/23/22 0800   [START ON 11/24/2022] enoxaparin (LOVENOX) injection 30 mg  30 mg Subcutaneous Q24H Deeann Saint, MD       feeding supplement (ENSURE ENLIVE / ENSURE PLUS) liquid 237 mL  237 mL Oral TID BM Core, Doy Hutching, MD       [START ON 11/24/2022] ferrous sulfate tablet 325 mg  325 mg Oral Q breakfast Deeann Saint, MD       HYDROcodone-acetaminophen (NORCO) 7.5-325 MG per tablet 1-2 tablet  1-2 tablet Oral Q4H PRN Deeann Saint, MD       HYDROcodone-acetaminophen (NORCO/VICODIN) 5-325 MG per tablet 1-2 tablet  1-2 tablet Oral Q4H PRN Deeann Saint, MD   1 tablet at 11/23/22 1433   insulin aspart (novoLOG) injection 0-9 Units  0-9 Units Subcutaneous Q4H Deeann Saint, MD   7 Units at 11/23/22 1653   [START ON 11/24/2022] insulin aspart (novoLOG) injection 2 Units  2 Units Subcutaneous TID WC Core, Doy Hutching, MD       insulin glargine-yfgn (SEMGLEE) injection 10 Units  10 Units Subcutaneous QHS Core, Doy Hutching, MD       lactated ringers infusion   Intravenous Continuous Deeann Saint, MD   New Bag at 11/23/22 [phone]   magnesium hydroxide (MILK OF MAGNESIA) suspension 30 mL  30 mL Oral Daily PRN Deeann Saint, MD       melatonin tablet 10 mg  10 mg Oral QHS Deeann Saint, MD   10 mg at 11/22/22 2024   menthol-cetylpyridinium (CEPACOL) lozenge 3 mg  1 lozenge Oral PRN Deeann Saint, MD       Or   phenol  (CHLORASEPTIC) mouth spray 1 spray  1 spray Mouth/Throat PRN Deeann Saint, MD       metoCLOPramide (REGLAN) tablet  5-10 mg  5-10 mg Oral Q8H PRN Deeann Saint, MD       Or   metoCLOPramide (REGLAN) injection 5-10 mg  5-10 mg Intravenous Q8H PRN Deeann Saint, MD       metoprolol succinate (TOPROL-XL) 24 hr tablet 12.5 mg  12.5 mg Oral Daily Deeann Saint, MD   12.5 mg at 11/23/22 0800   morphine (PF) 2 MG/ML injection 0.5-1 mg  0.5-1 mg Intravenous Q2H PRN Deeann Saint, MD       Melene Muller ON 11/24/2022] multivitamin with minerals tablet 1 tablet  1 tablet Oral Daily Core, Doy Hutching, MD       ondansetron [facility]) tablet 4 mg  4 mg Oral Q6H PRN Deeann Saint, MD       Or   ondansetron [facility]) injection 4 mg  4 mg Intravenous Q6H PRN Deeann Saint, MD       oxyCODONE (Oxy IR/ROXICODONE) immediate release tablet 5-10 mg  5-10 mg Oral Q6H PRN Deeann Saint, MD   10 mg at 11/23/22 1653   polyethylene glycol (MIRALAX / GLYCOLAX) packet 17 g  17 g Oral Daily PRN Deeann Saint, MD       senna Mancel Parsons) tablet 8.6 mg  1 tablet Oral BID Deeann Saint, MD   8.6 mg at 11/23/22 1224   sodium phosphate (FLEET) enema 1 enema  1 enema Rectal Once PRN Deeann Saint, MD       traZODone (DESYREL) tablet 50 mg  50 mg Oral QHS Deeann Saint, MD   50 mg at 11/22/22 2024    REVIEW OF SYSTEMS:   [X]  denotes positive finding, [ ]  denotes negative finding Cardiac  Comments:  Chest pain or chest pressure:    Shortness of breath upon exertion:    Short of breath when lying flat:    Irregular heart rhythm:        Vascular    Pain in calf, thigh, or hip brought on by ambulation:    Pain in feet at night that wakes you up from your sleep:     Blood clot in your veins:    Leg swelling:         Pulmonary    Oxygen at home:    Productive cough:     Wheezing:         Neurologic    Sudden weakness in arms or legs:     Sudden numbness in arms or legs:     Sudden onset of difficulty speaking or slurred  speech:    Temporary loss of vision in one eye:     Problems with dizziness:         Gastrointestinal    Blood in stool:      Vomited blood:         Genitourinary    Burning when urinating:     Blood in urine:        Psychiatric    Major depression:         Hematologic    Bleeding problems:    Problems with blood clotting too easily:        Skin    Rashes or ulcers:        Constitutional    Fever or chills:     PHYSICAL EXAM:   Vitals:   11/23/22 1100 11/23/22 1143 11/23/22 1540 11/23/22 1940  BP: (!) 124/58 121/63 122/63 (!) 99/54  Pulse: 77 83 95 88  Resp: 13 18 18 18   Temp: 97.8 F (36.6  C) 98.4 F (36.9 C) 98.3 F (36.8 C) 97.6 F (36.4 C)  TempSrc:    Oral  SpO2: 95% 93% 95% 98%  Weight:      Height:        GENERAL: The patient is a well-nourished male, in no acute distress. The vital signs are documented above. CARDIAC: There is a regular rate and rhythm.  VASCULAR: Bilateral lower extremities are warm well-perfused.  There are no palpable pedal pulses.  He has palpable femoral pulses. PULMONARY: Nonlabored respirations ABDOMEN: Soft and non-tender with normal pitched bowel sounds.  MUSCULOSKELETAL: There are no major deformities or cyanosis. NEUROLOGIC: No focal weakness or paresthesias are detected. SKIN:  anterior shin healing traumatic wound. PSYCHIATRIC: The patient has a normal affect.  STUDIES:   I have reviewed his CTA with the following findings: 1. No acute findings within the abdomen or pelvis. 2. Acute and comminuted fracture deformity involving the intertrochanteric portions of the proximal right femur. 3. Extensive atherosclerotic disease involving the lower extremity arterial vessels. Multiple areas of high-grade luminal narrowing identified involving the anterior tibial arteries bilaterally with distal contrast opacification of the vessels to the level of the ankle. 4. Small hiatal hernia. 5. 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 6.  Aortic Atherosclerosis (ICD10-I70.0).  ASSESSMENT and PLAN   PAD: The patient has atherosclerotic vascular disease on CT scan but does not have active symptoms.  I do not feel that his fall was related to his vascular disease.  No surgical intervention is recommended at this time.  He should focus on medical management including antiplatelet therapy with 81 mg aspirin, statin therapy, strict blood pressure control, smoking cessation, and an exercise program.  I discussed that if he developed a change in symptoms or had a nonhealing wound that we would need to reevaluate him.  Otherwise, he can follow-up in the office in 6 months with repeat ABI imaging.   Charlena Cross, MD, FACS [facility] [phone] Pager [phone]

## 2022-11-23 NOTE — Anesthesia Postprocedure Evaluation (Signed)
Anesthesia Post Note  Patient: Bruce Lucas  Procedure(s) Performed: INTRAMEDULLARY (IM) NAIL INTERTROCHANTERIC (Right)  Patient location during evaluation: PACU Anesthesia Type: General Level of consciousness: awake and alert Pain management: pain level controlled Vital Signs Assessment: post-procedure vital signs reviewed and stable Respiratory status: spontaneous breathing, nonlabored ventilation and respiratory function stable Cardiovascular status: blood pressure returned to baseline and stable Postop Assessment: no apparent nausea or vomiting Anesthetic complications: yes   Encounter Notable Events  Notable Event Outcome Phase Comment  Difficult to intubate - expected  Intraprocedure Filed from anesthesia note documentation.     Last Vitals:  Vitals:   11/23/22 1100 11/23/22 1143  BP: (!) 124/58 121/63  Pulse: 77 83  Resp: 13 18  Temp: 36.6 C 36.9 C  SpO2: 95% 93%    Last Pain:  Vitals:   11/23/22 1100  TempSrc:   PainSc: 0-No pain                 Foye Deer

## 2022-11-23 NOTE — Assessment & Plan Note (Signed)
  Hyponatremia, improving Likely secondary to the patient's volume status Initial sodium 133 has improved to 133

## 2022-11-23 NOTE — Anesthesia Procedure Notes (Signed)
Procedure Name: Intubation Date/Time: 11/23/2022 8:43 AM  Performed by: Katherine Basset, CRNAPre-anesthesia Checklist: Patient identified, Emergency Drugs available, Suction available and Patient being monitored Patient Re-evaluated:Patient Re-evaluated prior to induction Oxygen Delivery Method: Circle system utilized Preoxygenation: Pre-oxygenation with 100% oxygen Induction Type: IV induction Ventilation: Two handed mask ventilation required Laryngoscope Size: McGraph and 4 Grade View: Grade II Tube type: Oral Tube size: 7.5 mm Number of attempts: 1 Airway Equipment and Method: Stylet, Oral airway and Bite block Placement Confirmation: ETT inserted through vocal cords under direct vision, positive ETCO2 and breath sounds checked- equal and bilateral Secured at: 23 cm Tube secured with: Tape Dental Injury: Teeth and Oropharynx as per pre-operative assessment  Difficulty Due To: Difficult Airway- due to limited oral opening and Difficulty was anticipated

## 2022-11-23 NOTE — H&P (Signed)
  Patient was examined and treatment plan discussed with him at length this morning.  He wishes to proceed with surgery  The risks and benefits of surgery versus nonoperative treatment were discussed in length.  Postop protocol was discussed. Exam showed the right leg is shortened and x-ray rotated.  He has pain with movement of the right hip.  Neurovascular status is satisfactory distally.  Left leg is unremarkable.  Upper extremities are unremarkable.  Spine is nontender. We will proceed with surgery this morning.  THE PATIENT WAS SEEN PRIOR TO SURGERY TODAY.  HISTORY, ALLERGIES, HOME MEDICATIONS AND OPERATIVE PROCEDURE WERE REVIEWED. RISKS AND BENEFITS OF SURGERY DISCUSSED WITH PATIENT AGAIN.  NO CHANGES FROM INITIAL HISTORY AND PHYSICAL NOTED.

## 2022-11-23 NOTE — Op Note (Signed)
DATE OF SURGERY:  11/23/2022  TIME: 10:39 AM  PATIENT NAME:  Bruce Lucas  AGE: 62 y.o.  PRE-OPERATIVE DIAGNOSIS: Comminuted, displaced, intertrochanteric right hip fracture  POST-OPERATIVE DIAGNOSIS:  SAME  PROCEDURE:  INTRAMEDULLARY (IM) NAIL INTERTROCHANTERIC right hip  SURGEON:  Valinda Hoar  ASST:  EBL: 100 cc  COMPLICATIONS: None  OPERATIVE IMPLANTS: Synthes trochanteric femoral nail 10 mm / 130 degree with interlocking helical blade under 15 mm and distal locking screw 38 mm.  PREOPERATIVE INDICATIONS:  Bruce Lucas is a 62 y.o. year old who fell and suffered a hip fracture. He was brought into the ER and then admitted and optimized and then elected for surgical intervention.    The risks benefits and alternatives were discussed with the patient including but not limited to the risks of nonoperative treatment, versus surgical intervention including infection, bleeding, nerve injury, malunion, nonunion, hardware prominence, hardware failure, need for hardware removal, blood clots, cardiopulmonary complications, morbidity, mortality, among others, and they were willing to proceed.    OPERATIVE PROCEDURE:  The patient was brought to the operating room and placed in the supine position.  General endotracheal anesthesia was administered, without a foley. He was placed on the fracture table.  Closed reduction was performed under C-arm guidance. The length of the femur was also measured using fluoroscopy. Time out was then performed after sterile prep and drape. He received preoperative antibiotics.  Incision was made proximal to the greater trochanter. A guidewire was placed in the appropriate position. Confirmation was made on AP and lateral views. The above-named nail was opened. I opened the proximal femur with a reamer. I then placed the nail by hand easily down. I did not need to ream the femur.  Once the nail was completely seated, I placed a guidepin into the  femoral head into the center center position through a second incision.  I measured the length, and then reamed the lateral cortex and up into the head. I then placed the helical blade. Slight compression was applied. Anatomic fixation achieved. Bone quality was mediocre.  I then secured the proximal interlock.  The distal locking screw was then placed and after confirming the position of the fracture fragments and hardware I then removed the instruments, and took final C-arm pictures AP and lateral the entire length of the leg. Anatomic reconstruction was achieved, and the wounds were irrigated copiously and closed with Vicryl  followed by staples and dry sterile dressing. Sponge and needle count were correct.   The patient was awakened and returned to PACU in stable and satisfactory condition. There no complications and the patient tolerated the procedure well.  He will be partial weightbearing as tolerated, and will be on Lovenox  For DVT prophylaxis.     Valinda Hoar, M.D.

## 2022-11-23 NOTE — Assessment & Plan Note (Signed)
Anemia Hemoglobin 8.2 Will repeat in the a.m. and continue to follow, down from 9.9 presenting

## 2022-11-23 NOTE — Assessment & Plan Note (Signed)
Peripheral arterial disease CT angio done yesterday reveals extensive peripheral artery arterial disease of the lower extremity:  Extensive atherosclerotic disease involving the lower extremity arterial vessels. Multiple areas of high-grade luminal narrowing identified involving the anterior tibial arteries bilaterally with distal contrast opacification of the vessels to the level of the ankle. Patient is not have any rest pain reports symptoms are at baseline and we will start aspirin and increase his statin simvastatin 5 mg to atorvastatin 40 Given no rest pain does not seem to account for the extreme LA at presentation, S/T Dr. Myra Gianotti (Vascular surgery) via telephone who will kindly consult

## 2022-11-23 NOTE — Assessment & Plan Note (Signed)
Acute kidney injury Likely secondary to prerenal cause Creatinine improved from 1.14-0.89 today Will avoid nephrotoxins and recheck in the a.m. The patient's ACE inhibitor has been stopped Continue IV lactated Ringer's

## 2022-11-23 NOTE — Assessment & Plan Note (Signed)
Secondary hypercoagulable state In the setting of postoperative immobility continue enoxaparin

## 2022-11-23 NOTE — Transfer of Care (Signed)
Immediate Anesthesia Transfer of Care Note  Patient: Bruce Lucas  Procedure(s) Performed: INTRAMEDULLARY (IM) NAIL INTERTROCHANTERIC (Right)  Patient Location: PACU  Anesthesia Type:General  Level of Consciousness: awake, alert , and oriented  Airway & Oxygen Therapy: Patient Spontanous Breathing and Patient connected to face mask oxygen  Post-op Assessment: Report given to RN, Post -op Vital signs reviewed and stable, and Patient moving all extremities  Post vital signs: Reviewed and stable  Last Vitals:  Vitals Value Taken Time  BP    Temp 36.6 C 11/23/22 1039  Pulse 74 11/23/22 1043  Resp 18 11/23/22 1043  SpO2 100 % 11/23/22 1043  Vitals shown include unfiled device data.  Last Pain:  Vitals:   11/23/22 0800  TempSrc:   PainSc: 2          Complications:  Encounter Notable Events  Notable Event Outcome Phase Comment  Difficult to intubate - expected  Intraprocedure Filed from anesthesia note documentation.

## 2022-11-23 NOTE — Assessment & Plan Note (Signed)
Sepsis, rule out Differential diagnosis severe volume depletion secondary to osmotic diuresis, patient is a equivocal historian on what caused his constitutional symptoms leading to his fall Patient drop in temp (36 C) overnight and soft pressure this morning presenting discomfort to the theory initial hypoperfusion picture was all volume (LA 7) Potential leukocytosis was reactive and improving but patient has also been significantly volume expanded Will start cefepime and obtain blood cultures in addition to re-starting lactated Ringer's Chest x-ray was clear, follow blood culture and clinical course postoperatively Will continue empiric antibiotics until blood cultures negative at 24 hours

## 2022-11-23 NOTE — Assessment & Plan Note (Signed)
Type 1 diabetes mellitus with hyperglycemia Hyperglycemia 363 improved down to 213 now back to 300s postoperatively At home patient uses 14 units of long-acting insulin at bedtime currently ordered 7 in hospital we will increase this to 12 Start prandial insulin 2 units with meals as well as part in addition to sliding scale insulin

## 2022-11-23 NOTE — Progress Notes (Signed)
Progress Note   Patient: Bruce Lucas AOZ:[id number] DOB: [birth year] DOA: 11/22/2022     1 DOS: the patient was seen and examined on 11/23/2022   Brief hospital course: 62 year old gentleman with a past medical history of type 1 diabetes mellitus, heart failure with reduced ejection fraction, hypertension, CVA with residual right-sided weakness, coronary artery disease status post CABG who presented to the emergency department after he fell off the toilet hitting his right lower extremity.  He is now status post IM nailing of his right lower extremity.  Presented with significant lactic acid elevation and leukocytosis, has improved with volume expansion. Found to have PAD on CT-A.  Assessment and Plan: * Femoral fracture (HCC) Right hip fracture Status post IM nailing, patient seen postoperatively Dressing clean and dry  Secondary hypercoagulable state (HCC) Secondary hypercoagulable state In the setting of postoperative immobility continue enoxaparin  Normocytic anemia Anemia Hemoglobin 8.2 Will repeat in the a.m. and continue to follow, down from 9.9 presenting  Peripheral arterial disease (HCC) Peripheral arterial disease CT angio done yesterday reveals extensive peripheral artery arterial disease of the lower extremity:  Extensive atherosclerotic disease involving the lower extremity arterial vessels. Multiple areas of high-grade luminal narrowing identified involving the anterior tibial arteries bilaterally with distal contrast opacification of the vessels to the level of the ankle. Patient is not have any rest pain reports symptoms are at baseline and we will start aspirin and increase his statin simvastatin 5 mg to atorvastatin 40 Given no rest pain does not seem to account for the extreme LA at presentation, S/T Dr. Myra Gianotti (Vascular surgery) via telephone who will kindly consult   AKI (acute kidney injury) (HCC) Acute kidney injury Likely secondary to prerenal  cause Creatinine improved from 1.14-0.89 today Will avoid nephrotoxins and recheck in the a.m. The patient's ACE inhibitor has been stopped Continue IV lactated Ringer's  Diabetes mellitus (HCC) Type 1 diabetes mellitus with hyperglycemia Hyperglycemia 363 improved down to 213 now back to 300s postoperatively At home patient uses 14 units of long-acting insulin at bedtime currently ordered 7 in hospital we will increase this to 12 Start prandial insulin 2 units with meals as well as part in addition to sliding scale insulin   Hyponatremia  Hyponatremia, improving Likely secondary to the patient's volume status Initial sodium 133 has improved to 133   Sepsis (HCC) Sepsis, rule out Differential diagnosis severe volume depletion secondary to osmotic diuresis, patient is a equivocal historian on what caused his constitutional symptoms leading to his fall Patient drop in temp (36 C) overnight and soft pressure this morning presenting discomfort to the theory initial hypoperfusion picture was all volume (LA 7) Potential leukocytosis was reactive and improving but patient has also been significantly volume expanded Will start cefepime and obtain blood cultures in addition to re-starting lactated Ringer's Chest x-ray was clear, follow blood culture and clinical course postoperatively Will continue empiric antibiotics until blood cultures negative at 24 hours   HFrEF (heart failure with reduced ejection fraction) (HCC) No evidence of hypervolemia at this time to suggest acute exacerbation  - Daily weights - Strict in and out  Hyperlipidemia Hyperlipidemia Change simvastatin 5 mg to atorvastatin 40 mg   Essential hypertension Hypertension Pressures have been soft with patient reporting some orthostasis Continue metoprolol 12.5 mg in the setting of postoperative monitor pressures closely stopped ACE inhibitor   Coronary artery disease involving native coronary artery of native  heart without angina pectoris - Continue home aspirin, statin and as needed nitro  Subjective: Denies any pain, reports sensation intact b/L le no fever or chills "a lot going on"  Physical Exam: Vitals:   11/23/22 1045 11/23/22 1100 11/23/22 1143 11/23/22 1540  BP: 136/61 (!) 124/58 121/63 122/63  Pulse: 77 77 83 95  Resp: 18 13 18 18   Temp:  97.8 F (36.6 C) 98.4 F (36.9 C) 98.3 F (36.8 C)  TempSrc:      SpO2: 99% 95% 93% 95%  Weight:      Height:      Patient seen post-op Constitutional:  Vital Signs as per Above [facility] than three noted] No Acute Distress Eyes:  Pink Conjunctiva and no Ptosis Neck:     Trachea Midline, Neck Symmetric Respiratory:   Respiratory Effort Normal: No Use of Respiratory Muscles,No  Intercostal Retractions             Lungs Clear to Auscultation Bilaterally Cardiovascular:   Heart Auscultated: Regular Regular without any added sounds or murmurs              No Lower Extremity Edema, Sensation Intact B/L LE , CRT Circa 4 seconds b/l LE             Right hip dressing clean and dry Gastrointestinal:  Abdomen soft and nontender without palpable masses, guarding or rebound  No Palpable Splenomegaly or Hepatomegaly Psychiatric:  Patient Orientated to Time, Place and Person Patient with appropriate mood and affect Recent and Remote Memory Intact  Data Reviewed: CT-A, Labs as noted  Family Communication: N/A  Disposition: Status is: Inpatient  Planned Discharge Destination: Skilled nursing facility    Time spent: 55 minutes  Author: Princess Bruins, MD 11/23/2022 7:00 PM  For on call review [email or web address].

## 2022-11-23 NOTE — Consult Note (Signed)
Pharmacy Antibiotic Note  Bruce Lucas is a 62 y.o. male with a PMH of HFrEF, T1DM, HTN, CAD, and CVA with residual right-sided deficits admitted on 11/22/2022 with right femoral fracture and potential bacteremia. Pharmacy has been consulted for cefepime dosing. Patient is afebrile with WBC trending down 22.4 > 12.4. Lactic acid levels are high with concern for hypotension and hypothermia. Per MD, starting empiric therapy for possible bacteremia. Cultures are pending.   Plan: Initiate cefepime IV 2 g q8h for potential bacteremia. No dose adjustments needed. Duration of treatment pending blood culture results. Monitor renal function as clinically indicated  Follow-up and de-escalate cefepime as able    Height: 5\' 11"  (180.3 cm) Weight: 94 kg (207 lb 3.7 oz) IBW/kg (Calculated) : 75.3 CrCl: 99.5 mL/min  Temp (24hrs), Avg:98 F (36.7 C), Min:97.5 F (36.4 C), Max:98.3 F (36.8 C)  Recent Labs  Lab 11/22/22 1244 11/22/22 1415 11/22/22 1510 11/22/22 1704 11/22/22 1928 11/23/22 0433  WBC 22.4*  --   --   --   --  12.4*  CREATININE 1.02  --   --   --  1.14 0.89  LATICACIDVEN  --  3.6* 7.0* 4.6* 3.3*  --     Estimated Creatinine Clearance: 99.5 mL/min (by C-G formula based on SCr of 0.89 mg/dL).    Allergies  Allergen Reactions   Codeine    Penicillins     Antimicrobials this admission: Cefepime 8/17 >>    Dose adjustments this admission:   Microbiology results: 8/17 BCx: pending   Thank you for allowing pharmacy to be a part of this patient's care.  Bruce Lucas  Bruce Lucas 11/23/2022 9:12 AM

## 2022-11-23 NOTE — Progress Notes (Signed)
Initial Nutrition Assessment  DOCUMENTATION CODES:   Not applicable  INTERVENTION:   Ensure Enlive po TID, each supplement provides 350 kcal and 20 grams of protein.  MVI po daily   Daily weights   NUTRITION DIAGNOSIS:   Increased nutrient needs related to post-op healing as evidenced by estimated needs.  GOAL:   Patient will meet greater than or equal to 90% of their needs  MONITOR:   PO intake, Supplement acceptance, Labs, Weight trends, Skin, I & O's  REASON FOR ASSESSMENT:   Consult Hip fracture protocol  ASSESSMENT:   62 y/o male with h/o IDDM, HFrEF, hypertension, CVA with right-sided residual deficits, CAD s/p CABG (2020) and HLD who is admitted with hip fracture after fall.  RD working remotely.  Unable to reach pt by phone. Pt with increased estimated needs r/t hip fracture. Pt NPO most of today for IM nailing. Pt has now been initiated on a regular diet. RD will add supplements and MVI to help pt meet his estimated. Per chart, pt appears weight stable pta. RD will obtain nutrition related exam and history at follow up.   Medications reviewed and include: B12, lovenox, ferrous sulfate, insulin, melatonin, senokot, cefepime  Labs reviewed: Na 133(L), K 4.3 wnl, BUN 25(H) Wbc- 12.4(H), Hgb 8.2(L), Hct 25.0(L),  MCV 72.7(L), MCH 23.8(L) Cbgs- 305, 194, 160, 216, 198 x 24 hrs  AIC 6.9- 8/16  NUTRITION - FOCUSED PHYSICAL EXAM: Unable to perform at this time   Diet Order:   Diet Order             Diet regular Room service appropriate? Yes; Fluid consistency: Thin  Diet effective now                  EDUCATION NEEDS:   Not appropriate for education at this time  Skin:  Skin Assessment: Reviewed RN Assessment (incision R hip)  Last BM:  8/15  Height:   Ht Readings from Last 1 Encounters:  11/22/22 5\' 11"  (1.803 m)    Weight:   Wt Readings from Last 1 Encounters:  11/23/22 94 kg    Ideal Body Weight:  78 kg  BMI:  Body mass index is  28.9 kg/m.  Estimated Nutritional Needs:   Kcal:  2100-2400kcal/day  Protein:  105-120g/day  Fluid:  2.0L/day  Betsey Holiday MS, RD, LDN Please refer to [facility] for RD and/or RD on-call/weekend/after hours pager

## 2022-11-24 ENCOUNTER — Other Ambulatory Visit: Payer: Self-pay

## 2022-11-24 DIAGNOSIS — S728X1A Other fracture of right femur, initial encounter for closed fracture: Secondary | ICD-10-CM | POA: Diagnosis not present

## 2022-11-24 LAB — CBC
HCT: 25 % — ABNORMAL LOW (ref 39.0–52.0)
Hemoglobin: 7.7 g/dL — ABNORMAL LOW (ref 13.0–17.0)
MCH: 23.5 pg — ABNORMAL LOW (ref 26.0–34.0)
MCHC: 30.8 g/dL (ref 30.0–36.0)
MCV: 76.2 fL — ABNORMAL LOW (ref 80.0–100.0)
Platelets: 267 10*3/uL (ref 150–400)
RBC: 3.28 MIL/uL — ABNORMAL LOW (ref 4.22–5.81)
RDW: 14.8 % (ref 11.5–15.5)
WBC: 12.6 10*3/uL — ABNORMAL HIGH (ref 4.0–10.5)
nRBC: 0 % (ref 0.0–0.2)

## 2022-11-24 LAB — GLUCOSE, CAPILLARY
Glucose-Capillary: 184 mg/dL — ABNORMAL HIGH (ref 70–99)
Glucose-Capillary: 217 mg/dL — ABNORMAL HIGH (ref 70–99)
Glucose-Capillary: 215 mg/dL — ABNORMAL HIGH (ref 70–99)
Glucose-Capillary: 169 mg/dL — ABNORMAL HIGH (ref 70–99)
Glucose-Capillary: 179 mg/dL — ABNORMAL HIGH (ref 70–99)

## 2022-11-24 LAB — BASIC METABOLIC PANEL
Anion gap: 8 (ref 5–15)
BUN: 24 mg/dL — ABNORMAL HIGH (ref 8–23)
CO2: 23 mmol/L (ref 22–32)
Calcium: 8.2 mg/dL — ABNORMAL LOW (ref 8.9–10.3)
Chloride: 100 mmol/L (ref 98–111)
Creatinine, Ser: 0.89 mg/dL (ref 0.61–1.24)
GFR, Estimated: >60 mL/min (ref >60)
Glucose, Bld: 196 mg/dL — ABNORMAL HIGH (ref 70–99)
Potassium: 4.6 mmol/L (ref 3.5–5.1)
Sodium: 131 mmol/L — ABNORMAL LOW (ref 135–145)

## 2022-11-24 LAB — LACTIC ACID, PLASMA: Lactic Acid, Venous: 1.5 mmol/L (ref 0.5–1.9)

## 2022-11-24 LAB — PREPARE RBC (CROSSMATCH)

## 2022-11-24 MED ORDER — INSULIN ASPART 100 UNIT/ML IJ SOLN
3.0000 [IU] | Freq: Three times a day (TID) | INTRAMUSCULAR | Status: DC
  Administered 2022-11-24 – 2022-11-25 (×4): 3 [IU] via SUBCUTANEOUS
  Filled 2022-11-24 (×4): qty 1

## 2022-11-24 MED ORDER — POLYETHYLENE GLYCOL 3350 17 G PO PACK
17.0000 g | PACK | Freq: Every day | ORAL | Status: DC
  Administered 2022-11-24 – 2022-11-28 (×3): 17 g via ORAL
  Filled 2022-11-24 (×5): qty 1

## 2022-11-24 MED ORDER — SODIUM CHLORIDE 0.9% IV SOLUTION: Freq: Once | INTRAVENOUS | Status: DC

## 2022-11-24 MED ORDER — INSULIN GLARGINE-YFGN 100 UNIT/ML ~~LOC~~ SOLN
12.0000 [IU] | Freq: Every day | SUBCUTANEOUS | Status: DC
  Administered 2022-11-24: 12 [IU] via SUBCUTANEOUS
  Filled 2022-11-24 (×2): qty 0.12

## 2022-11-24 MED ORDER — INSULIN ASPART 100 UNIT/ML IJ SOLN
0.0000 [IU] | Freq: Three times a day (TID) | INTRAMUSCULAR | Status: DC
  Administered 2022-11-24: 3 [IU] via SUBCUTANEOUS
  Administered 2022-11-24: 2 [IU] via SUBCUTANEOUS
  Administered 2022-11-25 (×2): 3 [IU] via SUBCUTANEOUS
  Administered 2022-11-25: 7 [IU] via SUBCUTANEOUS
  Administered 2022-11-26 (×2): 3 [IU] via SUBCUTANEOUS
  Administered 2022-11-26: 7 [IU] via SUBCUTANEOUS
  Administered 2022-11-27 (×2): 2 [IU] via SUBCUTANEOUS
  Administered 2022-11-27 – 2022-11-28 (×2): 3 [IU] via SUBCUTANEOUS
  Administered 2022-11-28 (×2): 2 [IU] via SUBCUTANEOUS
  Filled 2022-11-24 (×14): qty 1

## 2022-11-24 MED ORDER — SODIUM CHLORIDE 0.9% IV SOLUTION: Freq: Once | INTRAVENOUS | Status: AC

## 2022-11-24 NOTE — Progress Notes (Signed)
Inpatient Rehab Admissions Coordinator:   Per therapy recommendations, patient was screened for CIR candidacy by Megan Salon, MS, CCC-SLP. At this time, Pt. does not appear to demonstrate medical necessity to justify in hospital rehabilitation/CIR. I also do not think his insurance will approve CIR for this diagnosis. I will not pursue a rehab consult for this Pt.   Recommend other rehab venues to be pursued.  Please contact me with any questions.  Megan Salon, MS, CCC-SLP Rehab Admissions Coordinator  [phone] (celll) [phone] (office)

## 2022-11-24 NOTE — Progress Notes (Signed)
Progress Note   Patient: Bruce Lucas WUJ:[id number] DOB: 05-Mar-1961 DOA: 11/22/2022     2 DOS: the patient was seen and examined on 11/24/2022   Brief hospital course: 62 year old gentleman with a past medical history of type 1 diabetes mellitus, heart failure with reduced ejection fraction, hypertension, CVA with residual right-sided weakness, coronary artery disease status post CABG who presented to the emergency department after he fell off the toilet hitting his right lower extremity.  He is now status post IM nailing of his right lower extremity.  Presented with significant lactic acid elevation and leukocytosis, has improved with volume expansion. Found to have PAD on CT-A.  Assessment and Plan: * Femoral fracture (HCC) Right hip fracture Status post IM nailing,  Some oozing at dressing site Ortho still to see Pt/ot consulted Pain control Up bowel regimen, no bm yet post surgery  Blood loss anemia 9.9 on presentation, 7.7 today after surgery - transfuse one unit given cad  Peripheral arterial disease (HCC) Peripheral arterial disease CT angio done yesterday reveals extensive peripheral artery arterial disease of the lower extremity:  Extensive atherosclerotic disease involving the lower extremity arterial vessels. Multiple areas of high-grade luminal narrowing identified involving the anterior tibial arteries bilaterally with distal contrast opacification of the vessels to the level of the ankle. Patient is not have any rest pain reports symptoms are at baseline and we will start aspirin and increase his statin simvastatin 5 mg to atorvastatin 40 Given no rest pain does not seem to account for the extreme LA at presentation - vascular consult pending  AKI (acute kidney injury) (HCC) Resolved w/ fluids, tolerating po - stop fluids  Type 1 Diabetes mellitus (HCC) Hyperglycemic today - will increase basal/bolus, continue ssi  HFrecoveredEF (HCC) Appears euvolemic - cont  home metop  Hyperlipidemia Hyperlipidemia Change simvastatin 5 mg to atorvastatin 40 mg  Essential hypertension Bps wnl - home losart on hold, continuing home metop  Coronary artery disease, hx cabg No chest pain - cont asa/statin      Subjective: Denies pain, eating breakfast  Physical Exam: Vitals:   11/23/22 1940 11/23/22 2352 11/24/22 0400 11/24/22 0823  BP: (!) 99/54 (!) 111/52 (!) 135/50 (!) 116/54  Pulse: 88 88 91 87  Resp: 18 16 18 18   Temp: 97.6 F (36.4 C) 98.2 F (36.8 C) 97.9 F (36.6 C) 98.6 F (37 C)  TempSrc: Oral Oral Oral   SpO2: 98% 97% 92% 95%  Weight:   95.5 kg   Height:      Patient seen post-op Constitutional:  Vital Signs as per Above [facility] than three noted] No Acute Distress Respiratory:   Respiratory Effort Normal: No Use of Respiratory Muscles,No  Intercostal Retractions             Lungs Clear to Auscultation Bilaterally Cardiovascular:   Heart Auscultated: Regular Regular without any added sounds or murmurs              No Lower Extremity Edema,               Right hip dressing moderately saturated Gastrointestinal:  Abdomen soft and nontender   Psychiatric:  Patient Orientated to Time, Place and Person Patient with appropriate mood and affect Recent and Remote Memory Intact  Data Reviewed: CT-A, Labs as noted  Family Communication: N/A  Disposition: Status is: Inpatient  Planned Discharge Destination: tbd pending PT eval    Time spent: 35 minutes  Author: Silvano Bilis, MD 11/24/2022 8:47 AM  For on call review [email or web address].

## 2022-11-24 NOTE — Progress Notes (Signed)
Subjective: 1 Day Post-Op Procedure(s) (LRB): INTRAMEDULLARY (IM) NAIL INTERTROCHANTERIC (Right) The patient is alert and oriented and sitting up in bed.  His wife is present.  He looks and feels much better than he did preop.  There is moderate dressing drainage which will be changed today.  Hemoglobin was 7.7 this morning and he received 1 unit of packed cells.  Will recheck this tomorrow and he may need 1 more unit.  He got up for a short time.  Patient reports pain as mild.  Objective:   VITALS:   Vitals:   11/24/22 1112 11/24/22 1233  BP: (!) 106/51 (!) 105/49  Pulse: 82 81  Resp: 18 17  Temp: 98.1 F (36.7 C) 97.6 F (36.4 C)  SpO2: 96% 95%    Neurologically intact Sensation intact distally Dorsiflexion/Plantar flexion intact  LABS Recent Labs    11/22/22 1244 11/23/22 0433 11/24/22 0356  HGB 9.9* 8.2* 7.7*  HCT 32.6* 25.0* 25.0*  WBC 22.4* 12.4* 12.6*  PLT 374 289 267    Recent Labs    11/22/22 1928 11/23/22 0433 11/24/22 0356  NA 131* 133* 131*  K 5.0 4.3 4.6  BUN 25* 25* 24*  CREATININE 1.14 0.89 0.89  GLUCOSE 363* 213* 196*    Recent Labs    11/22/22 1244  INR 1.1     Assessment/Plan: 1 Day Post-Op Procedure(s) (LRB): INTRAMEDULLARY (IM) NAIL INTERTROCHANTERIC (Right)   Advance diet Up with therapy Discharge to SNF when available

## 2022-11-24 NOTE — TOC Progression Note (Signed)
Transition of Care [facility]) - Progression Note    Patient Details  Name: Bruce Lucas MRN: [id number] Date of Birth: 05-Aug-1960  [facility] CM/SW Contact  Bing Quarry, RN Phone Number: 11/24/2022, 6:27 PM  Clinical Narrative: 8/18: Pt recommendations are for IPR.   Gabriel Cirri MSN RN CM  Transitions of Care Department [facility] [phone] Weekends Only             Expected Discharge Plan and Services                                               Social Determinants of Health (SDOH) Interventions SDOH Screenings   Food Insecurity: No Food Insecurity (11/23/2022)  Housing: Low Risk  (11/23/2022)  Transportation Needs: No Transportation Needs (11/23/2022)  Utilities: Not At Risk (11/23/2022)  Depression (PHQ2-9): Low Risk  (05/15/2021)  Financial Resource Strain: Low Risk  (08/26/2022)   Received from [facility], [facility]  Social Connections: Unknown (08/17/2021)   Received from [facility], [facility]  Tobacco Use: Medium Risk (11/22/2022)    Readmission Risk Interventions     No data to display

## 2022-11-24 NOTE — Evaluation (Signed)
Physical Therapy Evaluation Patient Details Name: Bruce Lucas MRN: [id number] DOB: 07-30-1960 Today's Date: 11/24/2022  History of Present Illness  Patient is a 62 year old male who presents after fall with right femur fracture s/p IM nail. History of  type 1 diabetes mellitus, heart failure with reduced ejection fraction, hypertension, CVA with residual right-sided weakness, coronary artery disease status post CABG   Clinical Impression  Patient agreeable to PT evaluation and eager to move. He reports he is independent at baseline and lives at home alone. He reports no other falls. No family in the room at time of assessment.  The patient reports moderate pain in the right leg. Assistance required for bed mobility. +2 person assistance required for standing with limited standing tolerance and limited weight acceptance on RLE. Unable to advance to taking steps today. Recommend to continue PT to maximize independence and decrease caregiver burden. Anticipate patient could benefit from intensive therapy after this hospital stay.       If plan is discharge home, recommend the following: A lot of help with walking and/or transfers;A lot of help with bathing/dressing/bathroom;Assist for transportation;Help with stairs or ramp for entrance;Assistance with cooking/housework   Can travel by private vehicle        Equipment Recommendations None recommended by PT  Recommendations for Other Services       Functional Status Assessment Patient has had a recent decline in their functional status and demonstrates the ability to make significant improvements in function in a reasonable and predictable amount of time.     Precautions / Restrictions Precautions Precautions: Fall Restrictions Weight Bearing Restrictions: Yes RLE Weight Bearing: Partial weight bearing RLE Partial Weight Bearing Percentage or Pounds: 50%      Mobility  Bed Mobility Overal bed mobility: Needs Assistance Bed  Mobility: Supine to Sit, Sit to Supine     Supine to sit: Mod assist, HOB elevated, Used rails Sit to supine: Mod assist, HOB elevated, Used rails   General bed mobility comments: verbal cues for technique. increased time and effort required    Transfers Overall transfer level: Needs assistance Equipment used: Rolling walker (2 wheels) Transfers: Sit to/from Stand Sit to Stand: Mod assist, From elevated surface, +2 physical assistance           General transfer comment: verbal cues for hand placement for safety and RLE positioning to maintain weight bearing status and for comfort    Ambulation/Gait             Pre-gait activities: emphasis on off loading RLE with use of rolling walker with standing tolerance of around 1 minute. unable to complete dynamic standing activity General Gait Details: minimal weight acceptance on RLE with standing, limited stnading tolerance for progression of ambulation  Stairs            Wheelchair Mobility     Tilt Bed    Modified Rankin (Stroke Patients Only)       Balance Overall balance assessment: Needs assistance Sitting-balance support: Feet supported Sitting balance-Leahy Scale:  (poor initially progressing to fair) Sitting balance - Comments: initial L side lean with sitting   Standing balance support: Bilateral upper extremity supported Standing balance-Leahy Scale: Poor Standing balance comment: external support required                             Pertinent Vitals/Pain Pain Assessment Pain Assessment: Faces Faces Pain Scale: Hurts even more Pain Location: R leg  Pain Descriptors / Indicators: Discomfort Pain Intervention(s): Limited activity within patient's tolerance, Repositioned, Ice applied    Home Living Family/patient expects to be discharged to:: Private residence Living Arrangements: Alone Available Help at Discharge: Family;Friend(s);Available PRN/intermittently Type of Home:  House Home Access: Other (comment) (one step up, alternal entry with more steps)       Home Layout: One level        Prior Function Prior Level of Function : Independent/Modified Independent             Mobility Comments: denied falls, reports independent with ambulation without assistive device       Extremity/Trunk Assessment   Upper Extremity Assessment Upper Extremity Assessment: Defer to OT evaluation    Lower Extremity Assessment Lower Extremity Assessment: RLE deficits/detail;LLE deficits/detail RLE Deficits / Details: generalized weakness, post-op edema noted. AROM is limited by pain RLE Sensation: history of peripheral neuropathy LLE Sensation: history of peripheral neuropathy       Communication   Communication Communication: No apparent difficulties  Cognition Arousal: Alert Behavior During Therapy: WFL for tasks assessed/performed Overall Cognitive Status: No family/caregiver present to determine baseline cognitive functioning                                 General Comments: patient is grossly oriented. he is able to follow single step commands with increased time. some discrepancy  with history and recent events        General Comments General comments (skin integrity, edema, etc.): patient reports feeling mildly short of breath with mobility    Exercises     Assessment/Plan    PT Assessment Patient needs continued PT services  PT Problem List Decreased strength;Decreased range of motion;Decreased activity tolerance;Decreased balance;Decreased mobility;Decreased safety awareness;Decreased knowledge of precautions;Pain       PT Treatment Interventions Gait training;DME instruction;Stair training;Functional mobility training;Therapeutic activities;Therapeutic exercise;Balance training;Neuromuscular re-education;Cognitive remediation;Patient/family education    PT Goals (Current goals can be found in the Care Plan section)  Acute  Rehab PT Goals Patient Stated Goal: to go to a rehab facility then home PT Goal Formulation: With patient Time For Goal Achievement: 12/08/22 Potential to Achieve Goals: Good    Frequency Min 1X/week     Co-evaluation               AM-PAC PT "6 Clicks" Mobility  Outcome Measure Help needed turning from your back to your side while in a flat bed without using bedrails?: A Lot Help needed moving from lying on your back to sitting on the side of a flat bed without using bedrails?: A Lot Help needed moving to and from a bed to a chair (including a wheelchair)?: Total Help needed standing up from a chair using your arms (e.g., wheelchair or bedside chair)?: Total Help needed to walk in hospital room?: Total Help needed climbing 3-5 steps with a railing? : Total 6 Click Score: 8    End of Session Equipment Utilized During Treatment: Gait belt Activity Tolerance: Patient limited by fatigue Patient left: in bed;with call bell/phone within reach;with bed alarm set Nurse Communication: Mobility status PT Visit Diagnosis: Other abnormalities of gait and mobility (R26.89);Difficulty in walking, not elsewhere classified (R26.2)    Time: 9629-5284 PT Time Calculation (min) (ACUTE ONLY): 26 min   Charges:   PT Evaluation $PT Eval Low Complexity: 1 Low PT Treatments $Therapeutic Activity: 8-22 mins PT General Charges $$ ACUTE PT VISIT: 1 Visit  Donna Bernard, PT, MPT  Ina Homes 11/24/2022, 3:18 PM

## 2022-11-24 NOTE — Evaluation (Signed)
Occupational Therapy Evaluation Patient Details Name: Bruce Lucas MRN: [id number] DOB: Dec 28, 1960 Today's Date: 11/24/2022   History of Present Illness Patient is a 62 year old male who presents after fall with right femur fracture s/p IM nail. History of  type 1 diabetes mellitus, heart failure with reduced ejection fraction, hypertension, CVA with residual right-sided weakness, coronary artery disease status post CABG   Clinical Impression   Patient received for OT evaluation. See flowsheet below for details of function. Generally, patient requiring MOD A for bed mobility, unable to take steps today for functional mobility, and set up-MOD A for ADLs. Patient will benefit from continued OT while in acute care. Pt is very motivated and requires continued therapy to return to prior level of function.        If plan is discharge home, recommend the following: Two people to help with walking and/or transfers;A lot of help with bathing/dressing/bathroom;Assistance with cooking/housework;Direct supervision/assist for medications management;Assist for transportation;Help with stairs or ramp for entrance    Functional Status Assessment  Patient has had a recent decline in their functional status and demonstrates the ability to make significant improvements in function in a reasonable and predictable amount of time.  Equipment Recommendations  Other (comment) (defer to next venue of care)    Recommendations for Other Services Rehab consult     Precautions / Restrictions Precautions Precautions: Fall Restrictions Weight Bearing Restrictions: Yes RLE Weight Bearing: Partial weight bearing RLE Partial Weight Bearing Percentage or Pounds: 50%      Mobility Bed Mobility Overal bed mobility: Needs Assistance Bed Mobility: Supine to Sit, Sit to Supine     Supine to sit: Mod assist, HOB elevated, Used rails Sit to supine: Mod assist, HOB elevated, Used rails   General bed mobility  comments: verbal cues for technique. increased time and effort required    Transfers Overall transfer level: Needs assistance Equipment used: Rolling walker (2 wheels) Transfers: Sit to/from Stand Sit to Stand: Mod assist, +2 physical assistance, From elevated surface           General transfer comment: verbal cues for hand placement for safety and RLE positioning to maintain weight bearing status and for comfort. Pt able to stand for approx 1 minute. Unable to advance feet; pt keeping R foot touchdown WB by choice due to pain. Pt aware of PWB status.      Balance Overall balance assessment: Needs assistance Sitting-balance support: Feet supported Sitting balance-Leahy Scale: Fair Sitting balance - Comments: initial L side lean with sitting   Standing balance support: Bilateral upper extremity supported Standing balance-Leahy Scale: Poor Standing balance comment: external support required                           ADL either performed or assessed with clinical judgement   ADL                                               Vision Baseline Vision/History: 1 Wears glasses Patient Visual Report: No change from baseline       Perception         Praxis         Pertinent Vitals/Pain Pain Assessment Pain Assessment: Faces Faces Pain Scale: Hurts even more Pain Location: R leg Pain Descriptors / Indicators: Discomfort, Sore Pain Intervention(s): Limited activity  within patient's tolerance, Monitored during session     Extremity/Trunk Assessment Upper Extremity Assessment Upper Extremity Assessment: Overall WFL for tasks assessed;Left hand dominant (BIL UE strength WNL and symmetrical in hands and shoulders)   Lower Extremity Assessment Lower Extremity Assessment: Defer to PT evaluation;RLE deficits/detail RLE Deficits / Details: generalized weakness, post-op edema noted. AROM is limited by pain RLE Sensation: history of peripheral  neuropathy LLE Sensation: history of peripheral neuropathy       Communication Communication Communication: No apparent difficulties (some slight extra time needed with answering questions at times.)   Cognition Arousal: Alert Behavior During Therapy: [facility] for tasks assessed/performed Overall Cognitive Status: No family/caregiver present to determine baseline cognitive functioning                                 General Comments: Pt is oriented to person, place, time, and situation. Needs extra time for commands; appears mildly anxious during session. Some discrepancies during giving history (such as pt reporting that when he had his CVA in March 2020 his daughters were 8 and 10 at the time; reports that his daughters are 44 and 20 now, although knows that it is currently 2024). Pt appearing to have decreased connection between his mobility status and his ability to transfer (such as after standing but being unable to take any steps in any direction, pt asking "should I get up to that chair now?")     General Comments  Pt on room air; reporting shortness of breath while seated at EOB; 96% O2 saturation while seated; improved with rest break.    Exercises     Shoulder Instructions      Home Living Family/patient expects to be discharged to:: Private residence Living Arrangements: Alone Available Help at Discharge: Family;Friend(s);Available PRN/intermittently Type of Home: House Home Access: Other (comment) (one step up to front door, alternal entry with more steps)     Home Layout: One level     Bathroom Shower/Tub: Tub/shower unit (pt states he sits down in the tub for bathing or sponge bathes; is afraid to stand in shower for fear of falling.)   Bathroom Toilet: Standard     Home Equipment: Agricultural consultant (2 wheels)          Prior Functioning/Environment Prior Level of Function : Independent/Modified Independent             Mobility Comments: denied  falls (besides the one that led to this hospitalization), reports independent with ambulation without assistive device. Has a RW at home if needed. ADLs Comments: (I) BADLs; drives; enjoys using riding mower and push mower in yard. Endorses CVA in 2020 affecting his R side and unable to do much therapy due to the pandemic just beginning at that time.        OT Problem List: Impaired balance (sitting and/or standing);Decreased safety awareness;Decreased knowledge of use of DME or AE;Decreased activity tolerance      OT Treatment/Interventions: Self-care/ADL training;Therapeutic exercise;Therapeutic activities;Patient/family education;DME and/or AE instruction    OT Goals(Current goals can be found in the care plan section) Acute Rehab OT Goals Patient Stated Goal: Get better OT Goal Formulation: With patient Time For Goal Achievement: 12/08/22 Potential to Achieve Goals: Good ADL Goals Pt Will Perform Grooming: with min assist;standing Pt Will Perform Lower Body Dressing: with min assist;sit to/from stand Pt Will Transfer to Toilet: with min assist;bedside commode Pt Will Perform Toileting - Clothing Manipulation and  hygiene: with min assist;sit to/from stand  OT Frequency: Min 1X/week    Co-evaluation PT/OT/SLP Co-Evaluation/Treatment: Yes Reason for Co-Treatment: To address functional/ADL transfers PT goals addressed during session: Mobility/safety with mobility OT goals addressed during session: Proper use of Adaptive equipment and DME;Strengthening/ROM      AM-PAC OT "6 Clicks" Daily Activity     Outcome Measure Help from another person eating meals?: None Help from another person taking care of personal grooming?: A Little Help from another person toileting, which includes using toliet, bedpan, or urinal?: A Lot Help from another person bathing (including washing, rinsing, drying)?: A Lot Help from another person to put on and taking off regular upper body clothing?: A  Little Help from another person to put on and taking off regular lower body clothing?: Total 6 Click Score: 15   End of Session Equipment Utilized During Treatment: Rolling walker (2 wheels) Nurse Communication: Mobility status  Activity Tolerance: Patient tolerated treatment well;Patient limited by pain Patient left: in bed;with call bell/phone within reach;with bed alarm set  OT Visit Diagnosis: Unsteadiness on feet (R26.81);History of falling (Z91.81)                Time: 7253-6644 OT Time Calculation (min): 27 min Charges:  OT General Charges $OT Visit: 1 Visit OT Evaluation $OT Eval Moderate Complexity: 1 Mod  Latha Staunton Junie Panning, MS, OTR/L  Alvester Morin 11/24/2022, 4:00 PM

## 2022-11-25 ENCOUNTER — Encounter: Payer: Self-pay | Admitting: Specialist

## 2022-11-25 DIAGNOSIS — S728X1A Other fracture of right femur, initial encounter for closed fracture: Secondary | ICD-10-CM | POA: Diagnosis not present

## 2022-11-25 LAB — BASIC METABOLIC PANEL
Anion gap: 8 (ref 5–15)
BUN: 22 mg/dL (ref 8–23)
CO2: 26 mmol/L (ref 22–32)
Calcium: 8.2 mg/dL — ABNORMAL LOW (ref 8.9–10.3)
Chloride: 97 mmol/L — ABNORMAL LOW (ref 98–111)
Creatinine, Ser: 0.87 mg/dL (ref 0.61–1.24)
GFR, Estimated: >60 mL/min (ref >60)
Glucose, Bld: 249 mg/dL — ABNORMAL HIGH (ref 70–99)
Potassium: 4.3 mmol/L (ref 3.5–5.1)
Sodium: 131 mmol/L — ABNORMAL LOW (ref 135–145)

## 2022-11-25 LAB — BPAM RBC
Blood Product Expiration Date: 202409042359
Blood Product Expiration Date: 202409042359
ISSUE DATE / TIME: 202408180922
ISSUE DATE / TIME: 202408181041
Unit Type and Rh: 6200
Unit Type and Rh: 6200

## 2022-11-25 LAB — TYPE AND SCREEN
ABO/RH(D): AB POS
Antibody Screen: NEGATIVE
Unit division: 0
Unit division: 0

## 2022-11-25 LAB — CBC
HCT: 26.9 % — ABNORMAL LOW (ref 39.0–52.0)
Hemoglobin: 8.7 g/dL — ABNORMAL LOW (ref 13.0–17.0)
MCH: 24.2 pg — ABNORMAL LOW (ref 26.0–34.0)
MCHC: 32.3 g/dL (ref 30.0–36.0)
MCV: 74.9 fL — ABNORMAL LOW (ref 80.0–100.0)
Platelets: 245 10*3/uL (ref 150–400)
RBC: 3.59 MIL/uL — ABNORMAL LOW (ref 4.22–5.81)
RDW: 15.1 % (ref 11.5–15.5)
WBC: 9.4 10*3/uL (ref 4.0–10.5)
nRBC: 0 % (ref 0.0–0.2)

## 2022-11-25 LAB — GLUCOSE, CAPILLARY
Glucose-Capillary: 221 mg/dL — ABNORMAL HIGH (ref 70–99)
Glucose-Capillary: 234 mg/dL — ABNORMAL HIGH (ref 70–99)
Glucose-Capillary: 234 mg/dL — ABNORMAL HIGH (ref 70–99)
Glucose-Capillary: 237 mg/dL — ABNORMAL HIGH (ref 70–99)
Glucose-Capillary: 321 mg/dL — ABNORMAL HIGH (ref 70–99)

## 2022-11-25 MED ORDER — INSULIN ASPART 100 UNIT/ML IJ SOLN
5.0000 [IU] | Freq: Three times a day (TID) | INTRAMUSCULAR | Status: DC
  Administered 2022-11-25 – 2022-11-26 (×2): 5 [IU] via SUBCUTANEOUS
  Filled 2022-11-25 (×2): qty 1

## 2022-11-25 MED ORDER — LACTULOSE 10 GM/15ML PO SOLN
30.0000 g | Freq: Once | ORAL | Status: AC
  Administered 2022-11-25: 30 g via ORAL
  Filled 2022-11-25 (×2): qty 60

## 2022-11-25 MED ORDER — HYDROXYZINE HCL 25 MG PO TABS
25.0000 mg | ORAL_TABLET | Freq: Three times a day (TID) | ORAL | Status: DC | PRN
  Administered 2022-11-25 – 2022-11-26 (×2): 25 mg via ORAL
  Filled 2022-11-25 (×2): qty 1

## 2022-11-25 MED ORDER — INSULIN GLARGINE-YFGN 100 UNIT/ML ~~LOC~~ SOLN
16.0000 [IU] | Freq: Every day | SUBCUTANEOUS | Status: DC
  Administered 2022-11-25: 16 [IU] via SUBCUTANEOUS
  Filled 2022-11-25 (×2): qty 0.16

## 2022-11-25 NOTE — Care Management Important Message (Signed)
Important Message  Patient Details  Name: Bruce Lucas MRN: [id number] Date of Birth: 08-03-1960   Medicare Important Message Given:  Yes     [name] 11/25/2022, 11:38 AM

## 2022-11-25 NOTE — Progress Notes (Signed)
Subjective: 2 Days Post-Op Procedure(s) (LRB): INTRAMEDULLARY (IM) NAIL INTERTROCHANTERIC (Right) Patient is out of bed in the chair resting comfortably.  He is wide-awake and alert.  His daughters are present.  His pain is much better.  Dressing remains dry.  Hemoglobin is stable at 8.7.  He will need skilled nursing care following discharge.  Patient reports pain as mild.  Objective:   VITALS:   Vitals:   11/25/22 0818 11/25/22 1246  BP: (!) 142/64 124/65  Pulse: 84 81  Resp: 18 18  Temp: 98.4 F (36.9 C) 97.9 F (36.6 C)  SpO2: 93% 98%    Neurologically intact Intact pulses distally Dorsiflexion/Plantar flexion intact Incision: scant drainage  LABS Recent Labs    11/23/22 0433 11/24/22 0356 11/25/22 0330  HGB 8.2* 7.7* 8.7*  HCT 25.0* 25.0* 26.9*  WBC 12.4* 12.6* 9.4  PLT 289 267 245    Recent Labs    11/23/22 0433 11/24/22 0356 11/25/22 0330  NA 133* 131* 131*  K 4.3 4.6 4.3  BUN 25* 24* 22  CREATININE 0.89 0.89 0.87  GLUCOSE 213* 196* 249*    No results for input(s): "LABPT", "INR" in the last 72 hours.   Assessment/Plan: 2 Days Post-Op Procedure(s) (LRB): INTRAMEDULLARY (IM) NAIL INTERTROCHANTERIC (Right)   Advance diet Up with therapy Discharge to SNF when available

## 2022-11-25 NOTE — Plan of Care (Signed)

## 2022-11-25 NOTE — Progress Notes (Signed)
Occupational Therapy Treatment Patient Details Name: Bruce Lucas MRN: [id number] DOB: 03-Nov-1960 Today's Date: 11/25/2022   History of present illness Patient is a 62 year old male who presents after fall with right femur fracture s/p IM nail. History of  type 1 diabetes mellitus, heart failure with reduced ejection fraction, hypertension, CVA with residual right-sided weakness, coronary artery disease status post CABG   OT comments  Pt received semi-reclined in bed. Appearing alert; premedicated by RN; willing to work with OT on transfer to chair. T/f MOD A x2. See flowsheet below for further details of session. Left with LE elevated in recliner, chair alarm in place, with all needs in reach.  Patient will benefit from continued OT while in acute care.      If plan is discharge home, recommend the following:  Two people to help with walking and/or transfers;A lot of help with bathing/dressing/bathroom;Assistance with cooking/housework;Direct supervision/assist for medications management;Assist for transportation;Help with stairs or ramp for entrance   Equipment Recommendations  Other (comment) (defer to next venue)    Recommendations for Other Services Rehab consult    Precautions / Restrictions Precautions Precautions: Fall Restrictions Weight Bearing Restrictions: Yes RLE Weight Bearing: Partial weight bearing RLE Partial Weight Bearing Percentage or Pounds: 50%       Mobility Bed Mobility Overal bed mobility: Needs Assistance Bed Mobility: Supine to Sit     Supine to sit: Mod assist, +2 for physical assistance     General bed mobility comments: assistance for LE and trunk support to sit upright. cues for task initiation. increased time and effort required    Transfers Overall transfer level: Needs assistance Equipment used: Rolling walker (2 wheels) Transfers: Sit to/from Stand, Bed to chair/wheelchair/BSC Sit to Stand: Mod assist, +2 physical assistance, From  elevated surface Stand pivot transfers: Mod assist, +2 physical assistance, From elevated surface         General transfer comment: patient able to stand once for standing weight (208.1lbs). after seated rest break, patient able to stand pivot from bed to recliner chair with significant increased time and maximal cues for technique. +2 person assistance is required with any standing efforts     Balance Overall balance assessment: Needs assistance Sitting-balance support: Feet supported Sitting balance-Leahy Scale: Fair     Standing balance support: Bilateral upper extremity supported Standing balance-Leahy Scale: Poor Standing balance comment: external support required                           ADL either performed or assessed with clinical judgement   ADL Overall ADL's : Needs assistance/impaired     Grooming: Oral care;Sitting;Set up Grooming Details (indicate cue type and reason): seated in recliner                               General ADL Comments: Pt continues to need significant assistance of +2 for functional transfers; will require extensive assist for LB ADLs. Does well with seated UB ADLs with set up    Extremity/Trunk Assessment Upper Extremity Assessment Upper Extremity Assessment: Overall WFL for tasks assessed   Lower Extremity Assessment Lower Extremity Assessment: Defer to PT evaluation        Vision       Perception     Praxis      Cognition Arousal: Alert Behavior During Therapy: [facility] for tasks assessed/performed Overall Cognitive Status: No family/caregiver present to  determine baseline cognitive functioning                                 General Comments: Follows simple commands well. Limited by anxiety, but reponds well to reassurance. High fear of falling.        Exercises      Shoulder Instructions       General Comments Pt fatigued, but working hard during session. On room air.     Pertinent Vitals/ Pain       Pain Assessment Pain Assessment: Faces Faces Pain Scale: Hurts even more Pain Location: R leg Pain Descriptors / Indicators: Discomfort, Sore Pain Intervention(s): Limited activity within patient's tolerance, Monitored during session, Premedicated before session  Home Living                                          Prior Functioning/Environment              Frequency  Min 1X/week        Progress Toward Goals  OT Goals(current goals can now be found in the care plan section)  Progress towards OT goals: Progressing toward goals  Acute Rehab OT Goals Patient Stated Goal: Get better OT Goal Formulation: With patient Time For Goal Achievement: 12/08/22 Potential to Achieve Goals: Good ADL Goals Pt Will Perform Grooming: with min assist;standing Pt Will Perform Lower Body Dressing: with min assist;sit to/from stand Pt Will Transfer to Toilet: with min assist;bedside commode Pt Will Perform Toileting - Clothing Manipulation and hygiene: with min assist;sit to/from stand  Plan      Co-evaluation    PT/OT/SLP Co-Evaluation/Treatment: Yes Reason for Co-Treatment: To address functional/ADL transfers PT goals addressed during session: Mobility/safety with mobility OT goals addressed during session: Proper use of Adaptive equipment and DME;Strengthening/ROM      AM-PAC OT "6 Clicks" Daily Activity     Outcome Measure   Help from another person eating meals?: None Help from another person taking care of personal grooming?: A Little Help from another person toileting, which includes using toliet, bedpan, or urinal?: A Lot Help from another person bathing (including washing, rinsing, drying)?: A Lot Help from another person to put on and taking off regular upper body clothing?: A Little Help from another person to put on and taking off regular lower body clothing?: Total 6 Click Score: 15    End of Session Equipment  Utilized During Treatment: Rolling walker (2 wheels);Gait belt  OT Visit Diagnosis: Unsteadiness on feet (R26.81);History of falling (Z91.81)   Activity Tolerance Patient tolerated treatment well;Patient limited by pain   Patient Left in chair;with call bell/phone within reach;with chair alarm set   Nurse Communication Mobility status        Time: 1610-9604 OT Time Calculation (min): 23 min  Charges: OT General Charges $OT Visit: 1 Visit OT Treatments $Therapeutic Activity: 8-22 mins  Linward Foster, MS, OTR/L  Alvester Morin 11/25/2022, 1:03 PM

## 2022-11-25 NOTE — Progress Notes (Signed)
Physical Therapy Treatment Patient Details Name: Bruce Lucas MRN: [id number] DOB: [birth year] Today's Date: 11/25/2022   History of Present Illness Patient is a 62 year old male who presents after fall with right femur fracture s/p IM nail. History of  type 1 diabetes mellitus, heart failure with reduced ejection fraction, hypertension, CVA with residual right-sided weakness, coronary artery disease status post CABG    PT Comments  Patient is agreeable to PT. He was eager to get up to the chair. With +2 person assistance, patient progressed to stand pivot transfer to chair using rolling walker and maximal cues for technique. Minimal weight acceptance on RLE (less than 50%) secondary to pain. Unable to progress walking this session. Recommend to continue PT to maximize independence and facilitate return to prior level of function.     If plan is discharge home, recommend the following: A lot of help with walking and/or transfers;A lot of help with bathing/dressing/bathroom;Assist for transportation;Help with stairs or ramp for entrance;Assistance with cooking/housework   Can travel by private vehicle        Equipment Recommendations  None recommended by PT    Recommendations for Other Services       Precautions / Restrictions Precautions Precautions: Fall Restrictions Weight Bearing Restrictions: Yes RLE Weight Bearing: Partial weight bearing RLE Partial Weight Bearing Percentage or Pounds: 50%     Mobility  Bed Mobility Overal bed mobility: Needs Assistance Bed Mobility: Supine to Sit     Supine to sit: Mod assist, +2 for physical assistance     General bed mobility comments: assistance for LE and trunk support to sit upright. cues for task initiation. increased time and effort required    Transfers Overall transfer level: Needs assistance Equipment used: Rolling walker (2 wheels) Transfers: Sit to/from Stand, Bed to chair/wheelchair/BSC Sit to Stand: Mod assist, +2  physical assistance, From elevated surface Stand pivot transfers: Mod assist, +2 physical assistance, From elevated surface         General transfer comment: patient able to stand once for standing weight (208.1lbs). after seated rest break, patient able to stand pivot from bed to recliner chair with significant increased time and maximal cues for technique. +2 person assistance is required with any standing efforts    Ambulation/Gait             Pre-gait activities: minimal weight bearing in standing on RLE. patient prefers TTWB for comfort. emphasis on off loading using rolling walker and upper body strength. standing tolerance still around one minute General Gait Details: unable to progress walking this date due to poor standing tolerance.   Stairs             Wheelchair Mobility     Tilt Bed    Modified Rankin (Stroke Patients Only)       Balance Overall balance assessment: Needs assistance Sitting-balance support: Feet supported Sitting balance-Leahy Scale: Fair     Standing balance support: Bilateral upper extremity supported Standing balance-Leahy Scale: Poor Standing balance comment: external support required                            Cognition Arousal: Alert Behavior During Therapy: WFL for tasks assessed/performed Overall Cognitive Status: No family/caregiver present to determine baseline cognitive functioning                                 General Comments:  patient is able to follow single step commands consistently        Exercises      General Comments General comments (skin integrity, edema, etc.): mild dyspnea with exertion. encouraged ankle pumps and ROM of RLE while seated in chair      Pertinent Vitals/Pain Pain Assessment Pain Assessment: Faces Faces Pain Scale: Hurts even more Pain Location: R leg Pain Descriptors / Indicators: Discomfort, Sore Pain Intervention(s): Limited activity within patient's  tolerance, Monitored during session, Premedicated before session, Repositioned, Ice applied    Home Living                          Prior Function            PT Goals (current goals can now be found in the care plan section) Acute Rehab PT Goals Patient Stated Goal: to go to a rehab facility then home PT Goal Formulation: With patient Time For Goal Achievement: 12/08/22 Potential to Achieve Goals: Good Progress towards PT goals: Progressing toward goals    Frequency    Min 1X/week      PT Plan      Co-evaluation PT/OT/SLP Co-Evaluation/Treatment: Yes Reason for Co-Treatment: To address functional/ADL transfers PT goals addressed during session: Mobility/safety with mobility        AM-PAC PT "6 Clicks" Mobility   Outcome Measure  Help needed turning from your back to your side while in a flat bed without using bedrails?: A Lot Help needed moving from lying on your back to sitting on the side of a flat bed without using bedrails?: A Lot Help needed moving to and from a bed to a chair (including a wheelchair)?: Total Help needed standing up from a chair using your arms (e.g., wheelchair or bedside chair)?: Total Help needed to walk in hospital room?: Total Help needed climbing 3-5 steps with a railing? : Total 6 Click Score: 8    End of Session Equipment Utilized During Treatment: Gait belt Activity Tolerance: Patient tolerated treatment well Patient left: in chair;with call bell/phone within reach;with chair alarm set Nurse Communication: Mobility status PT Visit Diagnosis: Other abnormalities of gait and mobility (R26.89);Difficulty in walking, not elsewhere classified (R26.2)     Time: 7829-5621 PT Time Calculation (min) (ACUTE ONLY): 23 min  Charges:    $Therapeutic Activity: 8-22 mins PT General Charges $$ ACUTE PT VISIT: 1 Visit                     Donna Bernard, PT, MPT    Ina Homes 11/25/2022, 10:42 AM

## 2022-11-25 NOTE — Inpatient Diabetes Management (Signed)
Inpatient Diabetes Program Recommendations  AACE/ADA: New Consensus Statement on Inpatient Glycemic Control (2015)  Target Ranges:  Prepandial:   less than 140 mg/dL      Peak postprandial:   less than 180 mg/dL (1-2 hours)      Critically ill patients:  140 - 180 mg/dL   Lab Results  Component Value Date   GLUCAP 321 (H) 11/25/2022   HGBA1C 6.9 (H) 11/22/2022    Latest Reference Range & Units 11/24/22 08:22 11/24/22 11:11 11/24/22 15:56 11/24/22 21:31 11/25/22 00:01 11/25/22 08:18 11/25/22 11:58  Glucose-Capillary 70 - 99 mg/dL 130 (H) 865 (H) 784 (H) 179 (H) 234 (H) 237 (H) 321 (H)  (H): Data is abnormally high  Review of Glycemic Control  Diabetes history: DM1 Outpatient Diabetes medications: Lantus 19 units at bedtime, Novolog 8-14 TID  Current orders for Inpatient glycemic control: Semglee 12 units at bedtime, Novolog 3 units tid meal coverage, Novolog 0-9 units tid correction  Inpatient Diabetes Program Recommendations:  Please consider: -Increase Semglee to 15 units at bedtime -Increase Novolog meal coverage to 5 units tid if eats 50% meals  Per office note with endocrinologist Dr. Tedd Sias on 08/26/22.  "Bruce Lucas is 62 y.o. male seen in follow up for type 1 diabetes. His Hb A1c is 7%. He is now using a [employer] 2 CGM. He forgot to bring in the [place] reader so there are no blood glucose readings to review. He asks to change to the [place] 3 and requests a new reader as well.  He takes for diabetes: Lantus insulin 15 units qhs Fiasp insulin Breakfast 4 units if sugar is 70 - 120 5 units if sugar is 121 - 160 6 units if sugar is 161 - 200 7 units if sugar is 201 - 240 8 units if sugar is 241 - 280 9 units if sugar is over 241  Lunch and supper 6 units if sugar is 70 - 120 8 units if sugar is 121 - 160 10 units if sugar is 161 - 200 12 units if sugar is 201 - 240 14 units if sugar is 241 or higher"   Thank you, Darel Hong E. Amery Minasyan, RN, MSN, CDE  Diabetes  Coordinator Inpatient Glycemic Control Team Team Pager [phone] (8am-5pm) 11/25/2022 12:29 PM

## 2022-11-25 NOTE — TOC Progression Note (Signed)
Transition of Care [facility]) - Progression Note    Patient Details  Name: Bruce Lucas MRN: [id number] Date of Birth: 07/16/1960  Transition of Care [facility]) CM/SW Contact  Truddie Hidden, RN Phone Number: 11/25/2022, 4:00 PM  Clinical Narrative:    Attempt to speak with patient regarding discharge plan.Will follow up at later time as he was using the rest room.          Expected Discharge Plan and Services                                               Social Determinants of Health (SDOH) Interventions SDOH Screenings   Food Insecurity: No Food Insecurity (11/23/2022)  Housing: Low Risk  (11/23/2022)  Transportation Needs: No Transportation Needs (11/23/2022)  Utilities: Not At Risk (11/23/2022)  Depression (PHQ2-9): Low Risk  (05/15/2021)  Financial Resource Strain: Low Risk  (08/26/2022)   [facility], [facility]  Social Connections: Unknown (08/17/2021)   Received from [facility], [facility]  Tobacco Use: Medium Risk (11/22/2022)    Readmission Risk Interventions     No data to display

## 2022-11-25 NOTE — NC FL2 (Signed)
[place] MEDICAID FL2 LEVEL OF CARE FORM     IDENTIFICATION  Patient Name: Bruce Lucas Birthdate: April 01, 1961 Sex: male Admission Date (Current Location): 11/22/2022  [facility] and [place] Number:  Chiropodist and Address:  [facility], [address] [place], [place] [address]      Provider Number: [id number]  Attending Physician Name and Address:  Kathrynn Running, MD  Relative Name and Phone Number:  Kaveh Blowers [phone]    Current Level of Care: Hospital Recommended Level of Care: Skilled Nursing Facility Prior Approval Number:    Date Approved/Denied:   PASRR Number: [id number]  Discharge Plan: SNF    Current Diagnoses: Patient Active Problem List   Diagnosis Date Noted   Sepsis (HCC) 11/23/2022   Hyponatremia 11/23/2022   T1DM (type 1 diabetes mellitus) (HCC) 11/23/2022   AKI (acute kidney injury) (HCC) 11/23/2022   Peripheral arterial disease (HCC) 11/23/2022   Normocytic anemia 11/23/2022   Secondary hypercoagulable state (HCC) 11/23/2022   Femoral fracture (HCC) 11/22/2022   HFrEF (heart failure with reduced ejection fraction) (HCC) 02/21/2021   Chronic venous insufficiency 07/15/2019   Myalgia due to statin 04/12/2019   Radicular leg pain 12/09/2018   Atherosclerosis of native arteries of extremity with intermittent claudication (HCC) 12/05/2018   Hyperlipidemia 12/05/2018   Diabetic retinopathy associated with type 1 diabetes mellitus (HCC) 10/30/2018   Healthcare maintenance 10/07/2018   Coronary artery disease involving native coronary artery of native heart without angina pectoris 09/03/2018   CAD, multiple vessel 06/15/2018   Ischemic cardiomyopathy 06/15/2018   NSTEMI (non-ST elevated myocardial infarction) (HCC) 06/15/2018   Essential hypertension 06/14/2018   Right to left cardiac shunt 06/14/2018   Tobacco abuse 06/14/2018   Stroke (HCC) 06/13/2018   ED (erectile dysfunction) 04/24/2017    CELLULITIS, HAND, LEFT 11/14/2010    Orientation RESPIRATION BLADDER Height & Weight     Self, Time, Situation, Place  Normal Continent Weight: 94.4 kg Height:  5\' 11"  (180.3 cm)  BEHAVIORAL SYMPTOMS/MOOD NEUROLOGICAL BOWEL NUTRITION STATUS  Other (Comment)   Continent Diet (Carb modified)  AMBULATORY STATUS COMMUNICATION OF NEEDS Skin   Limited Assist Verbally Other (Comment), Skin abrasions (L hip incision, Left toe abrasion)                       Personal Care Assistance Level of Assistance  Bathing, Dressing Bathing Assistance: Limited assistance   Dressing Assistance: Limited assistance     Functional Limitations Info             SPECIAL CARE FACTORS FREQUENCY  PT (By licensed PT)     PT Frequency: Min 2x weekly              Contractures Contractures Info: Not present    Additional Factors Info  Code Status, Allergies Code Status Info: FULL Allergies Info: Codeine, Penicillins           Current Medications (11/25/2022):  This is the current hospital active medication list Current Facility-Administered Medications  Medication Dose Route Frequency Provider Last Rate Last Admin   0.9 %  sodium chloride infusion (Manually program via Guardrails IV Fluids)   Intravenous Once Deeann Saint, MD       acetaminophen (TYLENOL) tablet 325-650 mg  325-650 mg Oral Q6H PRN Deeann Saint, MD       alum & mag hydroxide-simeth (MAALOX/MYLANTA) 200-200-20 MG/5ML suspension 30 mL  30 mL Oral Q4H PRN Deeann Saint, MD  aspirin EC tablet 81 mg  81 mg Oral Daily Core, Doy Hutching, MD   81 mg at 11/25/22 0919   atorvastatin (LIPITOR) tablet 40 mg  40 mg Oral Daily Core, Doy Hutching, MD   40 mg at 11/25/22 0919   bisacodyl (DULCOLAX) suppository 10 mg  10 mg Rectal Daily PRN Deeann Saint, MD       cyanocobalamin (VITAMIN B12) tablet 1,000 mcg  1,000 mcg Oral Daily Deeann Saint, MD   1,000 mcg at 11/25/22 1478   DULoxetine (CYMBALTA) DR capsule 30 mg  30 mg Oral Daily  Deeann Saint, MD   30 mg at 11/25/22 0919   enoxaparin (LOVENOX) injection 30 mg  30 mg Subcutaneous Q24H Deeann Saint, MD   30 mg at 11/25/22 0919   feeding supplement (ENSURE ENLIVE / ENSURE PLUS) liquid 237 mL  237 mL Oral TID BM Core, Doy Hutching, MD   237 mL at 11/24/22 1200   ferrous sulfate tablet 325 mg  325 mg Oral Q breakfast Deeann Saint, MD   325 mg at 11/25/22 0918   HYDROcodone-acetaminophen (NORCO) 7.5-325 MG per tablet 1-2 tablet  1-2 tablet Oral Q4H PRN Deeann Saint, MD   2 tablet at 11/24/22 2128   HYDROcodone-acetaminophen (NORCO/VICODIN) 5-325 MG per tablet 1-2 tablet  1-2 tablet Oral Q4H PRN Deeann Saint, MD   2 tablet at 11/25/22 0918   insulin aspart (novoLOG) injection 0-9 Units  0-9 Units Subcutaneous TID WC Wouk, Wilfred Curtis, MD   7 Units at 11/25/22 1249   insulin aspart (novoLOG) injection 5 Units  5 Units Subcutaneous TID WC Wouk, Wilfred Curtis, MD       insulin glargine-yfgn [facility]) injection 16 Units  16 Units Subcutaneous QHS Wouk, Wilfred Curtis, MD       magnesium hydroxide (MILK OF MAGNESIA) suspension 30 mL  30 mL Oral Daily PRN Deeann Saint, MD       melatonin tablet 10 mg  10 mg Oral Mariel Kansky, MD   10 mg at 11/24/22 2128   menthol-cetylpyridinium (CEPACOL) lozenge 3 mg  1 lozenge Oral PRN Deeann Saint, MD       Or   phenol (CHLORASEPTIC) mouth spray 1 spray  1 spray Mouth/Throat PRN Deeann Saint, MD       metoCLOPramide (REGLAN) tablet 5-10 mg  5-10 mg Oral Q8H PRN Deeann Saint, MD       Or   metoCLOPramide (REGLAN) injection 5-10 mg  5-10 mg Intravenous Q8H PRN Deeann Saint, MD       metoprolol succinate (TOPROL-XL) 24 hr tablet 12.5 mg  12.5 mg Oral Daily Deeann Saint, MD   12.5 mg at 11/25/22 0916   morphine (PF) 2 MG/ML injection 0.5-1 mg  0.5-1 mg Intravenous Q2H PRN Deeann Saint, MD       multivitamin with minerals tablet 1 tablet  1 tablet Oral Daily Core, Doy Hutching, MD   1 tablet at 11/25/22 0919   ondansetron (ZOFRAN)  tablet 4 mg  4 mg Oral Q6H PRN Deeann Saint, MD       Or   ondansetron [facility]) injection 4 mg  4 mg Intravenous Q6H PRN Deeann Saint, MD       polyethylene glycol (MIRALAX / GLYCOLAX) packet 17 g  17 g Oral Daily Kathrynn Running, MD   17 g at 11/25/22 2956   senna (SENOKOT) tablet 8.6 mg  1 tablet Oral BID Deeann Saint, MD   8.6 mg at 11/25/22 0919   sodium  phosphate (FLEET) enema 1 enema  1 enema Rectal Once PRN Deeann Saint, MD       traZODone (DESYREL) tablet 50 mg  50 mg Oral QHS Deeann Saint, MD   50 mg at 11/24/22 2128     Discharge Medications: Please see discharge summary for a list of discharge medications.  Relevant Imaging Results:  Relevant Lab Results:   Additional Information SS# [id number]  Truddie Hidden, RN

## 2022-11-25 NOTE — Progress Notes (Signed)
Physical Therapy Treatment Patient Details Name: Bruce Lucas MRN: [id number] DOB: 1960/10/05 Today's Date: 11/25/2022   History of Present Illness Patient is a 62 year old male who presents after fall with right femur fracture s/p IM nail. History of  type 1 diabetes mellitus, heart failure with reduced ejection fraction, hypertension, CVA with residual right-sided weakness, coronary artery disease status post CABG    PT Comments  Patient is agreeable to PT session. Patient seen for second time in attempts to progress mobility and maximize functional independence.  Patient declined getting back to bed and wanted to stay up in the chair at end of session. Two supportive daughters present throughout session. Increased independence with transfers this session with only one person assistance required for standing from the recliner chair. Increased weight acceptance (no more than 50%) on RLE with standing using rolling walker for support and patient is able to achieve flat foot on the right in preparation for gait training. Limited standing tolerance for progression of ambulation. Recommend to continue PT to maximize independence.    If plan is discharge home, recommend the following: A lot of help with walking and/or transfers;A lot of help with bathing/dressing/bathroom;Assist for transportation;Help with stairs or ramp for entrance;Assistance with cooking/housework   Can travel by private vehicle        Equipment Recommendations  None recommended by PT    Recommendations for Other Services       Precautions / Restrictions Precautions Precautions: Fall Restrictions Weight Bearing Restrictions: Yes RLE Weight Bearing: Partial weight bearing RLE Partial Weight Bearing Percentage or Pounds: 50%     Mobility  Bed Mobility Overal bed mobility: Needs Assistance Bed Mobility: Supine to Sit     Supine to sit: Mod assist, +2 for physical assistance     General bed mobility comments:  not assessed    Transfers Overall transfer level: Needs assistance Equipment used: Rolling walker (2 wheels) Transfers: Sit to/from Stand Sit to Stand: Max assist Stand pivot transfers: Mod assist, +2 physical assistance, From elevated surface         General transfer comment: verbal cues for hand placement for technique for standing. second person close by for safety but not required for sit to stand transfer from recliner chair. patient declined getting back to bed at this time and requested to remain sitting up in chair    Ambulation/Gait             Pre-gait activities: emphasis on upright standing posture. patient is able to accept increased weight on RLE while standing with foot flat on the ground which is an improvement from earlier session General Gait Details: unable to progress walking, poor standing tolerance   Stairs             Wheelchair Mobility     Tilt Bed    Modified Rankin (Stroke Patients Only)       Balance Overall balance assessment: Needs assistance Sitting-balance support: Feet supported Sitting balance-Leahy Scale: Fair     Standing balance support: Bilateral upper extremity supported Standing balance-Leahy Scale: Poor Standing balance comment: external support required, standing tolerance of around 1 minute                            Cognition Arousal: Alert Behavior During Therapy: WFL for tasks assessed/performed Overall Cognitive Status: Within Functional Limits for tasks assessed  General Comments: patient is able to follow single step commands consistently        Exercises      General Comments General comments (skin integrity, edema, etc.): Pt fatigued, but working hard during session. On room air.      Pertinent Vitals/Pain Pain Assessment Pain Assessment: Faces Faces Pain Scale: Hurts even more Pain Location: R leg Pain Descriptors / Indicators:  Discomfort, Sore Pain Intervention(s): Limited activity within patient's tolerance, Monitored during session, Repositioned, Ice applied (new ice pack applied R thigh/knee area)    Home Living                          Prior Function            PT Goals (current goals can now be found in the care plan section) Acute Rehab PT Goals Patient Stated Goal: to go to a rehab facility then home PT Goal Formulation: With patient Time For Goal Achievement: 12/08/22 Potential to Achieve Goals: Good Progress towards PT goals: Progressing toward goals    Frequency    Min 1X/week      PT Plan      Co-evaluation   Reason for Co-Treatment: To address functional/ADL transfers   OT goals addressed during session: Proper use of Adaptive equipment and DME;Strengthening/ROM      AM-PAC PT "6 Clicks" Mobility   Outcome Measure  Help needed turning from your back to your side while in a flat bed without using bedrails?: A Lot Help needed moving from lying on your back to sitting on the side of a flat bed without using bedrails?: A Lot Help needed moving to and from a bed to a chair (including a wheelchair)?: Total Help needed standing up from a chair using your arms (e.g., wheelchair or bedside chair)?: Total Help needed to walk in hospital room?: Total Help needed climbing 3-5 steps with a railing? : Total 6 Click Score: 8    End of Session   Activity Tolerance: Patient tolerated treatment well Patient left: in chair;with call bell/phone within reach;with chair alarm set Nurse Communication: Mobility status (mobility nurse tech in the room during session) PT Visit Diagnosis: Other abnormalities of gait and mobility (R26.89);Difficulty in walking, not elsewhere classified (R26.2)     Time: 1610-9604 PT Time Calculation (min) (ACUTE ONLY): 10 min  Charges:    $Therapeutic Activity: 8-22 mins PT General Charges $$ ACUTE PT VISIT: 1 Visit                    Donna Bernard, PT, MPT    Ina Homes 11/25/2022, 3:27 PM

## 2022-11-25 NOTE — Progress Notes (Signed)
  Progress Note   Patient: Bruce Lucas AOZ:[id number] DOB: 1960/07/22 DOA: 11/22/2022     3 DOS: the patient was seen and examined on 11/25/2022   Brief hospital course: 62 year old gentleman with a past medical history of type 1 diabetes mellitus, heart failure with reduced ejection fraction, hypertension, CVA with residual right-sided weakness, coronary artery disease status post CABG who presented to the emergency department after he fell off the toilet hitting his right lower extremity.  He is now status post IM nailing of his right lower extremity.  Presented with significant lactic acid elevation and leukocytosis, has improved with volume expansion. Found to have PAD on CT-A.  Assessment and Plan: * Femoral fracture (HCC) Right hip fracture Status post IM nailing,  Pt/ot consulted advising snf Pain control Up bowel regimen, no bm yet post surgery, will give lactulose  Blood loss anemia 9.9 on presentation, 7.7 yesterday after surgery, appropriate rise to 8.7 today with one unit on 8/18 - monitor  Peripheral arterial disease (HCC) Abnormal CTA here, evaluated by vascular, as is asymtpomatic and wound-free they advise no additional intervention save for continuing medical mgmt (asa, statin, etc.)  AKI (acute kidney injury) (HCC) Resolved w/ fluids, tolerating po - stop fluids  Type 1 Diabetes mellitus (HCC) Hyperglycemic today - will increase basal/bolus, continue ssi  HFrecoveredEF (HCC) Appears euvolemic - cont home metop  Hyperlipidemia Hyperlipidemia Change simvastatin 5 mg to atorvastatin 40 mg  Essential hypertension Bps wnl - home losart on hold, continuing home metop  Coronary artery disease, hx cabg No chest pain - cont asa/statin      Subjective: moderate pain, no complaints  Physical Exam: Vitals:   11/25/22 0418 11/25/22 0818 11/25/22 1005 11/25/22 1246  BP: 122/63 (!) 142/64  124/65  Pulse: 88 84  81  Resp: 18 18  18   Temp: 98.4 F (36.9  C) 98.4 F (36.9 C)  97.9 F (36.6 C)  TempSrc:  Oral    SpO2: 93% 93%  98%  Weight:   94.4 kg   Height:      Patient seen post-op Constitutional:  Vital Signs as per [facility] than three noted] No Acute Distress Respiratory:   Respiratory Effort Normal: No Use of Respiratory Muscles,No  Intercostal Retractions             Lungs Clear to Auscultation Bilaterally Cardiovascular:   Heart Auscultated: Regular Regular without any added sounds or murmurs              No Lower Extremity Edema,               Right hip dressing moderately saturated Gastrointestinal:  Abdomen soft and nontender   Psychiatric:  Patient Orientated to Time, Place and Person Patient with appropriate mood and affect Recent and Remote Memory Intact   Family Communication: daughter updated @ bedside  Disposition: Status is: Inpatient  Planned Discharge Destination: snf    Time spent: 25 minutes  Author: Silvano Bilis, MD 11/25/2022 2:20 PM  For on call review [email or web address].

## 2022-11-26 DIAGNOSIS — S728X1A Other fracture of right femur, initial encounter for closed fracture: Secondary | ICD-10-CM | POA: Diagnosis not present

## 2022-11-26 LAB — CBC
HCT: 27.6 % — ABNORMAL LOW (ref 39.0–52.0)
Hemoglobin: 8.8 g/dL — ABNORMAL LOW (ref 13.0–17.0)
MCH: 24.1 pg — ABNORMAL LOW (ref 26.0–34.0)
MCHC: 31.9 g/dL (ref 30.0–36.0)
MCV: 75.6 fL — ABNORMAL LOW (ref 80.0–100.0)
Platelets: 273 K/uL (ref 150–400)
RBC: 3.65 MIL/uL — ABNORMAL LOW (ref 4.22–5.81)
RDW: 15.1 % (ref 11.5–15.5)
WBC: 8.6 K/uL (ref 4.0–10.5)
nRBC: 0.2 % (ref 0.0–0.2)

## 2022-11-26 LAB — GLUCOSE, CAPILLARY
Glucose-Capillary: 244 mg/dL — ABNORMAL HIGH (ref 70–99)
Glucose-Capillary: 333 mg/dL — ABNORMAL HIGH (ref 70–99)
Glucose-Capillary: 233 mg/dL — ABNORMAL HIGH (ref 70–99)
Glucose-Capillary: 152 mg/dL — ABNORMAL HIGH (ref 70–99)

## 2022-11-26 LAB — BASIC METABOLIC PANEL WITH GFR
Anion gap: 7 (ref 5–15)
BUN: 16 mg/dL (ref 8–23)
CO2: 26 mmol/L (ref 22–32)
Calcium: 8.1 mg/dL — ABNORMAL LOW (ref 8.9–10.3)
Chloride: 98 mmol/L (ref 98–111)
Creatinine, Ser: 0.71 mg/dL (ref 0.61–1.24)
GFR, Estimated: >60 mL/min
Glucose, Bld: 231 mg/dL — ABNORMAL HIGH (ref 70–99)
Potassium: 3.7 mmol/L (ref 3.5–5.1)
Sodium: 131 mmol/L — ABNORMAL LOW (ref 135–145)

## 2022-11-26 MED ORDER — FERROUS SULFATE 325 (65 FE) MG PO TABS: 325.0000 mg | ORAL_TABLET | ORAL | Status: DC

## 2022-11-26 MED ORDER — INSULIN ASPART 100 UNIT/ML IJ SOLN
8.0000 [IU] | Freq: Three times a day (TID) | INTRAMUSCULAR | Status: DC
  Administered 2022-11-26 – 2022-11-28 (×7): 8 [IU] via SUBCUTANEOUS
  Filled 2022-11-26 (×7): qty 1

## 2022-11-26 MED ORDER — INSULIN GLARGINE-YFGN 100 UNIT/ML ~~LOC~~ SOLN
24.0000 [IU] | Freq: Every day | SUBCUTANEOUS | Status: DC
  Administered 2022-11-26 – 2022-11-27 (×2): 24 [IU] via SUBCUTANEOUS
  Filled 2022-11-26 (×4): qty 0.24

## 2022-11-26 MED ORDER — FERROUS SULFATE 325 (65 FE) MG PO TABS
325.0000 mg | ORAL_TABLET | ORAL | Status: DC
  Administered 2022-11-27: 325 mg via ORAL
  Filled 2022-11-26: qty 1

## 2022-11-26 NOTE — Progress Notes (Signed)
  Progress Note   Patient: Bruce Lucas WRU:[id number] DOB: 1961/02/15 DOA: 11/22/2022     4 DOS: the patient was seen and examined on 11/26/2022   Brief hospital course: 62 year old gentleman with a past medical history of type 1 diabetes mellitus, heart failure with reduced ejection fraction, hypertension, CVA with residual right-sided weakness, coronary artery disease status post CABG who presented to the emergency department after he fell off the toilet hitting his right lower extremity.  He is now status post IM nailing of his right lower extremity.  Presented with significant lactic acid elevation and leukocytosis, has improved with volume expansion. Found to have PAD on CT-A.  Assessment and Plan: * Femoral intertrochanteric fracture (HCC) Right hip fracture Status post IM nailing,  Pt/ot consulted advising snf, TOC working on this Pain control Continue bowel regimen, had BM last night  Blood loss anemia 9.9 on presentation, 7.7 yesterday after surgery, appropriate rise to mid-8s ever since - iron  Peripheral arterial disease (HCC) Abnormal CTA here, evaluated by vascular, as is asymtpomatic and wound-free they advise no additional intervention save for continuing medical mgmt (asa, statin, etc.)  AKI (acute kidney injury) (HCC) Resolved w/ fluids, tolerating po - monitor off fluids  Type 1 Diabetes mellitus (HCC) Hyperglycemic today - will increase basal/bolus, continue ssi  HFrecoveredEF (HCC) Appears euvolemic - cont home metop  Hyperlipidemia Hyperlipidemia Change simvastatin 5 mg to atorvastatin 40 mg  Essential hypertension Bps wnl - home losart on hold, continuing home metop  Coronary artery disease, hx cabg No chest pain - cont asa/statin      Subjective: pain improving, bm last night, tolerating diet  Physical Exam: Vitals:   11/26/22 0000 11/26/22 0409 11/26/22 0813 11/26/22 1232  BP: 131/64  135/62 123/64  Pulse: 79  91 85  Resp: 18  19 19    Temp: 97.9 F (36.6 C)  97.6 F (36.4 C) 98.3 F (36.8 C)  TempSrc: Oral  Oral   SpO2: 96%  98% 99%  Weight:  99.1 kg    Height:      Patient seen post-op Constitutional:  Vital Signs as per [facility] than three noted] No Acute Distress Respiratory:   Respiratory Effort Normal: No Use of Respiratory Muscles,No  Intercostal Retractions             Lungs Clear to Auscultation Bilaterally Cardiovascular:   Heart Auscultated: Regular Regular without any added sounds or murmurs              No Lower Extremity Edema,               Right hip dressing moderately saturated, unchanged Gastrointestinal:  Abdomen soft and nontender   Psychiatric:  Patient Orientated to Time, Place and Person Patient with appropriate mood and affect Recent and Remote Memory Intact   Family Communication: daughter updated @ bedside 8/19, none at bedside today  Disposition: Status is: Inpatient  Planned Discharge Destination: snf    Time spent: 25 minutes  Author: Silvano Bilis, MD 11/26/2022 12:37 PM  For on call review [email or web address].

## 2022-11-26 NOTE — Progress Notes (Signed)
Patient transferred by bed to room 146 with Marylene Land, NT. Alert with no distress noted when leaving 2A.

## 2022-11-26 NOTE — TOC Progression Note (Signed)
Transition of Care [facility]) - Progression Note    Patient Details  Name: Bruce Lucas MRN: [id number] Date of Birth: [birth year]  [facility]) CM/SW Contact  Truddie Hidden, RN Phone Number: 11/26/2022, 3:09 PM  Clinical Narrative:    Spoke with patient at bedside regarding therapy recommendation. He is agreeable to SNF and does not have a preference. He would like to stay local. He was advised an Berkley Harvey would still be required.  Bed search initiated.          Expected Discharge Plan and Services                                               Social Determinants of Health (SDOH) Interventions SDOH Screenings   Food Insecurity: No Food Insecurity (11/23/2022)  Housing: Low Risk  (11/23/2022)  Transportation Needs: No Transportation Needs (11/23/2022)  Utilities: Not At Risk (11/23/2022)  Depression (PHQ2-9): Low Risk  (05/15/2021)  Financial Resource Strain: Low Risk  (08/26/2022)   Received from [facility], [facility]  Social Connections: Unknown (08/17/2021)   Received from [facility], [facility]  Tobacco Use: Medium Risk (11/22/2022)    Readmission Risk Interventions     No data to display

## 2022-11-26 NOTE — Progress Notes (Signed)
Nutrition Follow-up  DOCUMENTATION CODES:   Not applicable  INTERVENTION:   -Continue MVI with minerals daily -D/c Ensure Enlive po TID, each supplement provides 350 kcal and 20 grams of protein -Continue carb modified diet  NUTRITION DIAGNOSIS:   Increased nutrient needs related to post-op healing as evidenced by estimated needs.  Ongoing  GOAL:   Patient will meet greater than or equal to 90% of their needs  Progressing   MONITOR:   PO intake, Supplement acceptance, Labs, Weight trends, Skin, I & O's  REASON FOR ASSESSMENT:   Consult Hip fracture protocol  ASSESSMENT:   62 y/o male with h/o IDDM, HFrEF, hypertension, CVA with right-sided residual deficits, CAD s/p CABG (2020) and HLD who is admitted with hip fracture after fall.  8/17- s/p PROCEDURE:  INTRAMEDULLARY (IM) NAIL INTERTROCHANTERIC right hip   Reviewed I/O's: -201 ml x 24 hours and +2.1 L since admission  UOP: 801 ml x 24 hours  Spoke with pt, who was sitting in recliner chair. Pt is pleasant and in good spirits and reports feeling better each day. Pt has a good appetite, consumed 100% of his breakfast. Noted meal completions 60-100%.   Pt reports good appetite PTA. Pt shares that he grazes all day and has his DM "down to a science". He uses insulin and CGM to help manage his DM. He shares with this RD that he was diagnosed with type 1 DM at age 46 and had improved control since transitioning to human insulin a few years ago. He reports he struggles the most with hypoglycemic episodes, especially with exercising. Pt shares that he often eats prior to walking, but is still low. Pt able to state signs and symptoms of hypoglycemia and how to treat. Discussed importance of good meal intake to promote healing.   Pt denies any weight loss. Wt has been stable over the past few years.   Acute rehab team and TOC following for discharge disposition.   Medications reviewed and include vitamin B-12, ferrous  sulfate, melatonin, miralax, and senna.   Labs reviewed: Na: 131, CBGS: 234-321 (inpatient orders for glycemic control are 0-9 units insulin aspart TID with meals, 5 units insulin aspart TID with meals, and 16 units insulin glargine-yfgn daily at bedtime).    NUTRITION - FOCUSED PHYSICAL EXAM:  Flowsheet Row Most Recent Value  Orbital Region No depletion  Upper Arm Region No depletion  Thoracic and Lumbar Region No depletion  Buccal Region No depletion  Temple Region No depletion  Clavicle Bone Region No depletion  Clavicle and Acromion Bone Region No depletion  Scapular Bone Region No depletion  Dorsal Hand No depletion  Patellar Region No depletion  Anterior Thigh Region No depletion  Posterior Calf Region No depletion  Edema (RD Assessment) None  Hair Reviewed  Eyes Reviewed  Mouth Reviewed  Skin Reviewed  Nails Reviewed       Diet Order:   Diet Order             Diet Carb Modified Fluid consistency: Thin; Room service appropriate? Yes  Diet effective now                   EDUCATION NEEDS:   Not appropriate for education at this time  Skin:  Skin Assessment: Skin Integrity Issues: Skin Integrity Issues:: Incisions Incisions: closed rt hip  Last BM:  11/25/22 (type 5)  Height:   Ht Readings from Last 1 Encounters:  11/22/22 5\' 11"  (1.803 m)    Weight:  Wt Readings from Last 1 Encounters:  11/26/22 99.1 kg    Ideal Body Weight:  78 kg  BMI:  Body mass index is 30.47 kg/m.  Estimated Nutritional Needs:   Kcal:  2100-2400kcal/day  Protein:  105-120g/day  Fluid:  2.0L/day   Levada Schilling, RD, LDN, CDCES Registered Dietitian II Certified Diabetes Care and Education Specialist Please refer to [facility] for RD and/or RD on-call/weekend/after hours pager

## 2022-11-26 NOTE — Progress Notes (Signed)
Physical Therapy Treatment Patient Details Name: Bruce Lucas MRN: [id number] DOB: 1960/05/22 Today's Date: 11/26/2022   History of Present Illness Bruce Lucas is a 63 year old male who presents after fall from comode at home during hypoglycemic event, right femur fracture found, is now s/p IM nail fixation. PMH DM1, heart failure with reduced ejection fraction, HTN, CVA with residual right-sided weakness,CAD s/p CABG.    PT Comments  Pt in recliner on entry, reports pain well controlled while not moving, however ranging on limb remains quite excruciating, requires mod-maxA of RLE to perform LAQ from chair. Pt shown technique for transfers from elevated surface, practice with additional reps and time to practice PWB transition from TTWB which remains delayed and painful. Discussed importance of using analgesia to allow for movement, however pt does express concerns with potential for being overmedicated. WIll continue follow.    If plan is discharge home, recommend the following: A lot of help with walking and/or transfers;A lot of help with bathing/dressing/bathroom;Assist for transportation;Help with stairs or ramp for entrance;Assistance with cooking/housework   Can travel by private vehicle        Equipment Recommendations  None recommended by PT    Recommendations for Other Services       Precautions / Restrictions Precautions Precautions: Fall Restrictions RLE Weight Bearing: Partial weight bearing RLE Partial Weight Bearing Percentage or Pounds: 50     Mobility  Bed Mobility                    Transfers Overall transfer level: Needs assistance Equipment used: Rolling walker (2 wheels) Transfers: Sit to/from Stand Sit to Stand: Mod assist                Ambulation/Gait                   Stairs             Wheelchair Mobility     Tilt Bed    Modified Rankin (Stroke Patients Only)       Balance                                             Cognition                                                Exercises General Exercises - Lower Extremity Long Arc Quad: AROM, AAROM, Right, Left, 15 reps, 10 reps Hip Flexion/Marching: AROM, Left, 15 reps Heel Raises: AROM, Seated, 15 reps, Both Other Exercises Other Exercises: STS from elevated surface +minA; static standing x15-20 seconds, cues for symmetry for foot placement and flat foot contact (5x)    General Comments        Pertinent Vitals/Pain Pain Assessment Pain Assessment: Faces Faces Pain Scale: Hurts whole lot Pain Location: R leg Pain Descriptors / Indicators: Discomfort, Sore Pain Intervention(s): Limited activity within patient's tolerance, Monitored during session, Repositioned, Premedicated before session    Home Living                          Prior Function            PT Goals (current goals can now be  found in the care plan section) Acute Rehab PT Goals Patient Stated Goal: to go to a rehab facility then home PT Goal Formulation: With patient Time For Goal Achievement: 12/08/22 Potential to Achieve Goals: Good Progress towards PT goals: Progressing toward goals    Frequency    Min 1X/week      PT Plan      Co-evaluation              AM-PAC PT "6 Clicks" Mobility   Outcome Measure  Help needed turning from your back to your side while in a flat bed without using bedrails?: Total Help needed moving from lying on your back to sitting on the side of a flat bed without using bedrails?: Total Help needed moving to and from a bed to a chair (including a wheelchair)?: Total Help needed standing up from a chair using your arms (e.g., wheelchair or bedside chair)?: Total Help needed to walk in hospital room?: A Lot Help needed climbing 3-5 steps with a railing? : A Lot 6 Click Score: 8    End of Session   Activity Tolerance: Patient tolerated treatment well;Patient limited  by pain Patient left: in chair;with call bell/phone within reach;with chair alarm set   PT Visit Diagnosis: Other abnormalities of gait and mobility (R26.89);Difficulty in walking, not elsewhere classified (R26.2)     Time: 1610-9604 PT Time Calculation (min) (ACUTE ONLY): 23 min  Charges:    $Therapeutic Exercise: 23-37 mins PT General Charges $$ ACUTE PT VISIT: 1 Visit                    9:36 AM, 11/26/22 Rosamaria Lints, PT, DPT Physical Therapist - [facility]  [phone] (ASCOM)     Bruce Lucas 11/26/2022, 9:33 AM

## 2022-11-26 NOTE — Progress Notes (Signed)
Occupational Therapy Treatment Patient Details Name: Bruce Lucas MRN: [id number] DOB: [birth year] Today's Date: 11/26/2022   History of present illness Bruce Lucas is a 62 year old male who presents after fall from comode at home during hypoglycemic event, right femur fracture found, is now s/p IM nail fixation. PMH DM1, heart failure with reduced ejection fraction, HTN, CVA with residual right-sided weakness,CAD s/p CABG.   OT comments  Pt received seated in recliner. Appearing alert; willing to work with OT on transferring to bed, and then working on sit to stand twice more; first stand to sit t/f (on EOB), not well controlled; on second attempt pt much better with eccentric control. See flowsheet below for further details of session. Left semi-reclined in bed, bed alarm on, with all needs in reach.  Patient will benefit from continued OT while in acute care.      If plan is discharge home, recommend the following:  A lot of help with walking and/or transfers;A lot of help with bathing/dressing/bathroom;Assistance with cooking/housework;Direct supervision/assist for medications management;Direct supervision/assist for financial management;Assist for transportation;Help with stairs or ramp for entrance   Equipment Recommendations  Other (comment) (defer to next venue of care)    Recommendations for Other Services Rehab consult    Precautions / Restrictions Precautions Precautions: Fall Restrictions Weight Bearing Restrictions: Yes RLE Weight Bearing: Partial weight bearing RLE Partial Weight Bearing Percentage or Pounds: 50%       Mobility Bed Mobility Overal bed mobility: Needs Assistance         Sit to supine: Mod assist (for RLE)        Transfers Overall transfer level: Needs assistance Equipment used: Rolling walker (2 wheels) Transfers: Sit to/from Stand Sit to Stand: Contact guard assist (from recliner once, from EOB twice) Stand pivot transfers: Min  assist (+1. OT assisting with RW management and sigificant cues during t/f)         General transfer comment: Pt with improved mobility during transfers today; continues to require skilled assist and is high falls risk, but improving.     Balance Overall balance assessment: Needs assistance Sitting-balance support: Feet supported Sitting balance-Leahy Scale: Good     Standing balance support: Bilateral upper extremity supported, Reliant on assistive device for balance Standing balance-Leahy Scale: Poor Standing balance comment: Needing RW; OT only providing CGA while pt standing                           ADL either performed or assessed with clinical judgement   ADL                                         General ADL Comments: Deferred today in favor of continued mobility progression, as pt's biggest deficit to functional ADLs is mobility. Pt states he was able to t/f to [facility] earlier today with nursing staff assistance.    Extremity/Trunk Assessment Upper Extremity Assessment Upper Extremity Assessment: Overall WFL for tasks assessed   Lower Extremity Assessment Lower Extremity Assessment: Defer to PT evaluation;RLE deficits/detail RLE Deficits / Details: s/p ORIF        Vision       Perception     Praxis      Cognition Arousal: Alert Behavior During Therapy: WFL for tasks assessed/performed Overall Cognitive Status: Within Functional Limits for tasks assessed  General Comments: Follows one step commands consistently. Very pleasant and motivated.        Exercises      Shoulder Instructions       General Comments Pt on room air. Able to stand x1 minute from EOB for practice with sit to stand and stand to sit t/f's. Pt gaining confidence. Discussion with OT about d/c disposition, as pt stating he's not sure if he'll just go home with his daughter assisting; OT strongly recommended pt  attend rehab given his continued deficits and assist required with transfers and high falls risk. OT discussed process of rehab and answered pt questions about progression of mobility. Pt in agreement and states he understands importance of rehab.    Pertinent Vitals/ Pain       Pain Assessment Pain Assessment: 0-10 Pain Score: 5  Pain Location: R leg Pain Descriptors / Indicators: Discomfort, Sore Pain Intervention(s): Limited activity within patient's tolerance, Monitored during session  Home Living                                          Prior Functioning/Environment              Frequency  Min 1X/week        Progress Toward Goals  OT Goals(current goals can now be found in the care plan section)  Progress towards OT goals: Progressing toward goals  Acute Rehab OT Goals Patient Stated Goal: Get better; go home safely OT Goal Formulation: With patient Time For Goal Achievement: 12/08/22 Potential to Achieve Goals: Good ADL Goals Pt Will Perform Grooming: with min assist;standing Pt Will Perform Lower Body Dressing: with min assist;sit to/from stand Pt Will Transfer to Toilet: with min assist;bedside commode Pt Will Perform Toileting - Clothing Manipulation and hygiene: with min assist;sit to/from stand  Plan      Co-evaluation                 AM-PAC OT "6 Clicks" Daily Activity     Outcome Measure   Help from another person eating meals?: None Help from another person taking care of personal grooming?: A Little Help from another person toileting, which includes using toliet, bedpan, or urinal?: A Lot Help from another person bathing (including washing, rinsing, drying)?: A Lot Help from another person to put on and taking off regular upper body clothing?: A Little Help from another person to put on and taking off regular lower body clothing?: Total 6 Click Score: 15    End of Session Equipment Utilized During Treatment: Gait  belt;Rolling walker (2 wheels)  OT Visit Diagnosis: Unsteadiness on feet (R26.81);History of falling (Z91.81)   Activity Tolerance Patient tolerated treatment well   Patient Left in bed;with bed alarm set;with call bell/phone within reach (pt stating he is fatigued; encouraged to rest in bed and then return to chair to be up for dinner with nursing assist for transfer.)   Nurse Communication Mobility status        Time: 8119-1478 OT Time Calculation (min): 31 min  Charges: OT General Charges $OT Visit: 1 Visit OT Treatments $Therapeutic Activity: 23-37 mins  Linward Foster, MS, OTR/L  Alvester Morin 11/26/2022, 2:01 PM

## 2022-11-26 NOTE — Plan of Care (Signed)

## 2022-11-26 NOTE — Progress Notes (Signed)
Subjective: 3 Days Post-Op Procedure(s) (LRB): INTRAMEDULLARY (IM) NAIL INTERTROCHANTERIC (Right) Patient is alert and oriented sitting up in bed.  His pain is mild to moderate.  Hemoglobin is stable at 8.8.  He is participating in PT.  Social work is post to see him to start working on skilled nursing bed.  He is already 3 days postop and have not started this yet.  Patient reports pain as mild.  Objective:   VITALS:   Vitals:   11/26/22 0813 11/26/22 1232  BP: 135/62 123/64  Pulse: 91 85  Resp: 19 19  Temp: 97.6 F (36.4 C) 98.3 F (36.8 C)  SpO2: 98% 99%    Neurologically intact Neurovascular intact Sensation intact distally Dorsiflexion/Plantar flexion intact Incision: moderate drainage  LABS Recent Labs    11/24/22 0356 11/25/22 0330 11/26/22 0330  HGB 7.7* 8.7* 8.8*  HCT 25.0* 26.9* 27.6*  WBC 12.6* 9.4 8.6  PLT 267 245 273    Recent Labs    11/24/22 0356 11/25/22 0330 11/26/22 0330  NA 131* 131* 131*  K 4.6 4.3 3.7  BUN 24* 22 16  CREATININE 0.89 0.87 0.71  GLUCOSE 196* 249* 231*    No results for input(s): "LABPT", "INR" in the last 72 hours.   Assessment/Plan: 3 Days Post-Op Procedure(s) (LRB): INTRAMEDULLARY (IM) NAIL INTERTROCHANTERIC (Right)   Advance diet Up with therapy Discharge to SNF when bed available. DVT prophylaxis per medicine and vascular Will return to my office in 2 weeks after discharge for exam and x-ray.

## 2022-11-26 NOTE — Inpatient Diabetes Management (Signed)
Inpatient Diabetes Program Recommendations  AACE/ADA: New Consensus Statement on Inpatient Glycemic Control (2015)  Target Ranges:  Prepandial:   less than 140 mg/dL      Peak postprandial:   less than 180 mg/dL (1-2 hours)      Critically ill patients:  140 - 180 mg/dL   Lab Results  Component Value Date   GLUCAP 244 (H) 11/26/2022   HGBA1C 6.9 (H) 11/22/2022    Diabetes history: DM1 Outpatient Diabetes medications: Lantus 19 units at bedtime, Novolog 8-14 TID  Current orders for Inpatient glycemic control: Semglee 16 units at bedtime, Novolog 5 units tid meal coverage, Novolog 0-9 units tid correction   Inpatient Diabetes Program Recommendations:  Please consider: -Increase Semglee to 20 units at bedtime -Increase Novolog meal coverage to 8 units tid if eats 50% meals  Thank you, Bruce Lucas. Bruce Neighbors, RN, MSN, CDE  Diabetes Coordinator Inpatient Glycemic Control Team Team Pager [phone] (8am-5pm) 11/26/2022 9:59 AM

## 2022-11-27 DIAGNOSIS — I10 Essential (primary) hypertension: Secondary | ICD-10-CM | POA: Diagnosis not present

## 2022-11-27 DIAGNOSIS — S728X1A Other fracture of right femur, initial encounter for closed fracture: Secondary | ICD-10-CM | POA: Diagnosis not present

## 2022-11-27 LAB — GLUCOSE, CAPILLARY
Glucose-Capillary: 188 mg/dL — ABNORMAL HIGH (ref 70–99)
Glucose-Capillary: 232 mg/dL — ABNORMAL HIGH (ref 70–99)
Glucose-Capillary: 173 mg/dL — ABNORMAL HIGH (ref 70–99)
Glucose-Capillary: 218 mg/dL — ABNORMAL HIGH (ref 70–99)

## 2022-11-27 MED ORDER — ENOXAPARIN SODIUM 40 MG/0.4ML IJ SOSY
40.0000 mg | PREFILLED_SYRINGE | INTRAMUSCULAR | Status: DC
  Administered 2022-11-28: 40 mg via SUBCUTANEOUS
  Filled 2022-11-27: qty 0.4

## 2022-11-27 NOTE — TOC Progression Note (Signed)
Transition of Care [facility]) - Progression Note    Patient Details  Name: Bruce Lucas MRN: [id number] Date of Birth: 29-Jan-1961  Transition of Care [facility]) CM/SW Contact  Truddie Hidden, RN Phone Number: 11/27/2022, 10:30 AM  Clinical Narrative:    Spoke with patient's daughter regarding placement. She would like for patient to discharge to a facility near her. She is requesting a list of facilities near [facility].         Expected Discharge Plan and Services                                               Social Determinants of Health (SDOH) Interventions SDOH Screenings   Food Insecurity: No Food Insecurity (11/23/2022)  Housing: Low Risk  (11/23/2022)  Transportation Needs: No Transportation Needs (11/23/2022)  Utilities: Not At Risk (11/23/2022)  Depression (PHQ2-9): Low Risk  (05/15/2021)  Financial Resource Strain: Low Risk  (08/26/2022)   Received from [facility], [facility]  Social Connections: Unknown (08/17/2021)   Received from [facility], [facility]  Tobacco Use: Medium Risk (11/22/2022)    Readmission Risk Interventions     No data to display

## 2022-11-27 NOTE — TOC Progression Note (Signed)
Transition of Care [facility]) - Progression Note    Patient Details  Name: Bruce Lucas MRN: [id number] Date of Birth: November 25, 1960  Transition of Care [facility]) CM/SW Contact  Marlowe Sax, RN Phone Number: 11/27/2022, 12:41 PM  Clinical Narrative:    Daughter Lurena Joiner called back and confirmed the bed choice for [facility], I reached out to [place] at [facility] and accepted the bed, Ins pending        Expected Discharge Plan and Services                                               Social Determinants of Health (SDOH) Interventions SDOH Screenings   Food Insecurity: No Food Insecurity (11/23/2022)  Housing: Low Risk  (11/23/2022)  Transportation Needs: No Transportation Needs (11/23/2022)  Utilities: Not At Risk (11/23/2022)  Depression (PHQ2-9): Low Risk  (05/15/2021)  Financial Resource Strain: Low Risk  (08/26/2022)   Received from [facility], [facility]  Social Connections: Unknown (08/17/2021)   Received from [facility], [facility]  Tobacco Use: Medium Risk (11/22/2022)    Readmission Risk Interventions     No data to display

## 2022-11-27 NOTE — Progress Notes (Signed)
Occupational Therapy Treatment Patient Details Name: Bruce Lucas MRN: [id number] DOB: 30-Dec-1960 Today's Date: 11/27/2022   History of present illness Bruce Lucas is a 62 year old male who presents after fall from comode at home during hypoglycemic event, right femur fracture found, is now s/p IM nail fixation. PMH DM1, heart failure with reduced ejection fraction, HTN, CVA with residual right-sided weakness,CAD s/p CABG.   OT comments  Bruce Lucas was seen for OT treatment on this date. Upon arrival to room pt seated in chair, agreeable to tx. Pt requires CGA + RW sit<>stand x 4 trials and step pivot t/f. SBA with B forearm support for standing grooming tasks. Tolerated seated therex: chair sit ups, chair push ups. Pt making good progress toward goals, will continue to follow POC. Discharge recommendation remains appropriate.        If plan is discharge home, recommend the following:  A lot of help with walking and/or transfers;A lot of help with bathing/dressing/bathroom;Assistance with cooking/housework;Direct supervision/assist for medications management;Direct supervision/assist for financial management;Assist for transportation;Help with stairs or ramp for entrance   Equipment Recommendations  Other (comment) (defer)    Recommendations for Other Services      Precautions / Restrictions Precautions Precautions: Fall Restrictions Weight Bearing Restrictions: Yes RLE Weight Bearing: Partial weight bearing RLE Partial Weight Bearing Percentage or Pounds: 50%       Mobility Bed Mobility               General bed mobility comments: not tested    Transfers Overall transfer level: Needs assistance Equipment used: Rolling walker (2 wheels) Transfers: Sit to/from Stand Sit to Stand: Contact guard assist     Step pivot transfers: Contact guard assist           Balance Overall balance assessment: Needs assistance Sitting-balance support: Feet supported Sitting  balance-Leahy Scale: Good     Standing balance support: Single extremity supported, During functional activity Standing balance-Leahy Scale: Fair                             ADL either performed or assessed with clinical judgement   ADL Overall ADL's : Needs assistance/impaired                                       General ADL Comments: CGA + RW for ADL t/f and standing grooming tasks      Cognition Arousal: Alert Behavior During Therapy: WFL for tasks assessed/performed Overall Cognitive Status: Within Functional Limits for tasks assessed                                 General Comments: Follows one step commands consistently. Very pleasant and motivated.                   Pertinent Vitals/ Pain       Pain Assessment Pain Assessment: 0-10 Pain Score: 4  Pain Location: R leg Pain Descriptors / Indicators: Discomfort, Sore Pain Intervention(s): Limited activity within patient's tolerance, Repositioned   Frequency  Min 1X/week        Progress Toward Goals  OT Goals(current goals can now be found in the care plan section)  Progress towards OT goals: Progressing toward goals  Acute Rehab OT Goals Patient Stated Goal: return to  PLOF OT Goal Formulation: With patient Time For Goal Achievement: 12/08/22 Potential to Achieve Goals: Good ADL Goals Pt Will Perform Grooming: with min assist;standing Pt Will Perform Lower Body Dressing: with min assist;sit to/from stand Pt Will Transfer to Toilet: with min assist;bedside commode Pt Will Perform Toileting - Clothing Manipulation and hygiene: with min assist;sit to/from stand  Plan      Co-evaluation                 AM-PAC OT "6 Clicks" Daily Activity     Outcome Measure   Help from another person eating meals?: None Help from another person taking care of personal grooming?: A Little Help from another person toileting, which includes using toliet, bedpan, or  urinal?: A Lot Help from another person bathing (including washing, rinsing, drying)?: A Lot Help from another person to put on and taking off regular upper body clothing?: A Little Help from another person to put on and taking off regular lower body clothing?: A Lot 6 Click Score: 16    End of Session Equipment Utilized During Treatment: Gait belt;Rolling walker (2 wheels)  OT Visit Diagnosis: Unsteadiness on feet (R26.81);History of falling (Z91.81)   Activity Tolerance Patient tolerated treatment well   Patient Left with call bell/phone within reach;in chair   Nurse Communication          Time: 1610-9604 OT Time Calculation (min): 16 min  Charges: OT General Charges $OT Visit: 1 Visit OT Treatments $Therapeutic Exercise: 8-22 mins  Bruce Lucas, M.S. OTR/L  11/27/22, 12:32 PM  ascom [phone]

## 2022-11-27 NOTE — Progress Notes (Signed)
Subjective: 4 Days Post-Op Procedure(s) (LRB): INTRAMEDULLARY (IM) NAIL INTERTROCHANTERIC (Right) Patient is doing well and stable.  Reviewing skilled nursing bed offers.  No difference in pain level.  Patient reports pain as mild.  Objective:   VITALS:   Vitals:   11/26/22 2251 11/27/22 0718  BP: 133/60 134/63  Pulse: 82 83  Resp: 18 16  Temp: 98.7 F (37.1 C) 98.8 F (37.1 C)  SpO2: 98% 95%    Neurologically intact Dorsiflexion/Plantar flexion intact Incision: scant drainage  LABS Recent Labs    11/25/22 0330 11/26/22 0330  HGB 8.7* 8.8*  HCT 26.9* 27.6*  WBC 9.4 8.6  PLT 245 273    Recent Labs    11/25/22 0330 11/26/22 0330  NA 131* 131*  K 4.3 3.7  BUN 22 16  CREATININE 0.87 0.71  GLUCOSE 249* 231*    No results for input(s): "LABPT", "INR" in the last 72 hours.   Assessment/Plan: 4 Days Post-Op Procedure(s) (LRB): INTRAMEDULLARY (IM) NAIL INTERTROCHANTERIC (Right)   Discharge to SNF when bed available. Follow-up in my office in 2 weeks. 50% weightbearing right leg. DVT prophylaxis per medical service.

## 2022-11-27 NOTE — Consult Note (Signed)
PHARMACIST - PHYSICIAN COMMUNICATION  CONCERNING:  Enoxaparin (Lovenox) for DVT Prophylaxis   ASSESSMENT: Patient was prescribed enoxaparin 40 mg subcutaneously every 24 hours for VTE prophylaxis. Per most recent note dated 8/21, ortho now deferring DVT prophylaxis to medical team.  Body mass index is 29.4 kg/m.  Estimated Creatinine Clearance: 111.5 mL/min (by C-G formula based on SCr of 0.71 mg/dL).  Based on [facility] policy, patient qualifies for enoxaparin dosing of 40 mg every 24 hours because their creatinine clearance is >30 mL/min.  PLAN: Pharmacy has adjusted enoxaparin dose per [facility] policy.  Description: Patient is now receiving enoxaparin 40 mg subcutaneously every 24 hours.  Will M. Dareen Piano, PharmD Clinical Pharmacist 11/27/2022 12:17 PM

## 2022-11-27 NOTE — TOC Progression Note (Addendum)
Transition of Care [facility]) - Progression Note    Patient Details  Name: Bruce Lucas MRN: [id number] Date of Birth: 08/31/1960  Transition of Care [facility]) CM/SW Contact  Bruce Sax, RN Phone Number: 11/27/2022, 10:27 AM  Clinical Narrative:    Spoke to the patient and reviewed the bed offers for STR with him, I also reviewed Medicare STAR ratings He stated that he would have his Girlfriend call me back later, I explained that I would need to get Ins approval and asked that they call me back by Lunch time today, He stated that I should call his Daughter Bruce Lucas called Bruce Lucas at [phone], was not able to get her and left a general VM for a call back  Left Secure VM for [facility] Junious Dresser asking for ins approval to go to STR and for [facility]      Expected Discharge Plan and Services                                               Social Determinants of Health (SDOH) Interventions SDOH Screenings   Food Insecurity: No Food Insecurity (11/23/2022)  Housing: Low Risk  (11/23/2022)  Transportation Needs: No Transportation Needs (11/23/2022)  Utilities: Not At Risk (11/23/2022)  Depression (PHQ2-9): Low Risk  (05/15/2021)  Financial Resource Strain: Low Risk  (08/26/2022)   Received [facility], [facility]  Social Connections: Unknown (08/17/2021)   Received from [facility], [facility]  Tobacco Use: Medium Risk (11/22/2022)    Readmission Risk Interventions     No data to display

## 2022-11-27 NOTE — Progress Notes (Signed)
Physical Therapy Treatment Patient Details Name: Bruce Lucas MRN: [id number] DOB: [birth year] Today's Date: 11/27/2022   History of Present Illness Kirklan Bueno is a 62 year old male who presents after fall from comode at home during hypoglycemic event, right femur fracture found, is now s/p IM nail fixation. PMH DM1, heart failure with reduced ejection fraction, HTN, CVA with residual right-sided weakness,CAD s/p CABG.    PT Comments  Pt in chair on entry, agreeable to session. Pt able to tolerate an increase in both volume and intensity focal leg exercises this date. WIll plan on BID session late in day to work on transfers and possibly gait training. ROM of RLE appears better tolerated today but still very painful.    If plan is discharge home, recommend the following: A lot of help with walking and/or transfers;A lot of help with bathing/dressing/bathroom;Assist for transportation;Help with stairs or ramp for entrance;Assistance with cooking/housework   Can travel by private vehicle        Equipment Recommendations  None recommended by PT    Recommendations for Other Services       Precautions / Restrictions Precautions Precautions: Fall Restrictions Weight Bearing Restrictions: No RLE Weight Bearing: Partial weight bearing RLE Partial Weight Bearing Percentage or Pounds: 50%     Mobility  Bed Mobility                    Transfers                        Ambulation/Gait                   Stairs             Wheelchair Mobility     Tilt Bed    Modified Rankin (Stroke Patients Only)       Balance                                            Cognition                                                Exercises Other Exercises Other Exercises: LLE: LAQ 1x15, 2x15 @ 5lb AW, seated marching 1x15, seated marching 2x15 @ 5lb AW, Other Exercises: RLE heel slides x20, heel raises x20, AA/ROM  LAQ and AR/ROM hamstrings flexion 2x10    General Comments        Pertinent Vitals/Pain Pain Assessment Pain Assessment: 0-10 Pain Score: 5  Pain Location: R leg Pain Descriptors / Indicators: Discomfort, Sore Pain Intervention(s): Limited activity within patient's tolerance, Monitored during session, Premedicated before session    Home Living                          Prior Function            PT Goals (current goals can now be found in the care plan section) Acute Rehab PT Goals Patient Stated Goal: to go to a rehab facility then home PT Goal Formulation: With patient Time For Goal Achievement: 12/08/22 Potential to Achieve Goals: Good Progress towards PT goals: Progressing toward goals    Frequency    Min 1X/week  PT Plan      Co-evaluation              AM-PAC PT "6 Clicks" Mobility   Outcome Measure  Help needed turning from your back to your side while in a flat bed without using bedrails?: A Lot Help needed moving from lying on your back to sitting on the side of a flat bed without using bedrails?: A Lot Help needed moving to and from a bed to a chair (including a wheelchair)?: A Lot Help needed standing up from a chair using your arms (e.g., wheelchair or bedside chair)?: A Lot Help needed to walk in hospital room?: A Lot Help needed climbing 3-5 steps with a railing? : Total 6 Click Score: 11    End of Session   Activity Tolerance: Patient tolerated treatment well;Patient limited by pain Patient left: in chair;with call bell/phone within reach Nurse Communication:  (no SCD pump 9in room) PT Visit Diagnosis: Other abnormalities of gait and mobility (R26.89);Difficulty in walking, not elsewhere classified (R26.2)     Time: 7829-5621 PT Time Calculation (min) (ACUTE ONLY): 23 min  Charges:    $Therapeutic Exercise: 23-37 mins PT General Charges $$ ACUTE PT VISIT: 1 Visit                    11:43 AM, 11/27/22 Rosamaria Lints, PT, DPT Physical Therapist - [facility]  [phone] (ASCOM)    Richie Bonanno C 11/27/2022, 11:42 AM

## 2022-11-27 NOTE — Progress Notes (Signed)
  Progress Note   Patient: Bruce Lucas FUX:[id number] DOB: 1961-02-28 DOA: 11/22/2022     5 DOS: the patient was seen and examined on 11/27/2022   Brief hospital course: From HPI on admission "62 year old gentleman with a past medical history of type 1 diabetes mellitus, heart failure with reduced ejection fraction, hypertension, CVA with residual right-sided weakness, coronary artery disease status post CABG who presented to the emergency department after he fell off the toilet hitting his right lower extremity.  He is now status post IM nailing of his right lower extremity.  Presented with significant lactic acid elevation and leukocytosis, has improved with volume expansion. Found to have PAD on CT-A."  Further hospital course and management as outlined below.   Assessment and Plan:  * Femoral intertrochanteric fracture (HCC) Right hip fracture Status post IM nailing,  Pt/ot consulted advising snf, TOC working on this Pain control Continue bowel regimen, had BM last night  Acute blood loss anemia 9.9 on presentation, 7.7 after surgery, appropriate rise to mid-8s since - iron supplement  Peripheral arterial disease (HCC) Abnormal CTA here, evaluated by vascular, as is asymtpomatic and wound-free they advise no additional intervention save for continuing medical mgmt (asa, statin, etc.)  AKI (acute kidney injury) (HCC) Resolved w/ fluids, tolerating po - monitor off fluids  Type 1 Diabetes mellitus (HCC) - continue basal/bolus, continue ssi - adjust insulin for goal 140-180  HFrecoveredEF (HCC) Appears euvolemic - cont home metoprolol  Hyperlipidemia Changed simvastatin 5 mg to atorvastatin 40 mg  Essential hypertension Bps wnl - home losart on hold, continuing home metop  Coronary artery disease, hx cabg No chest pain - cont asa/statin      Subjective: Pt up in recliner, reports pain controlled, needed med before working with therapy.  He denies acute  complaints.  Physical Exam: Vitals:   11/26/22 1556 11/26/22 2251 11/27/22 0453 11/27/22 0718  BP: (!) 127/59 133/60  134/63  Pulse: 82 82  83  Resp: 18 18  16   Temp: 98 F (36.7 C) 98.7 F (37.1 C)  98.8 F (37.1 C)  TempSrc: Oral   Oral  SpO2: 100% 98%  95%  Weight:   95.6 kg   Height:       General exam: awake, alert, no acute distress HEENT: moist mucus membranes, hearing grossly normal  Respiratory system: CTAB, no wheezes, rales or rhonchi, normal respiratory effort. Cardiovascular system: normal S1/S2, RRR, no pedal edema.   Gastrointestinal system: soft, NT, ND, no HSM felt, +bowel sounds. Central nervous system: A&O x 3. no gross focal neurologic deficits, normal speech Extremities: moves all, no edema, normal tone Skin: dry, intact, normal temperature Psychiatry: normal mood, congruent affect    Family Communication: male family member at bedside on rounds  Disposition: Status is: Inpatient  Planned Discharge Destination: snf    Time spent: 25 minutes  Author: Pennie Banter, DO 11/27/2022 3:18 PM  For on call review [email or web address].

## 2022-11-27 NOTE — Progress Notes (Signed)
Physical Therapy Treatment Patient Details Name: Bruce Lucas MRN: [id number] DOB: 28-Oct-1960 Today's Date: 11/27/2022   History of Present Illness Bruce Lucas is a 62 year old male who presents after fall from comode at home during hypoglycemic event, right femur fracture found, is now s/p IM nail fixation. PMH DM1, heart failure with reduced ejection fraction, HTN, CVA with residual right-sided weakness,CAD s/p CABG.    PT Comments  Pt still up to chair on entry. We spend session working on transfers and AMB short distances. Pt shows excellent progress in general, this being the first day he's able to perform any notable AMB. Pt AMB 4x overall, up to 16' in length, able to perform retroAMB as well. Pain remains a limiting factor, as does limb edema. Cues given reminding pt to attempt PWB when standing, rather than TTWB.     If plan is discharge home, recommend the following: A lot of help with walking and/or transfers;A lot of help with bathing/dressing/bathroom;Assist for transportation;Help with stairs or ramp for entrance;Assistance with cooking/housework   Can travel by private vehicle        Equipment Recommendations  None recommended by PT    Recommendations for Other Services       Precautions / Restrictions Precautions Precautions: Fall Restrictions Weight Bearing Restrictions: Yes RLE Weight Bearing: Partial weight bearing RLE Partial Weight Bearing Percentage or Pounds: 50%     Mobility  Bed Mobility                    Transfers Overall transfer level: Needs assistance Equipment used: Rolling walker (2 wheels) Transfers: Sit to/from Stand Sit to Stand: Contact guard assist           General transfer comment: heavy effort    Ambulation/Gait Ambulation/Gait assistance: Contact guard assist Gait Distance (Feet): 16 Feet (8', 8', 16', 16') Assistive device: Rolling walker (2 wheels) Gait Pattern/deviations: Step-to pattern       General  Gait Details: maintains TTWB in gait and largely Tax adviser     Tilt Bed    Modified Rankin (Stroke Patients Only)       Balance                                            Cognition                                                Exercises Other Exercises Other Exercises: RLE heel slides on floor, short sitting x20 Other Exercises: STS from elevated recliner (CGA), AMB 60ft fwd, sit; STS from elevated recliner (CGA), AMB 28ft fwd, sit; STS from elevated recliner (CGA), AMB 12ft fwd, AMB 62ft back, sit; STS from elevated recliner (CGA), AMB 49ft fwd, AMB 33ft back, sit;    General Comments        Pertinent Vitals/Pain Pain Assessment Pain Assessment: 0-10 Pain Score: 3  Pain Location: R leg Pain Descriptors / Indicators: Discomfort, Sore Pain Intervention(s): Limited activity within patient's tolerance, Monitored during session, Repositioned    Home Living  Prior Function            PT Goals (current goals can now be found in the care plan section) Acute Rehab PT Goals Patient Stated Goal: to go to a rehab facility then home PT Goal Formulation: With patient Time For Goal Achievement: 12/08/22 Potential to Achieve Goals: Good Progress towards PT goals: Progressing toward goals    Frequency    Min 1X/week      PT Plan      Co-evaluation              AM-PAC PT "6 Clicks" Mobility   Outcome Measure  Help needed turning from your back to your side while in a flat bed without using bedrails?: A Lot Help needed moving from lying on your back to sitting on the side of a flat bed without using bedrails?: A Lot Help needed moving to and from a bed to a chair (including a wheelchair)?: A Lot Help needed standing up from a chair using your arms (e.g., wheelchair or bedside chair)?: A Lot Help needed to walk in hospital room?: A  Lot Help needed climbing 3-5 steps with a railing? : Total 6 Click Score: 11    End of Session Equipment Utilized During Treatment: Gait belt Activity Tolerance: Patient tolerated treatment well;Patient limited by pain Patient left: in chair;with call bell/phone within reach Nurse Communication:  (no SCD pump 9in room) PT Visit Diagnosis: Other abnormalities of gait and mobility (R26.89);Difficulty in walking, not elsewhere classified (R26.2)     Time: 1248-1310 PT Time Calculation (min) (ACUTE ONLY): 22 min  Charges:    $Gait Training: 8-22 mins $Therapeutic Exercise: 23-37 mins PT General Charges $$ ACUTE PT VISIT: 1 Visit                    1:23 PM, 11/27/22 Rosamaria Lints, PT, DPT Physical Therapist - [facility]  [phone] (ASCOM)    Devin Foskey C 11/27/2022, 1:21 PM

## 2022-11-28 ENCOUNTER — Encounter: Payer: Self-pay | Admitting: Internal Medicine

## 2022-11-28 DIAGNOSIS — R2689 Other abnormalities of gait and mobility: Secondary | ICD-10-CM | POA: Diagnosis not present

## 2022-11-28 DIAGNOSIS — N179 Acute kidney failure, unspecified: Secondary | ICD-10-CM | POA: Diagnosis not present

## 2022-11-28 DIAGNOSIS — E785 Hyperlipidemia, unspecified: Secondary | ICD-10-CM | POA: Diagnosis not present

## 2022-11-28 DIAGNOSIS — Z951 Presence of aortocoronary bypass graft: Secondary | ICD-10-CM | POA: Diagnosis not present

## 2022-11-28 DIAGNOSIS — Z9181 History of falling: Secondary | ICD-10-CM | POA: Diagnosis not present

## 2022-11-28 DIAGNOSIS — E1051 Type 1 diabetes mellitus with diabetic peripheral angiopathy without gangrene: Secondary | ICD-10-CM | POA: Diagnosis not present

## 2022-11-28 DIAGNOSIS — I2510 Atherosclerotic heart disease of native coronary artery without angina pectoris: Secondary | ICD-10-CM | POA: Diagnosis not present

## 2022-11-28 DIAGNOSIS — I69398 Other sequelae of cerebral infarction: Secondary | ICD-10-CM | POA: Diagnosis not present

## 2022-11-28 DIAGNOSIS — I10 Essential (primary) hypertension: Secondary | ICD-10-CM | POA: Diagnosis not present

## 2022-11-28 DIAGNOSIS — S72141D Displaced intertrochanteric fracture of right femur, subsequent encounter for closed fracture with routine healing: Secondary | ICD-10-CM | POA: Diagnosis not present

## 2022-11-28 DIAGNOSIS — S728X1A Other fracture of right femur, initial encounter for closed fracture: Secondary | ICD-10-CM | POA: Diagnosis not present

## 2022-11-28 DIAGNOSIS — S728X1D Other fracture of right femur, subsequent encounter for closed fracture with routine healing: Secondary | ICD-10-CM | POA: Diagnosis not present

## 2022-11-28 DIAGNOSIS — I110 Hypertensive heart disease with heart failure: Secondary | ICD-10-CM | POA: Diagnosis not present

## 2022-11-28 DIAGNOSIS — D6869 Other thrombophilia: Secondary | ICD-10-CM | POA: Diagnosis not present

## 2022-11-28 DIAGNOSIS — L89892 Pressure ulcer of other site, stage 2: Secondary | ICD-10-CM | POA: Diagnosis not present

## 2022-11-28 DIAGNOSIS — I5020 Unspecified systolic (congestive) heart failure: Secondary | ICD-10-CM | POA: Diagnosis not present

## 2022-11-28 DIAGNOSIS — D649 Anemia, unspecified: Secondary | ICD-10-CM | POA: Diagnosis not present

## 2022-11-28 DIAGNOSIS — S72141A Displaced intertrochanteric fracture of right femur, initial encounter for closed fracture: Secondary | ICD-10-CM | POA: Diagnosis not present

## 2022-11-28 DIAGNOSIS — R2681 Unsteadiness on feet: Secondary | ICD-10-CM | POA: Diagnosis not present

## 2022-11-28 DIAGNOSIS — D62 Acute posthemorrhagic anemia: Secondary | ICD-10-CM | POA: Diagnosis not present

## 2022-11-28 DIAGNOSIS — E10319 Type 1 diabetes mellitus with unspecified diabetic retinopathy without macular edema: Secondary | ICD-10-CM | POA: Diagnosis not present

## 2022-11-28 DIAGNOSIS — M6281 Muscle weakness (generalized): Secondary | ICD-10-CM | POA: Diagnosis not present

## 2022-11-28 DIAGNOSIS — R262 Difficulty in walking, not elsewhere classified: Secondary | ICD-10-CM | POA: Diagnosis not present

## 2022-11-28 LAB — CULTURE, BLOOD (ROUTINE X 2)
Culture: NO GROWTH
Culture: NO GROWTH
Special Requests: ADEQUATE
Special Requests: ADEQUATE

## 2022-11-28 LAB — BASIC METABOLIC PANEL
Anion gap: 9 (ref 5–15)
BUN: 17 mg/dL (ref 8–23)
CO2: 26 mmol/L (ref 22–32)
Calcium: 8.5 mg/dL — ABNORMAL LOW (ref 8.9–10.3)
Chloride: 101 mmol/L (ref 98–111)
Creatinine, Ser: 0.74 mg/dL (ref 0.61–1.24)
GFR, Estimated: >60 mL/min (ref >60)
Glucose, Bld: 120 mg/dL — ABNORMAL HIGH (ref 70–99)
Potassium: 3.7 mmol/L (ref 3.5–5.1)
Sodium: 136 mmol/L (ref 135–145)

## 2022-11-28 LAB — GLUCOSE, CAPILLARY
Glucose-Capillary: 194 mg/dL — ABNORMAL HIGH (ref 70–99)
Glucose-Capillary: 194 mg/dL — ABNORMAL HIGH (ref 70–99)
Glucose-Capillary: 204 mg/dL — ABNORMAL HIGH (ref 70–99)

## 2022-11-28 MED ORDER — POLYETHYLENE GLYCOL 3350 17 G PO PACK: 17.0000 g | PACK | Freq: Every day | ORAL | Status: AC

## 2022-11-28 MED ORDER — ATORVASTATIN CALCIUM 40 MG PO TABS: 40.0000 mg | ORAL_TABLET | Freq: Every day | ORAL | Status: AC

## 2022-11-28 MED ORDER — HYDROCODONE-ACETAMINOPHEN 5-325 MG PO TABS: 1.0000 | ORAL_TABLET | ORAL | 0 refills | Status: DC | PRN

## 2022-11-28 MED ORDER — FERROUS SULFATE 325 (65 FE) MG PO TABS: 325.0000 mg | ORAL_TABLET | ORAL | Status: AC

## 2022-11-28 MED ORDER — INSULIN ASPART 100 UNIT/ML IJ SOLN: 11.0000 [IU] | Freq: Three times a day (TID) | INTRAMUSCULAR | Status: DC

## 2022-11-28 MED ORDER — ALUM & MAG HYDROXIDE-SIMETH 200-200-20 MG/5ML PO SUSP: 30.0000 mL | ORAL | Status: AC | PRN

## 2022-11-28 MED ORDER — BISACODYL 10 MG RE SUPP: 10.0000 mg | Freq: Every day | RECTAL | Status: AC | PRN

## 2022-11-28 MED ORDER — LANTUS SOLOSTAR 100 UNIT/ML ~~LOC~~ SOPN: 24.0000 [IU] | PEN_INJECTOR | Freq: Every day | SUBCUTANEOUS | Status: DC

## 2022-11-28 MED ORDER — ADULT MULTIVITAMIN W/MINERALS CH: 1.0000 | ORAL_TABLET | Freq: Every day | ORAL | Status: AC

## 2022-11-28 MED ORDER — SENNA 8.6 MG PO TABS: 1.0000 | ORAL_TABLET | Freq: Two times a day (BID) | ORAL | Status: AC

## 2022-11-28 MED ORDER — ENOXAPARIN SODIUM 40 MG/0.4ML IJ SOSY: 40.0000 mg | PREFILLED_SYRINGE | INTRAMUSCULAR | Status: DC

## 2022-11-28 MED ORDER — ACETAMINOPHEN 325 MG PO TABS: 325.0000 mg | ORAL_TABLET | Freq: Four times a day (QID) | ORAL | Status: AC | PRN

## 2022-11-28 MED ORDER — INSULIN ASPART 100 UNIT/ML IJ SOLN: 0.0000 [IU] | Freq: Three times a day (TID) | INTRAMUSCULAR | Status: DC

## 2022-11-28 MED ORDER — HYDROXYZINE HCL 25 MG PO TABS: 25.0000 mg | ORAL_TABLET | Freq: Three times a day (TID) | ORAL | Status: DC | PRN

## 2022-11-28 NOTE — Progress Notes (Signed)
Report was called over to [facility]

## 2022-11-28 NOTE — Care Management Important Message (Signed)
Important Message  Patient Details  Name: Bruce Lucas MRN: [id number] Date of Birth: 1961/04/04   Medicare Important Message Given:  Yes     Verita Schneiders Alajah Witman 11/28/2022, 10:48 AM

## 2022-11-28 NOTE — TOC Progression Note (Signed)
Transition of Care [facility] - Progression Note    Patient Details  Name: Bruce Lucas MRN: [id number] Date of Birth: 18-Sep-1960  [facility]) CM/SW Contact  Marlowe Sax, RN Phone Number: 11/28/2022, 8:58 AM  Clinical Narrative:    Received notification that the Ins was approved to go to [facility] and that EMS was approved as well        Expected Discharge Plan and Services                                               Social Determinants of Health (SDOH) Interventions SDOH Screenings   Food Insecurity: No Food Insecurity (11/23/2022)  Housing: Low Risk  (11/23/2022)  Transportation Needs: No Transportation Needs (11/23/2022)  Utilities: Not At Risk (11/23/2022)  Depression (PHQ2-9): Low Risk  (05/15/2021)  Financial Resource Strain: Low Risk  (08/26/2022)   Received from [facility], [facility]  Social Connections: Unknown (08/17/2021)   Received from [facility], [facility]  Tobacco Use: Medium Risk (11/22/2022)    Readmission Risk Interventions     No data to display

## 2022-11-28 NOTE — Discharge Summary (Signed)
Physician Discharge Summary   Patient: Bruce Lucas MRN: [id number] DOB: [birth year]  Admit date:     11/22/2022  Discharge date: 11/28/22  Discharge Physician: Pennie Banter   PCP: [name], MD   Recommendations at discharge:   Follow up with Dr. Hyacinth Meeker, Orthopedic Surgery in 2 weeks Activity -- 50% weight-bearing on right leg Follow up with Primary Care in 1-2 weeks Follow up on glycemic control and insulin regimen.    Discharge Diagnoses: Principal Problem:   Femoral fracture (HCC) Active Problems:   Coronary artery disease involving native coronary artery of native heart without angina pectoris   Essential hypertension   Stroke (HCC)   Hyperlipidemia   HFrEF (heart failure with reduced ejection fraction) (HCC)   T1DM (type 1 diabetes mellitus) (HCC)   Peripheral arterial disease (HCC)   Normocytic anemia   Secondary hypercoagulable state (HCC)  Resolved Problems:   Metabolic acidosis   Leukocytosis   Type 1 diabetes (HCC)   Sepsis (HCC)   Hyponatremia   AKI (acute kidney injury) [facility])  Hospital Course:  From HPI on admission "62 year old gentleman with a past medical history of type 1 diabetes mellitus, heart failure with reduced ejection fraction, hypertension, CVA with residual right-sided weakness, coronary artery disease status post CABG who presented to the emergency department after he fell off the toilet hitting his right lower extremity.  He is now status post IM nailing of his right lower extremity.  Presented with significant lactic acid elevation and leukocytosis, has improved with volume expansion. Found to have PAD on CT-A."   Further hospital course and management as outlined below.  11/28/22 --- pt doing well, denies any complaints. Insurance auth received for SNF/rehab. Patient is medically stable and cleared for discharge today.   Assessment and Plan:  * Femoral intertrochanteric fracture (HCC) Right hip fracture Status post IM  nailing 11/23/22 by Dr. Hyacinth Meeker PT/OT evaluation -- SNF/rehab recommended Pain control - Tylenol or Norco PRN Bowel regimen Follow up with Dr. Hyacinth Meeker in 2 weeks 50% weight bearing on right leg Lovenox 10 more days for DVT prophylaxis   Acute blood loss anemia 9.9 on presentation, 7.7 after surgery, appropriate rise to mid-8s since - iron supplement - repeat CBC at follow up   Peripheral arterial disease (HCC) Abnormal CTA here, evaluated by vascular, as is asymtpomatic and wound-free they advise no additional intervention save for continuing medical mgmt (asa, statin, etc.)   AKI (acute kidney injury) (HCC) Resolved w/ fluids, tolerating po - repeat BMP at follow up   Type 1 Diabetes mellitus (HCC) - continue Semglee basal insulin at 24 units at bedtime - continue Novolog 11 units with meals, plus 0-9 units per sliding scale - hold home Fiasp for now, resume after rehab & PCP follow up   HFrecoveredEF (HCC) Appears euvolemic - cont home metoprolol - daily weights to monitor volume status   Hyperlipidemia - Changed simvastatin 5 mg to atorvastatin 40 mg   Essential hypertension - continuing home metoprolol, losartan   Coronary artery disease, hx cabg No chest pain - cont asa/statin         Consultants: Orthopedic surgery Procedures performed: Intramedullary nail to right hip intertrochanteric fracture   Disposition: Skilled nursing facility  Diet recommendation:  Discharge Diet Orders (From admission, onward)     Start     Ordered   11/28/22 0000  Diet - low sodium heart healthy        11/28/22 1041  DISCHARGE MEDICATION: Allergies as of 11/28/2022       Reactions   Codeine    Penicillins         Medication List     STOP taking these medications    Fiasp FlexTouch 100 UNIT/ML FlexTouch Pen Generic drug: insulin aspart   simvastatin 5 MG tablet Commonly known as: ZOCOR       TAKE these medications    acetaminophen 325 MG  tablet Commonly known as: TYLENOL Take 1-2 tablets (325-650 mg total) by mouth every 6 (six) hours as needed for mild pain (pain score 1-3 or temp > 100.5).   alum & mag hydroxide-simeth 200-200-20 MG/5ML suspension Commonly known as: MAALOX/MYLANTA Take 30 mLs by mouth every 4 (four) hours as needed for indigestion.   aspirin EC 81 MG tablet Take 81 mg by mouth daily.   atorvastatin 40 MG tablet Commonly known as: LIPITOR Take 1 tablet (40 mg total) by mouth daily. Start taking on: November 29, 2022   bisacodyl 10 MG suppository Commonly known as: DULCOLAX Place 1 suppository (10 mg total) rectally daily as needed for moderate constipation.   cyanocobalamin 1000 MCG tablet Take 1 tablet by mouth daily.   DULoxetine 30 MG capsule Commonly known as: CYMBALTA Take 30 mg by mouth daily.   enoxaparin 40 MG/0.4ML injection Commonly known as: LOVENOX Inject 0.4 mLs (40 mg total) into the skin daily for 10 days. Start taking on: November 29, 2022   ferrous sulfate 325 (65 FE) MG tablet Take 1 tablet (325 mg total) by mouth every other day. Start taking on: November 29, 2022   HYDROcodone-acetaminophen 5-325 MG tablet Commonly known as: NORCO/VICODIN Take 1-2 tablets by mouth every 4 (four) hours as needed for moderate pain or severe pain.   hydrOXYzine 25 MG tablet Commonly known as: ATARAX Take 1 tablet (25 mg total) by mouth 3 (three) times daily as needed for anxiety.   insulin aspart 100 UNIT/ML injection Commonly known as: novoLOG Inject 11 Units into the skin 3 (three) times daily with meals.   insulin aspart 100 UNIT/ML injection Commonly known as: novoLOG Inject 0-9 Units into the skin 3 (three) times daily with meals.   Lantus SoloStar 100 UNIT/ML Solostar Pen Generic drug: insulin glargine Inject 24 Units into the skin at bedtime. What changed: how much to take   losartan 25 MG tablet Commonly known as: COZAAR Take 12.5 mg by mouth daily.   Melatonin 10 MG  Tabs Take 10 mg by mouth at bedtime.   metoprolol succinate 25 MG 24 hr tablet Commonly known as: TOPROL-XL Take 12.5 mg by mouth daily.   multivitamin with minerals Tabs tablet Take 1 tablet by mouth daily. Start taking on: November 29, 2022   nitroGLYCERIN 0.4 MG SL tablet Commonly known as: NITROSTAT Place 0.4 mg under the tongue every 5 (five) minutes x 3 doses as needed for chest pain.   polyethylene glycol 17 g packet Commonly known as: MIRALAX / GLYCOLAX Take 17 g by mouth daily. Hold for loose or frequent stools Start taking on: November 29, 2022   senna 8.6 MG Tabs tablet Commonly known as: SENOKOT Take 1 tablet (8.6 mg total) by mouth 2 (two) times daily. Hold for loose or frequent stools   traZODone 50 MG tablet Commonly known as: DESYREL Take 50 mg by mouth at bedtime.               Discharge Care Instructions  (From admission, onward)  Start     Ordered   11/28/22 0000  Leave dressing on - Keep it clean, dry, and intact until clinic visit        11/28/22 1041            Follow-up Information     Deeann Saint, MD Follow up in 2 week(s).   Specialty: Orthopedic Surgery Why: For suture removal, For wound re-check Please make an appointment for my office in 2 weeks prior to the patient's discharge Contact information: [address] [place] [address] [phone]                Discharge Exam: Filed Weights   11/26/22 0409 11/27/22 0453 11/28/22 0536  Weight: 99.1 kg 95.6 kg 95.3 kg   General exam: awake, alert, no acute distress HEENT: moist mucus membranes, hearing grossly normal  Respiratory system: CTAB, no wheezes, rales or rhonchi, normal respiratory effort. Cardiovascular system: normal S1/S2, RRR,no pedal edema.   Gastrointestinal system: soft, NT, ND, no HSM felt, +bowel sounds. Central nervous system: A&O x 3. no gross focal neurologic deficits, normal speech Extremities: right hip dressing dry, no  edema, normal tone Skin: dry, intact, normal temperature Psychiatry: normal mood, congruent affect, judgement and insight appear normal   Condition at discharge: stable  The results of significant diagnostics from this hospitalization (including imaging, microbiology, ancillary and laboratory) are listed below for reference.   Imaging Studies: DG HIP UNILAT WITH PELVIS 2-3 VIEWS RIGHT  Result Date: 11/23/2022 CLINICAL DATA:  Right femoral IM nail placement EXAM: DG HIP (WITH OR WITHOUT PELVIS) 2-3V RIGHT COMPARISON:  None Available. FINDINGS: Multiple intraoperative fluoroscopic spot images are provided. Right intertrochanteric fracture transfixed with a right femoral intramedullary nail and interlocking cannulated femoral neck screw. Medial displacement of the lesser trochanteric fracture fragment. FLUOROSCOPY TIME:  Radiation Exposure Index: 39.32 mGy IMPRESSION: 1. Right intertrochanteric fracture transfixed with a right femoral intramedullary nail and interlocking cannulated femoral neck screw. Electronically Signed   By: Elige Ko M.D.   On: 11/23/2022 12:52   DG C-Arm 1-60 Min-No Report  Result Date: 11/23/2022 Fluoroscopy was utilized by the requesting physician.  No radiographic interpretation.   CT ANGIO AO+BIFEM W & OR WO CONTRAST  Result Date: 11/22/2022 CLINICAL DATA:  Evaluate for lower extremity ischemia. Status post intertrochanteric fracture of the proximal right femur. Right leg appears cool lower than left. Complains of cramping sensation to the right leg. EXAM: CT ANGIOGRAPHY OF ABDOMINAL AORTA WITH ILIOFEMORAL RUNOFF TECHNIQUE: Multidetector CT imaging of the abdomen, pelvis and lower extremities was performed using the standard protocol during bolus administration of intravenous contrast. Multiplanar CT image reconstructions and MIPs were obtained to evaluate the vascular anatomy. RADIATION DOSE REDUCTION: This exam was performed according to the departmental  dose-optimization program which includes automated exposure control, adjustment of the mA and/or kV according to patient size and/or use of iterative reconstruction technique. CONTRAST:  80mL OMNIPAQUE IOHEXOL 350 MG/ML SOLN COMPARISON:  None Available. FINDINGS: VASCULAR Aorta: Normal caliber aorta without aneurysm, dissection, vasculitis or significant stenosis. Extensive aortic atherosclerotic calcifications identified. Celiac: Patent without evidence of aneurysm, dissection, vasculitis or significant stenosis. SMA: Patent without evidence of aneurysm, dissection, vasculitis or significant stenosis. Renals: Both renal arteries are patent without evidence of aneurysm, dissection, vasculitis, fibromuscular dysplasia or significant stenosis. IMA: Patent without evidence of aneurysm, dissection, vasculitis or significant stenosis. RIGHT Lower Extremity Inflow: Common, internal and external iliac arteries are patent without evidence of aneurysm, dissection, vasculitis or significant stenosis. Outflow:  Common, superficial and profunda femoral arteries and the popliteal artery are patent without evidence of aneurysm, dissection, vasculitis or significant stenosis. Runoff: Patent three vessel runoff to the ankle. Multiple areas of calcified and noncalcified eccentric mural plaque identified. Multiple areas of high-grade luminal narrowing identified involving the anterior tibial artery with contrast opacification of the vessel noted to the level of the ankle. LEFT Lower Extremity Inflow: Common, internal and external iliac arteries are patent without evidence of aneurysm, dissection, vasculitis or significant stenosis. Outflow: Common, superficial and profunda femoral arteries and the popliteal artery are patent without evidence of aneurysm, dissection, vasculitis or significant stenosis. Runoff: Patent three vessel runoff to the ankle. Similar to the contralateral side is extensive atherosclerotic disease involving the  lower extremity arterial vessels. Multiple areas of high-grade stenoses distal to the bifurcation of the popliteal artery with distal contrast opacification of the vessels to the level of the ankle. Veins: No obvious venous abnormality within the limitations of this arterial phase study. Review of the MIP images confirms the above findings. NON-VASCULAR Lower chest: No acute abnormality. Hepatobiliary: No focal liver abnormality is seen. No gallstones, gallbladder wall thickening, or biliary dilatation. Pancreas: Unremarkable. No pancreatic ductal dilatation or surrounding inflammatory changes. Spleen: Normal in size without focal abnormality. Adrenals/Urinary Tract: Adrenal glands are unremarkable. Kidneys are normal, without renal calculi, focal lesion, or hydronephrosis. Bladder is unremarkable. Stomach/Bowel: Small hiatal hernia. The appendix is visualized and appears normal. There is no bowel wall thickening, inflammation, or distension identified. Lymphatic: No signs of abdominopelvic adenopathy. Reproductive: Prostate is unremarkable. Other: No ascites or focal fluid collections. Musculoskeletal: There is an acute and comminuted fracture deformity involving the intertrochanteric portions of the proximal right femur. Small bilateral joint effusions are noted within the suprapatellar joint space of both knees. Moderate to severe medial compartment osteoarthritis is noted within both knees. IMPRESSION: 1. No acute findings within the abdomen or pelvis. 2. Acute and comminuted fracture deformity involving the intertrochanteric portions of the proximal right femur. 3. Extensive atherosclerotic disease involving the lower extremity arterial vessels. Multiple areas of high-grade luminal narrowing identified involving the anterior tibial arteries bilaterally with distal contrast opacification of the vessels to the level of the ankle. 4. Small hiatal hernia. 5. Moderate to severe medial compartment osteoarthritis  within both knees. 6.  Aortic Atherosclerosis (ICD10-I70.0). Electronically Signed   By: Signa Kell M.D.   On: 11/22/2022 18:58   DG Chest 1 View  Result Date: 11/22/2022 CLINICAL DATA:  Right hip fracture, weakness EXAM: CHEST  1 VIEW COMPARISON:  09/13/2018 chest radiograph. FINDINGS: Intact sternotomy wires. CABG clips overlie the left mediastinum. Stable cardiomediastinal silhouette with normal heart size. No pneumothorax. No pleural effusion. Lungs appear clear, with no acute consolidative airspace disease and no pulmonary edema. IMPRESSION: No active disease. Electronically Signed   By: Delbert Phenix M.D.   On: 11/22/2022 15:15   DG Hip Unilat With Pelvis 2-3 Views Right  Result Date: 11/22/2022 CLINICAL DATA:  Right lower extremity pain, numbness and weakness EXAM: DG HIP (WITH OR WITHOUT PELVIS) 2-3V RIGHT COMPARISON:  None Available. FINDINGS: Comminuted intertrochanteric proximal right femur fracture with 2 cm overriding of the fracture fragments and 1 cm medial displacement of the lesser trochanter fracture fragment. No dislocation at the right hip joint. No pelvic diastasis. Mild bilateral hip osteoarthritis. No suspicious focal osseous lesions. Mild degenerative changes in the visualized lower lumbar spine. Surgical clip overlies the medial upper right thigh soft tissues. IMPRESSION: 1. Comminuted intertrochanteric proximal right femur  fracture as detailed. 2. Mild bilateral hip osteoarthritis. Electronically Signed   By: Delbert Phenix M.D.   On: 11/22/2022 15:14    Microbiology: Results for orders placed or performed during the hospital encounter of 11/22/22  Culture, blood (Routine X 2) w Reflex to ID Panel     Status: None   Collection Time: 11/23/22 12:09 PM   Specimen: BLOOD RIGHT ARM  Result Value Ref Range Status   Specimen Description BLOOD RIGHT ARM  Final   Special Requests   Final    BOTTLES DRAWN AEROBIC ONLY Blood Culture adequate volume   Culture   Final    NO GROWTH  5 DAYS Performed at [facility], [address] [place], [place] [address]    Report Status 11/28/2022 FINAL  Final  Culture, blood (Routine X 2) w Reflex to ID Panel     Status: None   Collection Time: 11/23/22 12:10 PM   Specimen: BLOOD  Result Value Ref Range Status   Specimen Description BLOOD BLOOD RIGHT HAND  Final   Special Requests   Final    BOTTLES DRAWN AEROBIC AND ANAEROBIC Blood Culture adequate volume   Culture   Final    NO GROWTH 5 DAYS Performed at [facility], [address] [place], [place] [address]    Report Status 11/28/2022 FINAL  Final    Labs: CBC: Recent Labs  Lab 11/22/22 1244 11/23/22 0433 11/24/22 0356 11/25/22 0330 11/26/22 0330  WBC 22.4* 12.4* 12.6* 9.4 8.6  HGB 9.9* 8.2* 7.7* 8.7* 8.8*  HCT 32.6* 25.0* 25.0* 26.9* 27.6*  MCV 77.3* 72.7* 76.2* 74.9* 75.6*  PLT 374 289 267 245 273   Basic Metabolic Panel: Recent Labs  Lab 11/23/22 0433 11/24/22 0356 11/25/22 0330 11/26/22 0330 11/28/22 0253  NA 133* 131* 131* 131* 136  K 4.3 4.6 4.3 3.7 3.7  CL 104 100 97* 98 101  CO2 24 23 26 26 26   GLUCOSE 213* 196* 249* 231* 120*  BUN 25* 24* 22 16 17   CREATININE 0.89 0.89 0.87 0.71 0.74  CALCIUM 8.2* 8.2* 8.2* 8.1* 8.5*   Liver Function Tests: Recent Labs  Lab 11/23/22 0433  AST 63*  ALT 20  ALKPHOS 61  BILITOT 1.2  PROT 5.2*  ALBUMIN 2.9*   CBG: Recent Labs  Lab 11/27/22 0720 11/27/22 1139 11/27/22 1649 11/27/22 2043 11/28/22 0751  GLUCAP 188* 232* 173* 218* 194*    Discharge time spent: greater than 30 minutes.  Signed: Pennie Banter, DO [facility] 11/28/2022

## 2022-11-28 NOTE — Plan of Care (Signed)

## 2022-11-28 NOTE — TOC Progression Note (Signed)
Transition of Care [facility]) - Progression Note    Patient Details  Name: Bruce Lucas MRN: [id number] Date of Birth: 07-21-1960  Transition of Care [facility]) CM/SW Contact  Marlowe Sax, RN Phone Number: 11/28/2022, 11:20 AM  Clinical Narrative:     Going to [facility] rehab room 107, notified Daughter Lupe Carney and the patient, EMS called to arrange transport        Expected Discharge Plan and Services         Expected Discharge Date: 11/28/22                                     Social Determinants of Health (SDOH) Interventions SDOH Screenings   Food Insecurity: No Food Insecurity (11/23/2022)  Housing: Low Risk  (11/23/2022)  Transportation Needs: No Transportation Needs (11/23/2022)  Utilities: Not At Risk (11/23/2022)  Depression (PHQ2-9): Low Risk  (05/15/2021)  Financial Resource Strain: Low Risk  (08/26/2022)   Received from [facility], [facility]  Social Connections: Unknown (08/17/2021)   Received from [facility], [facility]  Tobacco Use: Medium Risk (11/22/2022)    Readmission Risk Interventions     No data to display

## 2022-11-29 ENCOUNTER — Non-Acute Institutional Stay (SKILLED_NURSING_FACILITY): Payer: PPO | Admitting: Student

## 2022-11-29 ENCOUNTER — Encounter: Payer: Self-pay | Admitting: Student

## 2022-11-29 DIAGNOSIS — E10319 Type 1 diabetes mellitus with unspecified diabetic retinopathy without macular edema: Secondary | ICD-10-CM

## 2022-11-29 DIAGNOSIS — L89892 Pressure ulcer of other site, stage 2: Secondary | ICD-10-CM | POA: Diagnosis not present

## 2022-11-29 DIAGNOSIS — I10 Essential (primary) hypertension: Secondary | ICD-10-CM | POA: Diagnosis not present

## 2022-11-29 DIAGNOSIS — S728X1A Other fracture of right femur, initial encounter for closed fracture: Secondary | ICD-10-CM

## 2022-11-29 DIAGNOSIS — I2510 Atherosclerotic heart disease of native coronary artery without angina pectoris: Secondary | ICD-10-CM

## 2022-11-29 DIAGNOSIS — D649 Anemia, unspecified: Secondary | ICD-10-CM | POA: Diagnosis not present

## 2022-11-29 NOTE — Progress Notes (Unsigned)
Provider:  Dr. Earnestine Mealing Location:  Other [place].  Nursing Home Room Number: [facility] 107A Place of Service:  SNF (31)  PCP: [name], MD Patient Care Team: [name], MD as PCP - General (Internal Medicine)  Extended Emergency Contact Information Primary Emergency Contact: [name],[name] Mobile Phone: [phone] Relation: Significant other Secondary Emergency Contact: Fritze,[name] Mobile Phone: [phone] Relation: Daughter  Code Status: Full Code Goals of Care: Advanced Directive information    11/29/2022    9:03 AM  Advanced Directives  Does Patient Have a Medical Advance Directive? No  Would patient like information on creating a medical advance directive? No - Patient declined      Chief Complaint  Patient presents with  . New Admit To SNF    Admission.     HPI: Patient is a 62 y.o. male seen today for admission to [facility] who was admitted to the hospital after a fall in his home and suffered a femur fracture on 8/16. S/p IM nail 11/23/22.   He gives his name birthday day of the week type of facility and  He went ot the bathroom and had low glucose and fell off the toilet and busted his leg. He needed help to get his doors locked and [place] [facility] came to get him to go to the hospital. Pain well-controlled on current medication. He was very confused in the hosptial. He is urinating well and having good bowel movements. He is eating well.   He has been diabetic since he was 62 years old. He doesn't heal well and can't get good sleep. His A1c stays around 7, most recent 6.9 last week. He has been taking FIASP insulin and that has helped his sugar, but it could be why he had low glucose. He goes for walks often for exercise and his sugars would still increase. He has been on the pure insulin for 3 mo now.   He lives alone in [place] and alone. Independent. Has disability.   Since starting sertraline he was having trouble  with hislegs moving too much at night.   Past Medical History:  Diagnosis Date  . AKI (acute kidney injury) (HCC) 11/23/2022  . Coronary artery disease   . Diabetes mellitus (HCC)    Type 1  . Diabetic peripheral neuropathy (HCC)   . Diabetic retinopathy (HCC)   . Hyperlipidemia   . Hypertension   . Hyponatremia 11/23/2022  . Sepsis (HCC) 11/23/2022  . Stroke [facility])    06/2018   Past Surgical History:  Procedure Laterality Date  . CARDIAC SURGERY    . CORONARY ARTERY BYPASS GRAFT     09/03/18  . EYE SURGERY     Per patient, for diabetic retinopathy, right eye  . INTRAMEDULLARY (IM) NAIL INTERTROCHANTERIC Right 11/23/2022   Procedure: INTRAMEDULLARY (IM) NAIL INTERTROCHANTERIC;  Surgeon: Deeann Saint, MD;  Location: [facility] ORS;  Service: Orthopedics;  Laterality: Right;  . KNEE SURGERY Bilateral   . TONSILLECTOMY      reports that he quit smoking about 4 years ago. His smoking use included cigarettes. He started smoking about 14 years ago. He has a 5 pack-year smoking history. He has never used smokeless tobacco. He reports that he does not drink alcohol and does not use drugs. Social History   Socioeconomic History  . Marital status: Divorced    Spouse name: Not on file  . Number of children: Not on file  . Years of education: Not on file  .  Highest education level: Not on file  Occupational History  . Not on file  Tobacco Use  . Smoking status: Former    Current packs/day: 0.00    Average packs/day: 0.5 packs/day for 10.0 years (5.0 ttl pk-yrs)    Types: Cigarettes    Start date: 04/08/2008    Quit date: 04/08/2018    Years since quitting: 4.6  . Smokeless tobacco: Never  . 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 Vaping status: Never Used  Substance and Sexual Activity  . Alcohol use: No  . Drug use: No  . Sexual activity: Not on file  Other Topics Concern  . Not on file  Social History Narrative  . Not on file   Social Determinants of Health   Financial  Resource Strain: Low Risk  (08/26/2022)   [facility], [facility]   Overall Financial Resource Strain (CARDIA)   . Difficulty of Paying Living Expenses: Not hard at all  Food Insecurity: No Food Insecurity (11/23/2022)   Hunger Vital Sign   . Worried About Programme researcher, broadcasting/film/video in the Last Year: Never true   . Ran Out of Food in the Last Year: Never true  Transportation Needs: No Transportation Needs (11/23/2022)   PRAPARE - Transportation   . Lack of Transportation (Medical): No   . Lack of Transportation (Non-Medical): No  Physical Activity: Not on file  Stress: Not on file  Social Connections: Unknown (08/17/2021)   [facility], [facility]   Social Network   . Social Network: Not on file  Intimate Partner Violence: Not At Risk (11/23/2022)   Humiliation, Afraid, Rape, and Kick questionnaire   . Fear of Current or Ex-Partner: No   . Emotionally Abused: No   . Physically Abused: No   . Sexually Abused: No    Functional Status Survey:    Family History  Problem Relation Age of Onset  . Brain cancer Mother   . Hypertension Father     Health Maintenance  Topic Date Due  . FOOT EXAM  Never done  . OPHTHALMOLOGY EXAM  Never done  . Diabetic kidney evaluation - Urine ACR  Never done  . Hepatitis C Screening  Never done  . DTaP/Tdap/Td (1 - Tdap) Never done  . Colonoscopy  Never done  . Zoster Vaccines- Shingrix (1 of 2) Never done  . COVID-19 Vaccine (1 - 2023-24 season) Never done  . INFLUENZA VACCINE  11/07/2022  . HEMOGLOBIN A1C  05/25/2023  . Medicare Annual Wellness (AWV)  08/26/2023  . Diabetic kidney evaluation - eGFR measurement  11/28/2023  . HIV Screening  Completed  . HPV VACCINES  Aged Out    Allergies  Allergen Reactions  . Codeine   . Penicillins     Outpatient Encounter Medications as of 11/29/2022  Medication Sig  . acetaminophen (TYLENOL) 325 MG tablet Take 1-2 tablets (325-650 mg  total) by mouth every 6 (six) hours as needed for mild pain (pain score 1-3 or temp > 100.5).  Marland Kitchen alum & mag hydroxide-simeth (MAALOX/MYLANTA) 200-200-20 MG/5ML suspension Take 30 mLs by mouth every 4 (four) hours as needed for indigestion.  Marland Kitchen aspirin EC 81 MG tablet Take 81 mg by mouth daily.  Marland Kitchen atorvastatin (LIPITOR) 40 MG tablet Take 1 tablet (40 mg total) by mouth daily.  . bisacodyl (DULCOLAX) 10 MG suppository Place 1 suppository (10 mg total) rectally daily as needed for moderate constipation.  Marland Kitchen  cyanocobalamin 1000 MCG tablet Take 1 tablet by mouth daily.  . DULoxetine (CYMBALTA) 30 MG capsule Take 30 mg by mouth daily.  Marland Kitchen enoxaparin (LOVENOX) 40 MG/0.4ML injection Inject 0.4 mLs (40 mg total) into the skin daily for 10 days.  . ferrous sulfate 325 (65 FE) MG tablet Take 1 tablet (325 mg total) by mouth every other day.  Marland Kitchen HYDROcodone-acetaminophen (NORCO/VICODIN) 5-325 MG tablet Take 1-2 tablets by mouth every 4 (four) hours as needed for moderate pain or severe pain.  . hydrOXYzine (ATARAX) 25 MG tablet Take 1 tablet (25 mg total) by mouth 3 (three) times daily as needed for anxiety.  . insulin aspart (NOVOLOG) 100 UNIT/ML injection Inject 11 Units into the skin 3 (three) times daily with meals.  . insulin glargine (LANTUS SOLOSTAR) 100 UNIT/ML Solostar Pen Inject 24 Units into the skin at bedtime.  Marland Kitchen losartan (COZAAR) 25 MG tablet Take 12.5 mg by mouth daily.  . Melatonin 10 MG TABS Take 10 mg by mouth at bedtime.  . metoprolol succinate (TOPROL-XL) 25 MG 24 hr tablet Take 12.5 mg by mouth daily.  . Multiple Vitamin (MULTIVITAMIN WITH MINERALS) TABS tablet Take 1 tablet by mouth daily.  . nitroGLYCERIN (NITROSTAT) 0.4 MG SL tablet Place 0.4 mg under the tongue every 5 (five) minutes x 3 doses as needed for chest pain.  . polyethylene glycol (MIRALAX / GLYCOLAX) 17 g packet Take 17 g by mouth daily. Hold for loose or frequent stools  . senna (SENOKOT) 8.6 MG TABS tablet Take 1 tablet (8.6  mg total) by mouth 2 (two) times daily. Hold for loose or frequent stools  . traZODone (DESYREL) 50 MG tablet Take 50 mg by mouth at bedtime.  . [DISCONTINUED] insulin aspart (NOVOLOG) 100 UNIT/ML injection Inject 0-9 Units into the skin 3 (three) times daily with meals.   No facility-administered encounter medications on file as of 11/29/2022.    Review of Systems  Vitals:   11/29/22 0843  BP: (!) 151/74  Pulse: 84  Resp: 18  Temp: 97.8 F (36.6 C)  SpO2: 96%  Weight: 204 lb 12.8 oz (92.9 kg)  Height: 5\' 11"  (1.803 m)   Body mass index is 28.56 kg/m. Physical Exam  Labs reviewed: Basic Metabolic Panel: Recent Labs    11/25/22 0330 11/26/22 0330 11/28/22 0253  NA 131* 131* 136  K 4.3 3.7 3.7  CL 97* 98 101  CO2 26 26 26   GLUCOSE 249* 231* 120*  BUN 22 16 17   CREATININE 0.87 0.71 0.74  CALCIUM 8.2* 8.1* 8.5*   Liver Function Tests: Recent Labs    11/23/22 0433  AST 63*  ALT 20  ALKPHOS 61  BILITOT 1.2  PROT 5.2*  ALBUMIN 2.9*   No results for input(s): "LIPASE", "AMYLASE" in the last 8760 hours. No results for input(s): "AMMONIA" in the last 8760 hours. CBC: Recent Labs    11/24/22 0356 11/25/22 0330 11/26/22 0330  WBC 12.6* 9.4 8.6  HGB 7.7* 8.7* 8.8*  HCT 25.0* 26.9* 27.6*  MCV 76.2* 74.9* 75.6*  PLT 267 245 273   Cardiac Enzymes: Recent Labs    11/22/22 1244  CKTOTAL 102   BNP: Invalid input(s): "POCBNP" Lab Results  Component Value Date   HGBA1C 6.9 (H) 11/22/2022   No results found for: "TSH" No results found for: "VITAMINB12" No results found for: "FOLATE" No results found for: "IRON", "TIBC", "FERRITIN"  Imaging and Procedures obtained prior to SNF admission: DG HIP UNILAT WITH PELVIS 2-3 VIEWS  RIGHT  Result Date: 11/23/2022 CLINICAL DATA:  Right femoral IM nail placement EXAM: DG HIP (WITH OR WITHOUT PELVIS) 2-3V RIGHT COMPARISON:  None Available. FINDINGS: Multiple intraoperative fluoroscopic spot images are provided. Right  intertrochanteric fracture transfixed with a right femoral intramedullary nail and interlocking cannulated femoral neck screw. Medial displacement of the lesser trochanteric fracture fragment. FLUOROSCOPY TIME:  Radiation Exposure Index: 39.32 mGy IMPRESSION: 1. Right intertrochanteric fracture transfixed with a right femoral intramedullary nail and interlocking cannulated femoral neck screw. Electronically Signed   By: Elige Ko M.D.   On: 11/23/2022 12:52   DG C-Arm 1-60 Min-No Report  Result Date: 11/23/2022 Fluoroscopy was utilized by the requesting physician.  No radiographic interpretation.   CT ANGIO AO+BIFEM W & OR WO CONTRAST  Result Date: 11/22/2022 CLINICAL DATA:  Evaluate for lower extremity ischemia. Status post intertrochanteric fracture of the proximal right femur. Right leg appears cool lower than left. Complains of cramping sensation to the right leg. EXAM: CT ANGIOGRAPHY OF ABDOMINAL AORTA WITH ILIOFEMORAL RUNOFF TECHNIQUE: Multidetector CT imaging of the abdomen, pelvis and lower extremities was performed using the standard protocol during bolus administration of intravenous contrast. Multiplanar CT image reconstructions and MIPs were obtained to evaluate the vascular anatomy. RADIATION DOSE REDUCTION: This exam was performed according to the departmental dose-optimization program which includes automated exposure control, adjustment of the mA and/or kV according to patient size and/or use of iterative reconstruction technique. CONTRAST:  80mL OMNIPAQUE IOHEXOL 350 MG/ML SOLN COMPARISON:  None Available. FINDINGS: VASCULAR Aorta: Normal caliber aorta without aneurysm, dissection, vasculitis or significant stenosis. Extensive aortic atherosclerotic calcifications identified. Celiac: Patent without evidence of aneurysm, dissection, vasculitis or significant stenosis. SMA: Patent without evidence of aneurysm, dissection, vasculitis or significant stenosis. Renals: Both renal arteries are  patent without evidence of aneurysm, dissection, vasculitis, fibromuscular dysplasia or significant stenosis. IMA: Patent without evidence of aneurysm, dissection, vasculitis or significant stenosis. RIGHT Lower Extremity Inflow: Common, internal and external iliac arteries are patent without evidence of aneurysm, dissection, vasculitis or significant stenosis. Outflow: Common, superficial and profunda femoral arteries and the popliteal artery are patent without evidence of aneurysm, dissection, vasculitis or significant stenosis. Runoff: Patent three vessel runoff to the ankle. Multiple areas of calcified and noncalcified eccentric mural plaque identified. Multiple areas of high-grade luminal narrowing identified involving the anterior tibial artery with contrast opacification of the vessel noted to the level of the ankle. LEFT Lower Extremity Inflow: Common, internal and external iliac arteries are patent without evidence of aneurysm, dissection, vasculitis or significant stenosis. Outflow: Common, superficial and profunda femoral arteries and the popliteal artery are patent without evidence of aneurysm, dissection, vasculitis or significant stenosis. Runoff: Patent three vessel runoff to the ankle. Similar to the contralateral side is extensive atherosclerotic disease involving the lower extremity arterial vessels. Multiple areas of high-grade stenoses distal to the bifurcation of the popliteal artery with distal contrast opacification of the vessels to the level of the ankle. Veins: No obvious venous abnormality within the limitations of this arterial phase study. Review of the MIP images confirms the above findings. NON-VASCULAR Lower chest: No acute abnormality. Hepatobiliary: No focal liver abnormality is seen. No gallstones, gallbladder wall thickening, or biliary dilatation. Pancreas: Unremarkable. No pancreatic ductal dilatation or surrounding inflammatory changes. Spleen: Normal in size without focal  abnormality. Adrenals/Urinary Tract: Adrenal glands are unremarkable. Kidneys are normal, without renal calculi, focal lesion, or hydronephrosis. Bladder is unremarkable. Stomach/Bowel: Small hiatal hernia. The appendix is visualized and appears normal. There is no bowel  wall thickening, inflammation, or distension identified. Lymphatic: No signs of abdominopelvic adenopathy. Reproductive: Prostate is unremarkable. Other: No ascites or focal fluid collections. Musculoskeletal: There is an acute and comminuted fracture deformity involving the intertrochanteric portions of the proximal right femur. Small bilateral joint effusions are noted within the suprapatellar joint space of both knees. Moderate to severe medial compartment osteoarthritis is noted within both knees. IMPRESSION: 1. No acute findings within the abdomen or pelvis. 2. Acute and comminuted fracture deformity involving the intertrochanteric portions of the proximal right femur. 3. Extensive atherosclerotic disease involving the lower extremity arterial vessels. Multiple areas of high-grade luminal narrowing identified involving the anterior tibial arteries bilaterally with distal contrast opacification of the vessels to the level of the ankle. 4. Small hiatal hernia. 5. 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 6.  Aortic Atherosclerosis (ICD10-I70.0). Electronically Signed   By: Signa Kell M.D.   On: 11/22/2022 18:58   DG Chest 1 View  Result Date: 11/22/2022 CLINICAL DATA:  Right hip fracture, weakness EXAM: CHEST  1 VIEW COMPARISON:  09/13/2018 chest radiograph. FINDINGS: Intact sternotomy wires. CABG clips overlie the left mediastinum. Stable cardiomediastinal silhouette with normal heart size. No pneumothorax. No pleural effusion. Lungs appear clear, with no acute consolidative airspace disease and no pulmonary edema. IMPRESSION: No active disease. Electronically Signed   By: Delbert Phenix M.D.   On: 11/22/2022  15:15   DG Hip Unilat With Pelvis 2-3 Views Right  Result Date: 11/22/2022 CLINICAL DATA:  Right lower extremity pain, numbness and weakness EXAM: DG HIP (WITH OR WITHOUT PELVIS) 2-3V RIGHT COMPARISON:  None Available. FINDINGS: Comminuted intertrochanteric proximal right femur fracture with 2 cm overriding of the fracture fragments and 1 cm medial displacement of the lesser trochanter fracture fragment. No dislocation at the right hip joint. No pelvic diastasis. Mild bilateral hip osteoarthritis. No suspicious focal osseous lesions. Mild degenerative changes in the visualized lower lumbar spine. Surgical clip overlies the medial upper right thigh soft tissues. IMPRESSION: 1. Comminuted intertrochanteric proximal right femur fracture as detailed. 2. Mild bilateral hip osteoarthritis. Electronically Signed   By: Delbert Phenix M.D.   On: 11/22/2022 15:14    Assessment/Plan There are no diagnoses linked to this encounter.   Family/ staff Communication:   Labs/tests ordered:

## 2022-12-01 ENCOUNTER — Encounter: Payer: Self-pay | Admitting: Student

## 2022-12-02 ENCOUNTER — Encounter: Payer: Self-pay | Admitting: Student

## 2022-12-02 DIAGNOSIS — L89892 Pressure ulcer of other site, stage 2: Secondary | ICD-10-CM | POA: Insufficient documentation

## 2022-12-06 ENCOUNTER — Encounter: Admission: RE | Payer: Self-pay | Source: Home / Self Care

## 2022-12-06 ENCOUNTER — Ambulatory Visit: Admission: RE | Admit: 2022-12-06 | Payer: PPO | Source: Home / Self Care

## 2022-12-06 SURGERY — COLONOSCOPY WITH PROPOFOL
Anesthesia: General

## 2022-12-10 ENCOUNTER — Encounter: Payer: Self-pay | Admitting: Nurse Practitioner

## 2022-12-10 ENCOUNTER — Non-Acute Institutional Stay (SKILLED_NURSING_FACILITY): Payer: PPO | Admitting: Nurse Practitioner

## 2022-12-10 DIAGNOSIS — E10319 Type 1 diabetes mellitus with unspecified diabetic retinopathy without macular edema: Secondary | ICD-10-CM

## 2022-12-10 NOTE — Progress Notes (Signed)
Location:  Other [place].  Nursing Home Room Number: [facility] 107A Place of Service:  SNF [phone]) [name], NP  PCP: [name], MD  Patient Care Team: [name], MD as PCP - General (Internal Medicine)  Extended Emergency Contact Information Primary Emergency Contact: [name],[name] Mobile Phone: [phone] Relation: Significant other Secondary Emergency Contact: Laughter,[name] Mobile Phone: [phone] Relation: Daughter  Goals of care: Advanced Directive information    12/10/2022   11:27 AM  Advanced Directives  Does Patient Have a Medical Advance Directive? No  Would patient like information on creating a medical advance directive? No - Patient declined     Chief Complaint  Patient presents with   Acute Visit    Elevated Blood Sugar.     HPI:  Pt is a 63 y.o. male seen today for an acute visit for Elevated Blood Sugar.  Pt is type 1 diabetic and has been on insulin since 8 years ago.  He states he was on fiasp prior to admission which caused his blood sugar to bottom out. He has also been on an increase dose of lantus that has been titrated down prior to admission He is eating regularly at this time.  He wants meal time insulin increased   Past Medical History:  Diagnosis Date   AKI (acute kidney injury) (HCC) 11/23/2022   Coronary artery disease    Diabetes mellitus (HCC)    Type 1   Diabetic peripheral neuropathy (HCC)    Diabetic retinopathy (HCC)    Hyperlipidemia    Hypertension    Hyponatremia 11/23/2022   Sepsis (HCC) 11/23/2022   Stroke (HCC)    06/2018   Past Surgical History:  Procedure Laterality Date   CARDIAC SURGERY     CORONARY ARTERY BYPASS GRAFT     09/03/18   EYE SURGERY     Per patient, for diabetic retinopathy, right eye   INTRAMEDULLARY (IM) NAIL INTERTROCHANTERIC Right 11/23/2022   Procedure: INTRAMEDULLARY (IM) NAIL INTERTROCHANTERIC;  Surgeon: Deeann Saint, MD;  Location: [facility] ORS;  Service:  Orthopedics;  Laterality: Right;   KNEE SURGERY Bilateral    TONSILLECTOMY      Allergies  Allergen Reactions   Codeine    Penicillins     Outpatient Encounter Medications as of 12/10/2022  Medication Sig   acetaminophen (TYLENOL) 325 MG tablet Take 1-2 tablets (325-650 mg total) by mouth every 6 (six) hours as needed for mild pain (pain score 1-3 or temp > 100.5).   alum & mag hydroxide-simeth (MAALOX/MYLANTA) 200-200-20 MG/5ML suspension Take 30 mLs by mouth every 4 (four) hours as needed for indigestion.   aspirin EC 81 MG tablet Take 81 mg by mouth daily.   atorvastatin (LIPITOR) 40 MG tablet Take 1 tablet (40 mg total) by mouth daily.   bisacodyl (DULCOLAX) 10 MG suppository Place 1 suppository (10 mg total) rectally daily as needed for moderate constipation.   cyanocobalamin 1000 MCG tablet Take 1 tablet by mouth daily.   DULoxetine (CYMBALTA) 30 MG capsule Take 30 mg by mouth daily.   ferrous sulfate 325 (65 FE) MG tablet Take 1 tablet (325 mg total) by mouth every other day.   HYDROcodone-acetaminophen (NORCO/VICODIN) 5-325 MG tablet Take 1-2 tablets by mouth every 4 (four) hours as needed for moderate pain or severe pain.   insulin aspart (NOVOLOG) 100 UNIT/ML injection Inject 11 Units into the skin 3 (three) times daily with meals. (Patient taking differently: Inject 11 Units into the skin 3 (three) times daily  with meals. Inject as per sliding scale: if 200-249=1 unit, 250-299=2 units, 300-349=3 units, 350-400= 4 units, 401+ Call Provider)   insulin glargine (LANTUS) 100 UNIT/ML injection Inject 15 Units into the skin at bedtime.   losartan (COZAAR) 25 MG tablet Take 12.5 mg by mouth daily.   Melatonin 10 MG TABS Take 10 mg by mouth at bedtime.   metoprolol succinate (TOPROL-XL) 25 MG 24 hr tablet Take 12.5 mg by mouth daily.   Multiple Vitamin (MULTIVITAMIN WITH MINERALS) TABS tablet Take 1 tablet by mouth daily.   nitroGLYCERIN (NITROSTAT) 0.4 MG SL tablet Place 0.4 mg under  the tongue every 5 (five) minutes x 3 doses as needed for chest pain.   polyethylene glycol (MIRALAX / GLYCOLAX) 17 g packet Take 17 g by mouth daily. Hold for loose or frequent stools   senna (SENOKOT) 8.6 MG TABS tablet Take 1 tablet (8.6 mg total) by mouth 2 (two) times daily. Hold for loose or frequent stools   traZODone (DESYREL) 50 MG tablet Take 50 mg by mouth at bedtime.   [DISCONTINUED] enoxaparin (LOVENOX) 40 MG/0.4ML injection Inject 0.4 mLs (40 mg total) into the skin daily for 10 days.   [DISCONTINUED] hydrOXYzine (ATARAX) 25 MG tablet Take 1 tablet (25 mg total) by mouth 3 (three) times daily as needed for anxiety.   [DISCONTINUED] insulin glargine (LANTUS SOLOSTAR) 100 UNIT/ML Solostar Pen Inject 24 Units into the skin at bedtime. (Patient taking differently: Inject 15 Units into the skin at bedtime.)   No facility-administered encounter medications on file as of 12/10/2022.    Review of Systems  Constitutional:  Negative for activity change, appetite change, fatigue and unexpected weight change.  HENT:  Negative for congestion and hearing loss.   Eyes: Negative.   Respiratory:  Negative for cough and shortness of breath.   Cardiovascular:  Negative for chest pain, palpitations and leg swelling.  Gastrointestinal:  Negative for abdominal pain, constipation and diarrhea.  Genitourinary:  Negative for difficulty urinating and dysuria.  Musculoskeletal:  Negative for arthralgias and myalgias.  Skin:  Negative for color change and wound.  Neurological:  Negative for dizziness and weakness.  Psychiatric/Behavioral:  Negative for agitation, behavioral problems and confusion.     Immunization History  Administered Date(s) Administered   Pneumococcal Polysaccharide-23 03/06/2012, 10/07/2018   Pertinent  Health Maintenance Due  Topic Date Due   FOOT EXAM  Never done   OPHTHALMOLOGY EXAM  Never done   Colonoscopy  Never done   INFLUENZA VACCINE  Never done   HEMOGLOBIN A1C   05/25/2023      09/13/2018    1:51 PM 10/30/2018   11:09 AM 05/15/2021    1:43 PM 06/25/2021    1:54 PM  Fall Risk  Falls in the past year?  0 0 0  (RETIRED) Patient Fall Risk Level Low fall risk Moderate fall risk Low fall risk   Patient at Risk for Falls Due to  Impaired balance/gait     Functional Status Survey:    Vitals:   12/10/22 1119  BP: 124/71  Pulse: 74  Resp: 18  Temp: 98 F (36.7 C)  SpO2: 93%  Weight: 197 lb 6.4 oz (89.5 kg)  Height: 5\' 11"  (1.803 m)   Body mass index is 27.53 kg/m. Physical Exam Constitutional:      Appearance: Normal appearance.  Skin:    General: Skin is warm and dry.  Neurological:     Mental Status: He is alert and oriented to person, place,  and time. Mental status is at baseline.  Psychiatric:        Mood and Affect: Mood normal.     Labs reviewed: Recent Labs    11/25/22 0330 11/26/22 0330 11/28/22 0253  NA 131* 131* 136  K 4.3 3.7 3.7  CL 97* 98 101  CO2 26 26 26   GLUCOSE 249* 231* 120*  BUN 22 16 17   CREATININE 0.87 0.71 0.74  CALCIUM 8.2* 8.1* 8.5*   Recent Labs    11/23/22 0433  AST 63*  ALT 20  ALKPHOS 61  BILITOT 1.2  PROT 5.2*  ALBUMIN 2.9*   Recent Labs    11/24/22 0356 11/25/22 0330 11/26/22 0330  WBC 12.6* 9.4 8.6  HGB 7.7* 8.7* 8.8*  HCT 25.0* 26.9* 27.6*  MCV 76.2* 74.9* 75.6*  PLT 267 245 273   No results found for: "TSH" Lab Results  Component Value Date   HGBA1C 6.9 (H) 11/22/2022   No results found for: "CHOL", "HDL", "LDLCALC", "LDLDIRECT", "TRIG", "CHOLHDL"  Significant Diagnostic Results in last 30 days:  DG HIP UNILAT WITH PELVIS 2-3 VIEWS RIGHT  Result Date: 11/23/2022 CLINICAL DATA:  Right femoral IM nail placement EXAM: DG HIP (WITH OR WITHOUT PELVIS) 2-3V RIGHT COMPARISON:  None Available. FINDINGS: Multiple intraoperative fluoroscopic spot images are provided. Right intertrochanteric fracture transfixed with a right femoral intramedullary nail and interlocking cannulated  femoral neck screw. Medial displacement of the lesser trochanteric fracture fragment. FLUOROSCOPY TIME:  Radiation Exposure Index: 39.32 mGy IMPRESSION: 1. Right intertrochanteric fracture transfixed with a right femoral intramedullary nail and interlocking cannulated femoral neck screw. Electronically Signed   By: Elige Ko M.D.   On: 11/23/2022 12:52   DG C-Arm 1-60 Min-No Report  Result Date: 11/23/2022 Fluoroscopy was utilized by the requesting physician.  No radiographic interpretation.   CT ANGIO AO+BIFEM W & OR WO CONTRAST  Result Date: 11/22/2022 CLINICAL DATA:  Evaluate for lower extremity ischemia. Status post intertrochanteric fracture of the proximal right femur. Right leg appears cool lower than left. Complains of cramping sensation to the right leg. EXAM: CT ANGIOGRAPHY OF ABDOMINAL AORTA WITH ILIOFEMORAL RUNOFF TECHNIQUE: Multidetector CT imaging of the abdomen, pelvis and lower extremities was performed using the standard protocol during bolus administration of intravenous contrast. Multiplanar CT image reconstructions and MIPs were obtained to evaluate the vascular anatomy. RADIATION DOSE REDUCTION: This exam was performed according to the departmental dose-optimization program which includes automated exposure control, adjustment of the mA and/or kV according to patient size and/or use of iterative reconstruction technique. CONTRAST:  80mL OMNIPAQUE IOHEXOL 350 MG/ML SOLN COMPARISON:  None Available. FINDINGS: VASCULAR Aorta: Normal caliber aorta without aneurysm, dissection, vasculitis or significant stenosis. Extensive aortic atherosclerotic calcifications identified. Celiac: Patent without evidence of aneurysm, dissection, vasculitis or significant stenosis. SMA: Patent without evidence of aneurysm, dissection, vasculitis or significant stenosis. Renals: Both renal arteries are patent without evidence of aneurysm, dissection, vasculitis, fibromuscular dysplasia or significant  stenosis. IMA: Patent without evidence of aneurysm, dissection, vasculitis or significant stenosis. RIGHT Lower Extremity Inflow: Common, internal and external iliac arteries are patent without evidence of aneurysm, dissection, vasculitis or significant stenosis. Outflow: Common, superficial and profunda femoral arteries and the popliteal artery are patent without evidence of aneurysm, dissection, vasculitis or significant stenosis. Runoff: Patent three vessel runoff to the ankle. Multiple areas of calcified and noncalcified eccentric mural plaque identified. Multiple areas of high-grade luminal narrowing identified involving the anterior tibial artery with contrast opacification of the vessel noted to the level  of the ankle. LEFT Lower Extremity Inflow: Common, internal and external iliac arteries are patent without evidence of aneurysm, dissection, vasculitis or significant stenosis. Outflow: Common, superficial and profunda femoral arteries and the popliteal artery are patent without evidence of aneurysm, dissection, vasculitis or significant stenosis. Runoff: Patent three vessel runoff to the ankle. Similar to the contralateral side is extensive atherosclerotic disease involving the lower extremity arterial vessels. Multiple areas of high-grade stenoses distal to the bifurcation of the popliteal artery with distal contrast opacification of the vessels to the level of the ankle. Veins: No obvious venous abnormality within the limitations of this arterial phase study. Review of the MIP images confirms the above findings. NON-VASCULAR Lower chest: No acute abnormality. Hepatobiliary: No focal liver abnormality is seen. No gallstones, gallbladder wall thickening, or biliary dilatation. Pancreas: Unremarkable. No pancreatic ductal dilatation or surrounding inflammatory changes. Spleen: Normal in size without focal abnormality. Adrenals/Urinary Tract: Adrenal glands are unremarkable. Kidneys are normal, without renal  calculi, focal lesion, or hydronephrosis. Bladder is unremarkable. Stomach/Bowel: Small hiatal hernia. The appendix is visualized and appears normal. There is no bowel wall thickening, inflammation, or distension identified. Lymphatic: No signs of abdominopelvic adenopathy. Reproductive: Prostate is unremarkable. Other: No ascites or focal fluid collections. Musculoskeletal: There is an acute and comminuted fracture deformity involving the intertrochanteric portions of the proximal right femur. Small bilateral joint effusions are noted within the suprapatellar joint space of both knees. Moderate to severe medial compartment osteoarthritis is noted within both knees. IMPRESSION: 1. No acute findings within the abdomen or pelvis. 2. Acute and comminuted fracture deformity involving the intertrochanteric portions of the proximal right femur. 3. Extensive atherosclerotic disease involving the lower extremity arterial vessels. Multiple areas of high-grade luminal narrowing identified involving the anterior tibial arteries bilaterally with distal contrast opacification of the vessels to the level of the ankle. 4. Small hiatal hernia. 5. 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 6.  Aortic Atherosclerosis (ICD10-I70.0). Electronically Signed   By: Signa Kell M.D.   On: 11/22/2022 18:58   DG Chest 1 View  Result Date: 11/22/2022 CLINICAL DATA:  Right hip fracture, weakness EXAM: CHEST  1 VIEW COMPARISON:  09/13/2018 chest radiograph. FINDINGS: Intact sternotomy wires. CABG clips overlie the left mediastinum. Stable cardiomediastinal silhouette with normal heart size. No pneumothorax. No pleural effusion. Lungs appear clear, with no acute consolidative airspace disease and no pulmonary edema. IMPRESSION: No active disease. Electronically Signed   By: Delbert Phenix M.D.   On: 11/22/2022 15:15   DG Hip Unilat With Pelvis 2-3 Views Right  Result Date: 11/22/2022 CLINICAL DATA:  Right  lower extremity pain, numbness and weakness EXAM: DG HIP (WITH OR WITHOUT PELVIS) 2-3V RIGHT COMPARISON:  None Available. FINDINGS: Comminuted intertrochanteric proximal right femur fracture with 2 cm overriding of the fracture fragments and 1 cm medial displacement of the lesser trochanter fracture fragment. No dislocation at the right hip joint. No pelvic diastasis. Mild bilateral hip osteoarthritis. No suspicious focal osseous lesions. Mild degenerative changes in the visualized lower lumbar spine. Surgical clip overlies the medial upper right thigh soft tissues. IMPRESSION: 1. Comminuted intertrochanteric proximal right femur fracture as detailed. 2. Mild bilateral hip osteoarthritis. Electronically Signed   By: Delbert Phenix M.D.   On: 11/22/2022 15:14    Assessment/Plan 1. Type 1 diabetes mellitus with retinopathy, macular edema presence unspecified, unspecified laterality, unspecified retinopathy severity (HCC) Blood sugars elevated Since he has been here fasting ranging from 140-347 Other blood sugars ranging from 140-300s  He reports one time episode of feeling low 2 days ago but ate a bite of ice cream and felt better- states staff did not check blood sugar when he was feeling off but felt better after he took his blood sugar.  States insulin has been adjusted prior to coming into facility due to having lows.  Pt would like novolog to 15 units with meals, agreeable to this. Staff to monitor for hypoglycemia and will adjust as needed.    Janene Harvey. Biagio Borg [facility] [phone]

## 2022-12-12 DIAGNOSIS — S72141A Displaced intertrochanteric fracture of right femur, initial encounter for closed fracture: Secondary | ICD-10-CM | POA: Diagnosis not present

## 2022-12-19 ENCOUNTER — Encounter: Payer: Self-pay | Admitting: Nurse Practitioner

## 2022-12-19 ENCOUNTER — Non-Acute Institutional Stay (SKILLED_NURSING_FACILITY): Payer: PPO | Admitting: Nurse Practitioner

## 2022-12-19 DIAGNOSIS — I2510 Atherosclerotic heart disease of native coronary artery without angina pectoris: Secondary | ICD-10-CM

## 2022-12-19 DIAGNOSIS — S728X1D Other fracture of right femur, subsequent encounter for closed fracture with routine healing: Secondary | ICD-10-CM

## 2022-12-19 DIAGNOSIS — L89892 Pressure ulcer of other site, stage 2: Secondary | ICD-10-CM | POA: Diagnosis not present

## 2022-12-19 DIAGNOSIS — I10 Essential (primary) hypertension: Secondary | ICD-10-CM | POA: Diagnosis not present

## 2022-12-19 DIAGNOSIS — E10319 Type 1 diabetes mellitus with unspecified diabetic retinopathy without macular edema: Secondary | ICD-10-CM

## 2022-12-19 DIAGNOSIS — D649 Anemia, unspecified: Secondary | ICD-10-CM | POA: Diagnosis not present

## 2022-12-19 NOTE — Progress Notes (Signed)
Location:  Other [place].  Nursing Home Room Number: [facility] [address] Place of Service:  SNF [id number]) [name], NP  PCP: [name], MD  Patient Care Team: [name], MD as PCP - General (Internal Medicine)  Extended Emergency Contact Information Primary Emergency Contact: Bruce Lucas Mobile Phone: [phone] Relation: Significant other Secondary Emergency Contact: [name],[name] Mobile Phone: [phone] Relation: Daughter  Goals of care: Advanced Directive information    12/10/2022   11:27 AM  Advanced Directives  Does Patient Have a Medical Advance Directive? No  Would patient like information on creating a medical advance directive? No - Patient declined     Chief Complaint  Patient presents with   Discharge Note    Discharge.     HPI:  Pt is a 62 y.o. male seen today for Discharge home. Pt was admitted to [facility] after fall at home which resulted in femur frature. He is s/p nailing on 11/23/22 and now stable to dc home with home health services.  Pts blood sugars with improved control on lantus 15 units. No hypoglycemia episodes. Will continue to follow up with endocrine and PCP on discharge.  No issues with pain.  Bowels moving well.    Past Medical History:  Diagnosis Date   AKI (acute kidney injury) (HCC) 11/23/2022   Coronary artery disease    Diabetes mellitus (HCC)    Type 1   Diabetic peripheral neuropathy (HCC)    Diabetic retinopathy (HCC)    Hyperlipidemia    Hypertension    Hyponatremia 11/23/2022   Sepsis (HCC) 11/23/2022   Stroke (HCC)    06/2018   Past Surgical History:  Procedure Laterality Date   CARDIAC SURGERY     CORONARY ARTERY BYPASS GRAFT     09/03/18   EYE SURGERY     Per patient, for diabetic retinopathy, right eye   INTRAMEDULLARY (IM) NAIL INTERTROCHANTERIC Right 11/23/2022   Procedure: INTRAMEDULLARY (IM) NAIL INTERTROCHANTERIC;  Surgeon: Deeann Saint, MD;  Location: [facility] ORS;  Service:  Orthopedics;  Laterality: Right;   KNEE SURGERY Bilateral    TONSILLECTOMY      Allergies  Allergen Reactions   Codeine    Penicillins     Outpatient Encounter Medications as of 12/19/2022  Medication Sig   acetaminophen (TYLENOL) 325 MG tablet Take 1-2 tablets (325-650 mg total) by mouth every 6 (six) hours as needed for mild pain (pain score 1-3 or temp > 100.5).   alum & mag hydroxide-simeth (MAALOX/MYLANTA) 200-200-20 MG/5ML suspension Take 30 mLs by mouth every 4 (four) hours as needed for indigestion.   aspirin EC 81 MG tablet Take 81 mg by mouth daily.   atorvastatin (LIPITOR) 40 MG tablet Take 1 tablet (40 mg total) by mouth daily.   bisacodyl (DULCOLAX) 10 MG suppository Place 1 suppository (10 mg total) rectally daily as needed for moderate constipation.   cyanocobalamin 1000 MCG tablet Take 1 tablet by mouth daily.   DULoxetine (CYMBALTA) 30 MG capsule Take 30 mg by mouth daily.   ferrous sulfate 325 (65 FE) MG tablet Take 1 tablet (325 mg total) by mouth every other day.   HYDROcodone-acetaminophen (NORCO/VICODIN) 5-325 MG tablet Take 1-2 tablets by mouth every 6 (six) hours as needed for moderate pain.   insulin aspart (NOVOLOG) 100 UNIT/ML injection Inject 15 Units into the skin 3 (three) times daily before meals. Inject as per sliding scale: if 200-249=1 units, 250-299=2 units, 300-349=3 units, 350-400=4 units, 401+call oncall provider   insulin glargine (LANTUS)  100 UNIT/ML injection Inject 15 Units into the skin at bedtime.   losartan (COZAAR) 25 MG tablet Take 12.5 mg by mouth daily.   Melatonin 10 MG TABS Take 10 mg by mouth at bedtime.   metoprolol succinate (TOPROL-XL) 25 MG 24 hr tablet Take 12.5 mg by mouth daily.   Multiple Vitamin (MULTIVITAMIN WITH MINERALS) TABS tablet Take 1 tablet by mouth daily.   nitroGLYCERIN (NITROSTAT) 0.4 MG SL tablet Place 0.4 mg under the tongue every 5 (five) minutes x 3 doses as needed for chest pain.   polyethylene glycol (MIRALAX /  GLYCOLAX) 17 g packet Take 17 g by mouth daily. Hold for loose or frequent stools   senna (SENOKOT) 8.6 MG TABS tablet Take 1 tablet (8.6 mg total) by mouth 2 (two) times daily. Hold for loose or frequent stools   traZODone (DESYREL) 50 MG tablet Take 50 mg by mouth at bedtime.   [DISCONTINUED] HYDROcodone-acetaminophen (NORCO/VICODIN) 5-325 MG tablet Take 1-2 tablets by mouth every 4 (four) hours as needed for moderate pain or severe pain. (Patient taking differently: Take 1-2 tablets by mouth every 6 (six) hours as needed for moderate pain or severe pain.)   [DISCONTINUED] insulin aspart (NOVOLOG) 100 UNIT/ML injection Inject 11 Units into the skin 3 (three) times daily with meals. (Patient taking differently: Inject 15 Units into the skin 3 (three) times daily with meals. Inject as per sliding scale: if 200-249=1 unit, 250-299=2 units, 300-349=3 units, 350-400= 4 units, 401+ Call Provider)   No facility-administered encounter medications on file as of 12/19/2022.    Review of Systems  Constitutional:  Negative for activity change, appetite change, fatigue and unexpected weight change.  HENT:  Negative for congestion and hearing loss.   Eyes: Negative.   Respiratory:  Negative for cough and shortness of breath.   Cardiovascular:  Negative for chest pain, palpitations and leg swelling.  Gastrointestinal:  Negative for abdominal pain, constipation and diarrhea.  Genitourinary:  Negative for difficulty urinating and dysuria.  Musculoskeletal:  Negative for arthralgias and myalgias.  Skin:  Negative for color change and wound.  Neurological:  Negative for dizziness and weakness.  Psychiatric/Behavioral:  Negative for agitation, behavioral problems and confusion.     Immunization History  Administered Date(s) Administered   Pneumococcal Polysaccharide-23 03/06/2012, 10/07/2018   Pertinent  Health Maintenance Due  Topic Date Due   FOOT EXAM  Never done   OPHTHALMOLOGY EXAM  Never done    Colonoscopy  Never done   INFLUENZA VACCINE  Never done   HEMOGLOBIN A1C  05/25/2023      09/13/2018    1:51 PM 10/30/2018   11:09 AM 05/15/2021    1:43 PM 06/25/2021    1:54 PM  Fall Risk  Falls in the past year?  0 0 0  (RETIRED) Patient Fall Risk Level Low fall risk Moderate fall risk Low fall risk   Patient at Risk for Falls Due to  Impaired balance/gait     Functional Status Survey:    Vitals:   12/19/22 0803 12/19/22 0849  BP: (!) 146/69 130/63  Pulse: 90   Resp: 20   Temp: 98 F (36.7 C)   SpO2: 96%   Weight: 195 lb 3.2 oz (88.5 kg)   Height: 5\' 11"  (1.803 m)    Body mass index is 27.22 kg/m. Physical Exam Constitutional:      General: He is not in acute distress.    Appearance: He is well-developed. He is not diaphoretic.  HENT:  Head: Normocephalic and atraumatic.     Right Ear: External ear normal.     Left Ear: External ear normal.     Mouth/Throat:     Pharynx: No oropharyngeal exudate.  Eyes:     Conjunctiva/sclera: Conjunctivae normal.     Pupils: Pupils are equal, round, and reactive to light.  Cardiovascular:     Rate and Rhythm: Normal rate and regular rhythm.     Heart sounds: Normal heart sounds.  Pulmonary:     Effort: Pulmonary effort is normal.     Breath sounds: Normal breath sounds.  Abdominal:     General: Bowel sounds are normal.     Palpations: Abdomen is soft.  Musculoskeletal:        General: No tenderness.     Cervical back: Normal range of motion and neck supple.     Right lower leg: No edema.     Left lower leg: No edema.  Skin:    General: Skin is warm and dry.  Neurological:     Mental Status: He is alert and oriented to person, place, and time.     Labs reviewed: Recent Labs    11/25/22 0330 11/26/22 0330 11/28/22 0253  NA 131* 131* 136  K 4.3 3.7 3.7  CL 97* 98 101  CO2 26 26 26   GLUCOSE 249* 231* 120*  BUN 22 16 17   CREATININE 0.87 0.71 0.74  CALCIUM 8.2* 8.1* 8.5*   Recent Labs    11/23/22 0433  AST  63*  ALT 20  ALKPHOS 61  BILITOT 1.2  PROT 5.2*  ALBUMIN 2.9*   Recent Labs    11/24/22 0356 11/25/22 0330 11/26/22 0330  WBC 12.6* 9.4 8.6  HGB 7.7* 8.7* 8.8*  HCT 25.0* 26.9* 27.6*  MCV 76.2* 74.9* 75.6*  PLT 267 245 273   No results found for: "TSH" Lab Results  Component Value Date   HGBA1C 6.9 (H) 11/22/2022   No results found for: "CHOL", "HDL", "LDLCALC", "LDLDIRECT", "TRIG", "CHOLHDL"  Significant Diagnostic Results in last 30 days:  DG HIP UNILAT WITH PELVIS 2-3 VIEWS RIGHT  Result Date: 11/23/2022 CLINICAL DATA:  Right femoral IM nail placement EXAM: DG HIP (WITH OR WITHOUT PELVIS) 2-3V RIGHT COMPARISON:  None Available. FINDINGS: Multiple intraoperative fluoroscopic spot images are provided. Right intertrochanteric fracture transfixed with a right femoral intramedullary nail and interlocking cannulated femoral neck screw. Medial displacement of the lesser trochanteric fracture fragment. FLUOROSCOPY TIME:  Radiation Exposure Index: 39.32 mGy IMPRESSION: 1. Right intertrochanteric fracture transfixed with a right femoral intramedullary nail and interlocking cannulated femoral neck screw. Electronically Signed   By: Elige Ko M.D.   On: 11/23/2022 12:52   DG C-Arm 1-60 Min-No Report  Result Date: 11/23/2022 Fluoroscopy was utilized by the requesting physician.  No radiographic interpretation.   CT ANGIO AO+BIFEM W & OR WO CONTRAST  Result Date: 11/22/2022 CLINICAL DATA:  Evaluate for lower extremity ischemia. Status post intertrochanteric fracture of the proximal right femur. Right leg appears cool lower than left. Complains of cramping sensation to the right leg. EXAM: CT ANGIOGRAPHY OF ABDOMINAL AORTA WITH ILIOFEMORAL RUNOFF TECHNIQUE: Multidetector CT imaging of the abdomen, pelvis and lower extremities was performed using the standard protocol during bolus administration of intravenous contrast. Multiplanar CT image reconstructions and MIPs were obtained to evaluate  the vascular anatomy. RADIATION DOSE REDUCTION: This exam was performed according to the departmental dose-optimization program which includes automated exposure control, adjustment of the mA and/or kV according to patient  size and/or use of iterative reconstruction technique. CONTRAST:  80mL OMNIPAQUE IOHEXOL 350 MG/ML SOLN COMPARISON:  None Available. FINDINGS: VASCULAR Aorta: Normal caliber aorta without aneurysm, dissection, vasculitis or significant stenosis. Extensive aortic atherosclerotic calcifications identified. Celiac: Patent without evidence of aneurysm, dissection, vasculitis or significant stenosis. SMA: Patent without evidence of aneurysm, dissection, vasculitis or significant stenosis. Renals: Both renal arteries are patent without evidence of aneurysm, dissection, vasculitis, fibromuscular dysplasia or significant stenosis. IMA: Patent without evidence of aneurysm, dissection, vasculitis or significant stenosis. RIGHT Lower Extremity Inflow: Common, internal and external iliac arteries are patent without evidence of aneurysm, dissection, vasculitis or significant stenosis. Outflow: Common, superficial and profunda femoral arteries and the popliteal artery are patent without evidence of aneurysm, dissection, vasculitis or significant stenosis. Runoff: Patent three vessel runoff to the ankle. Multiple areas of calcified and noncalcified eccentric mural plaque identified. Multiple areas of high-grade luminal narrowing identified involving the anterior tibial artery with contrast opacification of the vessel noted to the level of the ankle. LEFT Lower Extremity Inflow: Common, internal and external iliac arteries are patent without evidence of aneurysm, dissection, vasculitis or significant stenosis. Outflow: Common, superficial and profunda femoral arteries and the popliteal artery are patent without evidence of aneurysm, dissection, vasculitis or significant stenosis. Runoff: Patent three vessel  runoff to the ankle. Similar to the contralateral side is extensive atherosclerotic disease involving the lower extremity arterial vessels. Multiple areas of high-grade stenoses distal to the bifurcation of the popliteal artery with distal contrast opacification of the vessels to the level of the ankle. Veins: No obvious venous abnormality within the limitations of this arterial phase study. Review of the MIP images confirms the above findings. NON-VASCULAR Lower chest: No acute abnormality. Hepatobiliary: No focal liver abnormality is seen. No gallstones, gallbladder wall thickening, or biliary dilatation. Pancreas: Unremarkable. No pancreatic ductal dilatation or surrounding inflammatory changes. Spleen: Normal in size without focal abnormality. Adrenals/Urinary Tract: Adrenal glands are unremarkable. Kidneys are normal, without renal calculi, focal lesion, or hydronephrosis. Bladder is unremarkable. Stomach/Bowel: Small hiatal hernia. The appendix is visualized and appears normal. There is no bowel wall thickening, inflammation, or distension identified. Lymphatic: No signs of abdominopelvic adenopathy. Reproductive: Prostate is unremarkable. Other: No ascites or focal fluid collections. Musculoskeletal: There is an acute and comminuted fracture deformity involving the intertrochanteric portions of the proximal right femur. Small bilateral joint effusions are noted within the suprapatellar joint space of both knees. Moderate to severe medial compartment osteoarthritis is noted within both knees. IMPRESSION: 1. No acute findings within the abdomen or pelvis. 2. Acute and comminuted fracture deformity involving the intertrochanteric portions of the proximal right femur. 3. Extensive atherosclerotic disease involving the lower extremity arterial vessels. Multiple areas of high-grade luminal narrowing identified involving the anterior tibial arteries bilaterally with distal contrast opacification of the vessels to  the level of the ankle. 4. Small hiatal hernia. 5. 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 6.  Aortic Atherosclerosis (ICD10-I70.0). Electronically Signed   By: Signa Kell M.D.   On: 11/22/2022 18:58   DG Chest 1 View  Result Date: 11/22/2022 CLINICAL DATA:  Right hip fracture, weakness EXAM: CHEST  1 VIEW COMPARISON:  09/13/2018 chest radiograph. FINDINGS: Intact sternotomy wires. CABG clips overlie the left mediastinum. Stable cardiomediastinal silhouette with normal heart size. No pneumothorax. No pleural effusion. Lungs appear clear, with no acute consolidative airspace disease and no pulmonary edema. IMPRESSION: No active disease. Electronically Signed   By: Delbert Phenix M.D.   On: 11/22/2022  15:15   DG Hip Unilat With Pelvis 2-3 Views Right  Result Date: 11/22/2022 CLINICAL DATA:  Right lower extremity pain, numbness and weakness EXAM: DG HIP (WITH OR WITHOUT PELVIS) 2-3V RIGHT COMPARISON:  None Available. FINDINGS: Comminuted intertrochanteric proximal right femur fracture with 2 cm overriding of the fracture fragments and 1 cm medial displacement of the lesser trochanter fracture fragment. No dislocation at the right hip joint. No pelvic diastasis. Mild bilateral hip osteoarthritis. No suspicious focal osseous lesions. Mild degenerative changes in the visualized lower lumbar spine. Surgical clip overlies the medial upper right thigh soft tissues. IMPRESSION: 1. Comminuted intertrochanteric proximal right femur fracture as detailed. 2. Mild bilateral hip osteoarthritis. Electronically Signed   By: Delbert Phenix M.D.   On: 11/22/2022 15:14    Assessment/Plan 1. Type 1 diabetes mellitus with retinopathy, macular edema presence unspecified, unspecified laterality, unspecified retinopathy severity (HCC) -blood sugars under better control on current regimen. Will continue current dose and plan to follow up with endocrine and PCP  2. CAD, multiple vessel Stable,  without chest pains  Continues on ASA, lipitor, and metoprolol   3. Pressure injury of toe, stage 2, unspecified laterality (HCC) Stable, pt reports he follows with endocrine who follows wounds. Overall healing.   4. Other closed fracture of right femur with routine healing, unspecified portion of femur, subsequent encounter -s/p IM nailing, healing well. Followed by ortho.   5. Essential hypertension -Blood pressure well controlled, goal bp <140/90 Continue current medications and dietary modifications follow metabolic panel  6. Normocytic anemia Will need follow up cbc by PCP Continues on iron  pt is stable for discharge-will need PT/OT per home health. DME needed includes shower chair. medication sent from facility.    Janene Harvey. Biagio Borg [facility] [phone]

## 2022-12-24 DIAGNOSIS — M150 Primary generalized (osteo)arthritis: Secondary | ICD-10-CM | POA: Diagnosis not present

## 2022-12-24 DIAGNOSIS — D6869 Other thrombophilia: Secondary | ICD-10-CM | POA: Diagnosis not present

## 2022-12-24 DIAGNOSIS — I69351 Hemiplegia and hemiparesis following cerebral infarction affecting right dominant side: Secondary | ICD-10-CM | POA: Diagnosis not present

## 2022-12-24 DIAGNOSIS — K449 Diaphragmatic hernia without obstruction or gangrene: Secondary | ICD-10-CM | POA: Diagnosis not present

## 2022-12-24 DIAGNOSIS — Z7982 Long term (current) use of aspirin: Secondary | ICD-10-CM | POA: Diagnosis not present

## 2022-12-24 DIAGNOSIS — Z9181 History of falling: Secondary | ICD-10-CM | POA: Diagnosis not present

## 2022-12-24 DIAGNOSIS — Z951 Presence of aortocoronary bypass graft: Secondary | ICD-10-CM | POA: Diagnosis not present

## 2022-12-24 DIAGNOSIS — I5022 Chronic systolic (congestive) heart failure: Secondary | ICD-10-CM | POA: Diagnosis not present

## 2022-12-24 DIAGNOSIS — I110 Hypertensive heart disease with heart failure: Secondary | ICD-10-CM | POA: Diagnosis not present

## 2022-12-24 DIAGNOSIS — D62 Acute posthemorrhagic anemia: Secondary | ICD-10-CM | POA: Diagnosis not present

## 2022-12-24 DIAGNOSIS — I700 Atherosclerosis of aorta: Secondary | ICD-10-CM | POA: Diagnosis not present

## 2022-12-24 DIAGNOSIS — I2510 Atherosclerotic heart disease of native coronary artery without angina pectoris: Secondary | ICD-10-CM | POA: Diagnosis not present

## 2022-12-24 DIAGNOSIS — E10311 Type 1 diabetes mellitus with unspecified diabetic retinopathy with macular edema: Secondary | ICD-10-CM | POA: Diagnosis not present

## 2022-12-24 DIAGNOSIS — E1040 Type 1 diabetes mellitus with diabetic neuropathy, unspecified: Secondary | ICD-10-CM | POA: Diagnosis not present

## 2022-12-24 DIAGNOSIS — M791 Myalgia, unspecified site: Secondary | ICD-10-CM | POA: Diagnosis not present

## 2022-12-24 DIAGNOSIS — E785 Hyperlipidemia, unspecified: Secondary | ICD-10-CM | POA: Diagnosis not present

## 2022-12-24 DIAGNOSIS — S72141D Displaced intertrochanteric fracture of right femur, subsequent encounter for closed fracture with routine healing: Secondary | ICD-10-CM | POA: Diagnosis not present

## 2022-12-24 DIAGNOSIS — E1051 Type 1 diabetes mellitus with diabetic peripheral angiopathy without gangrene: Secondary | ICD-10-CM | POA: Diagnosis not present

## 2022-12-24 DIAGNOSIS — I70209 Unspecified atherosclerosis of native arteries of extremities, unspecified extremity: Secondary | ICD-10-CM | POA: Diagnosis not present

## 2022-12-24 DIAGNOSIS — R278 Other lack of coordination: Secondary | ICD-10-CM | POA: Diagnosis not present

## 2022-12-31 DIAGNOSIS — I5020 Unspecified systolic (congestive) heart failure: Secondary | ICD-10-CM | POA: Diagnosis not present

## 2022-12-31 DIAGNOSIS — I779 Disorder of arteries and arterioles, unspecified: Secondary | ICD-10-CM | POA: Diagnosis not present

## 2022-12-31 DIAGNOSIS — E109 Type 1 diabetes mellitus without complications: Secondary | ICD-10-CM | POA: Diagnosis not present

## 2022-12-31 DIAGNOSIS — I2510 Atherosclerotic heart disease of native coronary artery without angina pectoris: Secondary | ICD-10-CM | POA: Diagnosis not present

## 2022-12-31 DIAGNOSIS — I10 Essential (primary) hypertension: Secondary | ICD-10-CM | POA: Diagnosis not present

## 2023-01-09 DIAGNOSIS — S72141A Displaced intertrochanteric fracture of right femur, initial encounter for closed fracture: Secondary | ICD-10-CM | POA: Diagnosis not present

## 2023-01-23 DIAGNOSIS — I739 Peripheral vascular disease, unspecified: Secondary | ICD-10-CM | POA: Diagnosis not present

## 2023-01-23 DIAGNOSIS — E1059 Type 1 diabetes mellitus with other circulatory complications: Secondary | ICD-10-CM | POA: Diagnosis not present

## 2023-01-23 DIAGNOSIS — M810 Age-related osteoporosis without current pathological fracture: Secondary | ICD-10-CM | POA: Diagnosis not present

## 2023-01-23 DIAGNOSIS — I10 Essential (primary) hypertension: Secondary | ICD-10-CM | POA: Diagnosis not present

## 2023-01-23 DIAGNOSIS — I2510 Atherosclerotic heart disease of native coronary artery without angina pectoris: Secondary | ICD-10-CM | POA: Diagnosis not present

## 2023-01-23 DIAGNOSIS — Z8781 Personal history of (healed) traumatic fracture: Secondary | ICD-10-CM | POA: Diagnosis not present

## 2023-01-23 DIAGNOSIS — E103293 Type 1 diabetes mellitus with mild nonproliferative diabetic retinopathy without macular edema, bilateral: Secondary | ICD-10-CM | POA: Diagnosis not present

## 2023-01-23 DIAGNOSIS — E10649 Type 1 diabetes mellitus with hypoglycemia without coma: Secondary | ICD-10-CM | POA: Diagnosis not present

## 2023-01-23 DIAGNOSIS — E109 Type 1 diabetes mellitus without complications: Secondary | ICD-10-CM | POA: Diagnosis not present

## 2023-01-24 DIAGNOSIS — E1059 Type 1 diabetes mellitus with other circulatory complications: Secondary | ICD-10-CM | POA: Diagnosis not present

## 2023-01-24 DIAGNOSIS — E10649 Type 1 diabetes mellitus with hypoglycemia without coma: Secondary | ICD-10-CM | POA: Diagnosis not present

## 2023-01-24 DIAGNOSIS — I739 Peripheral vascular disease, unspecified: Secondary | ICD-10-CM | POA: Diagnosis not present

## 2023-01-24 DIAGNOSIS — E109 Type 1 diabetes mellitus without complications: Secondary | ICD-10-CM | POA: Diagnosis not present

## 2023-01-24 DIAGNOSIS — Z8781 Personal history of (healed) traumatic fracture: Secondary | ICD-10-CM | POA: Diagnosis not present

## 2023-01-24 DIAGNOSIS — M810 Age-related osteoporosis without current pathological fracture: Secondary | ICD-10-CM | POA: Diagnosis not present

## 2023-01-24 DIAGNOSIS — E103293 Type 1 diabetes mellitus with mild nonproliferative diabetic retinopathy without macular edema, bilateral: Secondary | ICD-10-CM | POA: Diagnosis not present

## 2023-02-07 DIAGNOSIS — I110 Hypertensive heart disease with heart failure: Secondary | ICD-10-CM | POA: Diagnosis not present

## 2023-02-07 DIAGNOSIS — I5022 Chronic systolic (congestive) heart failure: Secondary | ICD-10-CM | POA: Diagnosis not present

## 2023-02-07 DIAGNOSIS — I2510 Atherosclerotic heart disease of native coronary artery without angina pectoris: Secondary | ICD-10-CM | POA: Diagnosis not present

## 2023-02-07 DIAGNOSIS — S72141D Displaced intertrochanteric fracture of right femur, subsequent encounter for closed fracture with routine healing: Secondary | ICD-10-CM | POA: Diagnosis not present

## 2023-02-07 DIAGNOSIS — I69351 Hemiplegia and hemiparesis following cerebral infarction affecting right dominant side: Secondary | ICD-10-CM | POA: Diagnosis not present

## 2023-02-07 DIAGNOSIS — E1051 Type 1 diabetes mellitus with diabetic peripheral angiopathy without gangrene: Secondary | ICD-10-CM | POA: Diagnosis not present

## 2023-02-07 DIAGNOSIS — I70209 Unspecified atherosclerosis of native arteries of extremities, unspecified extremity: Secondary | ICD-10-CM | POA: Diagnosis not present

## 2023-02-20 DIAGNOSIS — S72141A Displaced intertrochanteric fracture of right femur, initial encounter for closed fracture: Secondary | ICD-10-CM | POA: Diagnosis not present

## 2023-02-25 DIAGNOSIS — I10 Essential (primary) hypertension: Secondary | ICD-10-CM | POA: Diagnosis not present

## 2023-02-25 DIAGNOSIS — I779 Disorder of arteries and arterioles, unspecified: Secondary | ICD-10-CM | POA: Diagnosis not present

## 2023-02-25 DIAGNOSIS — I2510 Atherosclerotic heart disease of native coronary artery without angina pectoris: Secondary | ICD-10-CM | POA: Diagnosis not present

## 2023-02-25 DIAGNOSIS — R0602 Shortness of breath: Secondary | ICD-10-CM | POA: Diagnosis not present

## 2023-02-25 DIAGNOSIS — I739 Peripheral vascular disease, unspecified: Secondary | ICD-10-CM | POA: Diagnosis not present

## 2023-02-25 DIAGNOSIS — I5020 Unspecified systolic (congestive) heart failure: Secondary | ICD-10-CM | POA: Diagnosis not present

## 2023-02-26 DIAGNOSIS — G720 Drug-induced myopathy: Secondary | ICD-10-CM | POA: Diagnosis not present

## 2023-02-26 DIAGNOSIS — I10 Essential (primary) hypertension: Secondary | ICD-10-CM | POA: Diagnosis not present

## 2023-02-26 DIAGNOSIS — I779 Disorder of arteries and arterioles, unspecified: Secondary | ICD-10-CM | POA: Diagnosis not present

## 2023-02-26 DIAGNOSIS — I5020 Unspecified systolic (congestive) heart failure: Secondary | ICD-10-CM | POA: Diagnosis not present

## 2023-02-26 DIAGNOSIS — Z2821 Immunization not carried out because of patient refusal: Secondary | ICD-10-CM | POA: Diagnosis not present

## 2023-02-26 DIAGNOSIS — T466X5A Adverse effect of antihyperlipidemic and antiarteriosclerotic drugs, initial encounter: Secondary | ICD-10-CM | POA: Diagnosis not present

## 2023-02-26 DIAGNOSIS — E109 Type 1 diabetes mellitus without complications: Secondary | ICD-10-CM | POA: Diagnosis not present

## 2023-03-13 DIAGNOSIS — M25551 Pain in right hip: Secondary | ICD-10-CM | POA: Diagnosis not present

## 2023-03-13 DIAGNOSIS — M25651 Stiffness of right hip, not elsewhere classified: Secondary | ICD-10-CM | POA: Diagnosis not present

## 2023-03-19 DIAGNOSIS — M25551 Pain in right hip: Secondary | ICD-10-CM | POA: Diagnosis not present

## 2023-03-19 DIAGNOSIS — I5020 Unspecified systolic (congestive) heart failure: Secondary | ICD-10-CM | POA: Diagnosis not present

## 2023-03-19 DIAGNOSIS — R0602 Shortness of breath: Secondary | ICD-10-CM | POA: Diagnosis not present

## 2023-03-19 DIAGNOSIS — M25651 Stiffness of right hip, not elsewhere classified: Secondary | ICD-10-CM | POA: Diagnosis not present

## 2023-03-24 DIAGNOSIS — H52223 Regular astigmatism, bilateral: Secondary | ICD-10-CM | POA: Diagnosis not present

## 2023-03-24 DIAGNOSIS — H5213 Myopia, bilateral: Secondary | ICD-10-CM | POA: Diagnosis not present

## 2023-03-24 DIAGNOSIS — H2513 Age-related nuclear cataract, bilateral: Secondary | ICD-10-CM | POA: Diagnosis not present

## 2023-03-24 DIAGNOSIS — Z9889 Other specified postprocedural states: Secondary | ICD-10-CM | POA: Diagnosis not present

## 2023-03-24 DIAGNOSIS — E113292 Type 2 diabetes mellitus with mild nonproliferative diabetic retinopathy without macular edema, left eye: Secondary | ICD-10-CM | POA: Diagnosis not present

## 2023-03-26 DIAGNOSIS — M25651 Stiffness of right hip, not elsewhere classified: Secondary | ICD-10-CM | POA: Diagnosis not present

## 2023-03-26 DIAGNOSIS — M25551 Pain in right hip: Secondary | ICD-10-CM | POA: Diagnosis not present

## 2023-03-28 DIAGNOSIS — M25551 Pain in right hip: Secondary | ICD-10-CM | POA: Diagnosis not present

## 2023-03-28 DIAGNOSIS — M25651 Stiffness of right hip, not elsewhere classified: Secondary | ICD-10-CM | POA: Diagnosis not present

## 2023-04-03 DIAGNOSIS — S72141A Displaced intertrochanteric fracture of right femur, initial encounter for closed fracture: Secondary | ICD-10-CM | POA: Diagnosis not present

## 2023-04-16 DIAGNOSIS — M25551 Pain in right hip: Secondary | ICD-10-CM | POA: Diagnosis not present

## 2023-04-16 DIAGNOSIS — M25651 Stiffness of right hip, not elsewhere classified: Secondary | ICD-10-CM | POA: Diagnosis not present

## 2023-04-18 DIAGNOSIS — M25551 Pain in right hip: Secondary | ICD-10-CM | POA: Diagnosis not present

## 2023-04-18 DIAGNOSIS — M25651 Stiffness of right hip, not elsewhere classified: Secondary | ICD-10-CM | POA: Diagnosis not present

## 2023-04-23 DIAGNOSIS — M25651 Stiffness of right hip, not elsewhere classified: Secondary | ICD-10-CM | POA: Diagnosis not present

## 2023-04-23 DIAGNOSIS — M25551 Pain in right hip: Secondary | ICD-10-CM | POA: Diagnosis not present

## 2023-04-25 DIAGNOSIS — M25651 Stiffness of right hip, not elsewhere classified: Secondary | ICD-10-CM | POA: Diagnosis not present

## 2023-04-25 DIAGNOSIS — M25551 Pain in right hip: Secondary | ICD-10-CM | POA: Diagnosis not present

## 2023-04-28 DIAGNOSIS — I779 Disorder of arteries and arterioles, unspecified: Secondary | ICD-10-CM | POA: Diagnosis not present

## 2023-04-28 DIAGNOSIS — M810 Age-related osteoporosis without current pathological fracture: Secondary | ICD-10-CM | POA: Diagnosis not present

## 2023-04-28 DIAGNOSIS — I10 Essential (primary) hypertension: Secondary | ICD-10-CM | POA: Diagnosis not present

## 2023-04-28 DIAGNOSIS — I5020 Unspecified systolic (congestive) heart failure: Secondary | ICD-10-CM | POA: Diagnosis not present

## 2023-04-28 DIAGNOSIS — I2510 Atherosclerotic heart disease of native coronary artery without angina pectoris: Secondary | ICD-10-CM | POA: Diagnosis not present

## 2023-04-28 DIAGNOSIS — I739 Peripheral vascular disease, unspecified: Secondary | ICD-10-CM | POA: Diagnosis not present

## 2023-04-28 DIAGNOSIS — E1059 Type 1 diabetes mellitus with other circulatory complications: Secondary | ICD-10-CM | POA: Diagnosis not present

## 2023-05-02 DIAGNOSIS — M25651 Stiffness of right hip, not elsewhere classified: Secondary | ICD-10-CM | POA: Diagnosis not present

## 2023-05-02 DIAGNOSIS — M25551 Pain in right hip: Secondary | ICD-10-CM | POA: Diagnosis not present

## 2023-05-05 DIAGNOSIS — E109 Type 1 diabetes mellitus without complications: Secondary | ICD-10-CM | POA: Diagnosis not present

## 2023-05-05 DIAGNOSIS — E1059 Type 1 diabetes mellitus with other circulatory complications: Secondary | ICD-10-CM | POA: Diagnosis not present

## 2023-05-05 DIAGNOSIS — E10649 Type 1 diabetes mellitus with hypoglycemia without coma: Secondary | ICD-10-CM | POA: Diagnosis not present

## 2023-05-05 DIAGNOSIS — M810 Age-related osteoporosis without current pathological fracture: Secondary | ICD-10-CM | POA: Diagnosis not present

## 2023-05-05 DIAGNOSIS — Z8781 Personal history of (healed) traumatic fracture: Secondary | ICD-10-CM | POA: Diagnosis not present

## 2023-05-05 DIAGNOSIS — E103293 Type 1 diabetes mellitus with mild nonproliferative diabetic retinopathy without macular edema, bilateral: Secondary | ICD-10-CM | POA: Diagnosis not present

## 2023-05-07 DIAGNOSIS — M25551 Pain in right hip: Secondary | ICD-10-CM | POA: Diagnosis not present

## 2023-05-07 DIAGNOSIS — M25651 Stiffness of right hip, not elsewhere classified: Secondary | ICD-10-CM | POA: Diagnosis not present

## 2023-05-09 DIAGNOSIS — M25551 Pain in right hip: Secondary | ICD-10-CM | POA: Diagnosis not present

## 2023-05-09 DIAGNOSIS — M25651 Stiffness of right hip, not elsewhere classified: Secondary | ICD-10-CM | POA: Diagnosis not present

## 2023-05-14 DIAGNOSIS — M25551 Pain in right hip: Secondary | ICD-10-CM | POA: Diagnosis not present

## 2023-05-14 DIAGNOSIS — M25651 Stiffness of right hip, not elsewhere classified: Secondary | ICD-10-CM | POA: Diagnosis not present

## 2023-05-15 DIAGNOSIS — S72141A Displaced intertrochanteric fracture of right femur, initial encounter for closed fracture: Secondary | ICD-10-CM | POA: Diagnosis not present

## 2023-05-15 DIAGNOSIS — M7741 Metatarsalgia, right foot: Secondary | ICD-10-CM | POA: Diagnosis not present

## 2023-06-26 DIAGNOSIS — M7741 Metatarsalgia, right foot: Secondary | ICD-10-CM | POA: Diagnosis not present

## 2023-07-28 DIAGNOSIS — I2510 Atherosclerotic heart disease of native coronary artery without angina pectoris: Secondary | ICD-10-CM | POA: Diagnosis not present

## 2023-07-28 DIAGNOSIS — I10 Essential (primary) hypertension: Secondary | ICD-10-CM | POA: Diagnosis not present

## 2023-07-28 DIAGNOSIS — I5020 Unspecified systolic (congestive) heart failure: Secondary | ICD-10-CM | POA: Diagnosis not present

## 2023-08-12 DIAGNOSIS — M25551 Pain in right hip: Secondary | ICD-10-CM | POA: Diagnosis not present

## 2023-08-12 DIAGNOSIS — M25571 Pain in right ankle and joints of right foot: Secondary | ICD-10-CM | POA: Diagnosis not present

## 2023-08-28 DIAGNOSIS — M25551 Pain in right hip: Secondary | ICD-10-CM | POA: Diagnosis not present

## 2023-08-28 DIAGNOSIS — M25571 Pain in right ankle and joints of right foot: Secondary | ICD-10-CM | POA: Diagnosis not present

## 2023-09-04 DIAGNOSIS — M25571 Pain in right ankle and joints of right foot: Secondary | ICD-10-CM | POA: Diagnosis not present

## 2023-09-04 DIAGNOSIS — M25551 Pain in right hip: Secondary | ICD-10-CM | POA: Diagnosis not present

## 2023-09-09 DIAGNOSIS — M25551 Pain in right hip: Secondary | ICD-10-CM | POA: Diagnosis not present

## 2023-09-09 DIAGNOSIS — M25571 Pain in right ankle and joints of right foot: Secondary | ICD-10-CM | POA: Diagnosis not present

## 2023-09-11 DIAGNOSIS — Z1211 Encounter for screening for malignant neoplasm of colon: Secondary | ICD-10-CM | POA: Diagnosis not present

## 2023-09-11 DIAGNOSIS — Z1331 Encounter for screening for depression: Secondary | ICD-10-CM | POA: Diagnosis not present

## 2023-09-11 DIAGNOSIS — I5020 Unspecified systolic (congestive) heart failure: Secondary | ICD-10-CM | POA: Diagnosis not present

## 2023-09-11 DIAGNOSIS — E109 Type 1 diabetes mellitus without complications: Secondary | ICD-10-CM | POA: Diagnosis not present

## 2023-09-11 DIAGNOSIS — I2510 Atherosclerotic heart disease of native coronary artery without angina pectoris: Secondary | ICD-10-CM | POA: Diagnosis not present

## 2023-09-11 DIAGNOSIS — T466X5A Adverse effect of antihyperlipidemic and antiarteriosclerotic drugs, initial encounter: Secondary | ICD-10-CM | POA: Diagnosis not present

## 2023-09-11 DIAGNOSIS — E1059 Type 1 diabetes mellitus with other circulatory complications: Secondary | ICD-10-CM | POA: Diagnosis not present

## 2023-09-11 DIAGNOSIS — Z0000 Encounter for general adult medical examination without abnormal findings: Secondary | ICD-10-CM | POA: Diagnosis not present

## 2023-09-11 DIAGNOSIS — E10649 Type 1 diabetes mellitus with hypoglycemia without coma: Secondary | ICD-10-CM | POA: Diagnosis not present

## 2023-09-11 DIAGNOSIS — G720 Drug-induced myopathy: Secondary | ICD-10-CM | POA: Diagnosis not present

## 2023-09-11 DIAGNOSIS — M810 Age-related osteoporosis without current pathological fracture: Secondary | ICD-10-CM | POA: Diagnosis not present

## 2023-09-11 DIAGNOSIS — Z8781 Personal history of (healed) traumatic fracture: Secondary | ICD-10-CM | POA: Diagnosis not present

## 2023-09-11 DIAGNOSIS — E103293 Type 1 diabetes mellitus with mild nonproliferative diabetic retinopathy without macular edema, bilateral: Secondary | ICD-10-CM | POA: Diagnosis not present

## 2023-09-16 DIAGNOSIS — M25571 Pain in right ankle and joints of right foot: Secondary | ICD-10-CM | POA: Diagnosis not present

## 2023-09-16 DIAGNOSIS — M25551 Pain in right hip: Secondary | ICD-10-CM | POA: Diagnosis not present

## 2023-09-22 DIAGNOSIS — H2513 Age-related nuclear cataract, bilateral: Secondary | ICD-10-CM | POA: Diagnosis not present

## 2023-09-22 DIAGNOSIS — E113292 Type 2 diabetes mellitus with mild nonproliferative diabetic retinopathy without macular edema, left eye: Secondary | ICD-10-CM | POA: Diagnosis not present

## 2023-09-22 DIAGNOSIS — Z9889 Other specified postprocedural states: Secondary | ICD-10-CM | POA: Diagnosis not present

## 2023-09-23 DIAGNOSIS — M25571 Pain in right ankle and joints of right foot: Secondary | ICD-10-CM | POA: Diagnosis not present

## 2023-09-23 DIAGNOSIS — M25551 Pain in right hip: Secondary | ICD-10-CM | POA: Diagnosis not present

## 2023-09-30 DIAGNOSIS — M25571 Pain in right ankle and joints of right foot: Secondary | ICD-10-CM | POA: Diagnosis not present

## 2023-09-30 DIAGNOSIS — M25551 Pain in right hip: Secondary | ICD-10-CM | POA: Diagnosis not present

## 2023-10-07 DIAGNOSIS — M25551 Pain in right hip: Secondary | ICD-10-CM | POA: Diagnosis not present

## 2023-10-07 DIAGNOSIS — M25571 Pain in right ankle and joints of right foot: Secondary | ICD-10-CM | POA: Diagnosis not present

## 2023-10-14 DIAGNOSIS — Z1211 Encounter for screening for malignant neoplasm of colon: Secondary | ICD-10-CM | POA: Diagnosis not present

## 2023-10-23 LAB — COLOGUARD: COLOGUARD: NEGATIVE

## 2023-12-11 DIAGNOSIS — I5020 Unspecified systolic (congestive) heart failure: Secondary | ICD-10-CM | POA: Diagnosis not present

## 2023-12-11 DIAGNOSIS — E109 Type 1 diabetes mellitus without complications: Secondary | ICD-10-CM | POA: Diagnosis not present

## 2023-12-11 DIAGNOSIS — I2510 Atherosclerotic heart disease of native coronary artery without angina pectoris: Secondary | ICD-10-CM | POA: Diagnosis not present

## 2023-12-11 DIAGNOSIS — E1059 Type 1 diabetes mellitus with other circulatory complications: Secondary | ICD-10-CM | POA: Diagnosis not present

## 2023-12-16 DIAGNOSIS — E109 Type 1 diabetes mellitus without complications: Secondary | ICD-10-CM | POA: Diagnosis not present

## 2023-12-16 DIAGNOSIS — E1059 Type 1 diabetes mellitus with other circulatory complications: Secondary | ICD-10-CM | POA: Diagnosis not present

## 2023-12-16 DIAGNOSIS — M2040 Other hammer toe(s) (acquired), unspecified foot: Secondary | ICD-10-CM | POA: Diagnosis not present

## 2023-12-16 DIAGNOSIS — E103293 Type 1 diabetes mellitus with mild nonproliferative diabetic retinopathy without macular edema, bilateral: Secondary | ICD-10-CM | POA: Diagnosis not present

## 2023-12-16 DIAGNOSIS — Z8781 Personal history of (healed) traumatic fracture: Secondary | ICD-10-CM | POA: Diagnosis not present

## 2023-12-16 DIAGNOSIS — E10649 Type 1 diabetes mellitus with hypoglycemia without coma: Secondary | ICD-10-CM | POA: Diagnosis not present

## 2023-12-16 DIAGNOSIS — M810 Age-related osteoporosis without current pathological fracture: Secondary | ICD-10-CM | POA: Diagnosis not present

## 2023-12-31 DIAGNOSIS — M2042 Other hammer toe(s) (acquired), left foot: Secondary | ICD-10-CM | POA: Diagnosis not present

## 2023-12-31 DIAGNOSIS — L603 Nail dystrophy: Secondary | ICD-10-CM | POA: Diagnosis not present

## 2023-12-31 DIAGNOSIS — E1051 Type 1 diabetes mellitus with diabetic peripheral angiopathy without gangrene: Secondary | ICD-10-CM | POA: Diagnosis not present

## 2023-12-31 DIAGNOSIS — M2041 Other hammer toe(s) (acquired), right foot: Secondary | ICD-10-CM | POA: Diagnosis not present

## 2024-01-28 DIAGNOSIS — I10 Essential (primary) hypertension: Secondary | ICD-10-CM | POA: Diagnosis not present

## 2024-01-28 DIAGNOSIS — I739 Peripheral vascular disease, unspecified: Secondary | ICD-10-CM | POA: Diagnosis not present

## 2024-01-28 DIAGNOSIS — R0602 Shortness of breath: Secondary | ICD-10-CM | POA: Diagnosis not present

## 2024-01-28 DIAGNOSIS — I5020 Unspecified systolic (congestive) heart failure: Secondary | ICD-10-CM | POA: Diagnosis not present

## 2024-01-28 DIAGNOSIS — I2510 Atherosclerotic heart disease of native coronary artery without angina pectoris: Secondary | ICD-10-CM | POA: Diagnosis not present

## 2024-01-28 DIAGNOSIS — I779 Disorder of arteries and arterioles, unspecified: Secondary | ICD-10-CM | POA: Diagnosis not present

## 2024-03-09 DIAGNOSIS — E10649 Type 1 diabetes mellitus with hypoglycemia without coma: Secondary | ICD-10-CM | POA: Diagnosis not present

## 2024-03-12 NOTE — Progress Notes (Signed)
 Bruce Lucas                                          MRN: [id number]   03/12/2024   The [facility] Quality Team Specialist reviewed this patient medical record for the purposes of chart review for care gap closure. The following were reviewed: chart review for care gap closure-controlling blood pressure.    [facility] Quality Team

## 2024-03-16 DIAGNOSIS — I2510 Atherosclerotic heart disease of native coronary artery without angina pectoris: Secondary | ICD-10-CM | POA: Diagnosis not present

## 2024-03-16 DIAGNOSIS — E103293 Type 1 diabetes mellitus with mild nonproliferative diabetic retinopathy without macular edema, bilateral: Secondary | ICD-10-CM | POA: Diagnosis not present

## 2024-03-16 DIAGNOSIS — I10 Essential (primary) hypertension: Secondary | ICD-10-CM | POA: Diagnosis not present

## 2024-03-16 DIAGNOSIS — I5020 Unspecified systolic (congestive) heart failure: Secondary | ICD-10-CM | POA: Diagnosis not present

## 2024-03-16 DIAGNOSIS — G720 Drug-induced myopathy: Secondary | ICD-10-CM | POA: Diagnosis not present

## 2024-03-16 DIAGNOSIS — E10649 Type 1 diabetes mellitus with hypoglycemia without coma: Secondary | ICD-10-CM | POA: Diagnosis not present

## 2024-03-16 DIAGNOSIS — E109 Type 1 diabetes mellitus without complications: Secondary | ICD-10-CM | POA: Diagnosis not present

## 2024-03-16 DIAGNOSIS — Z8781 Personal history of (healed) traumatic fracture: Secondary | ICD-10-CM | POA: Diagnosis not present

## 2024-03-16 DIAGNOSIS — T466X5A Adverse effect of antihyperlipidemic and antiarteriosclerotic drugs, initial encounter: Secondary | ICD-10-CM | POA: Diagnosis not present

## 2024-03-16 DIAGNOSIS — M810 Age-related osteoporosis without current pathological fracture: Secondary | ICD-10-CM | POA: Diagnosis not present

## 2024-03-16 DIAGNOSIS — E1059 Type 1 diabetes mellitus with other circulatory complications: Secondary | ICD-10-CM | POA: Diagnosis not present

## 2024-04-16 ENCOUNTER — Other Ambulatory Visit: Payer: Self-pay
